# Patient Record
Sex: Female | Born: 1960 | Race: White | Hispanic: No | Smoking: Current every day smoker
Health system: Southern US, Community
[De-identification: ages and names within clinical notes are randomized; demographics above are authoritative.]

## PROBLEM LIST (undated history)

## (undated) DIAGNOSIS — F329 Major depressive disorder, single episode, unspecified: Secondary | ICD-10-CM

## (undated) DIAGNOSIS — K449 Diaphragmatic hernia without obstruction or gangrene: Secondary | ICD-10-CM

## (undated) DIAGNOSIS — G9332 Myalgic encephalomyelitis/chronic fatigue syndrome: Secondary | ICD-10-CM

## (undated) DIAGNOSIS — G608 Other hereditary and idiopathic neuropathies: Secondary | ICD-10-CM

## (undated) DIAGNOSIS — G4700 Insomnia, unspecified: Secondary | ICD-10-CM

## (undated) DIAGNOSIS — J9811 Atelectasis: Secondary | ICD-10-CM

## (undated) DIAGNOSIS — T7840XA Allergy, unspecified, initial encounter: Secondary | ICD-10-CM

## (undated) DIAGNOSIS — J40 Bronchitis, not specified as acute or chronic: Secondary | ICD-10-CM

## (undated) DIAGNOSIS — M8580 Other specified disorders of bone density and structure, unspecified site: Secondary | ICD-10-CM

## (undated) DIAGNOSIS — M533 Sacrococcygeal disorders, not elsewhere classified: Secondary | ICD-10-CM

## (undated) DIAGNOSIS — L309 Dermatitis, unspecified: Secondary | ICD-10-CM

## (undated) DIAGNOSIS — K219 Gastro-esophageal reflux disease without esophagitis: Secondary | ICD-10-CM

## (undated) DIAGNOSIS — G629 Polyneuropathy, unspecified: Secondary | ICD-10-CM

## (undated) DIAGNOSIS — M542 Cervicalgia: Secondary | ICD-10-CM

## (undated) DIAGNOSIS — M4802 Spinal stenosis, cervical region: Secondary | ICD-10-CM

## (undated) DIAGNOSIS — N390 Urinary tract infection, site not specified: Secondary | ICD-10-CM

## (undated) DIAGNOSIS — G3184 Mild cognitive impairment, so stated: Secondary | ICD-10-CM

## (undated) DIAGNOSIS — K529 Noninfective gastroenteritis and colitis, unspecified: Secondary | ICD-10-CM

## (undated) DIAGNOSIS — Z9889 Other specified postprocedural states: Secondary | ICD-10-CM

## (undated) DIAGNOSIS — F410 Panic disorder [episodic paroxysmal anxiety] without agoraphobia: Secondary | ICD-10-CM

## (undated) DIAGNOSIS — R928 Other abnormal and inconclusive findings on diagnostic imaging of breast: Secondary | ICD-10-CM

## (undated) DIAGNOSIS — F419 Anxiety disorder, unspecified: Secondary | ICD-10-CM

## (undated) DIAGNOSIS — D759 Disease of blood and blood-forming organs, unspecified: Secondary | ICD-10-CM

## (undated) DIAGNOSIS — M791 Myalgia, unspecified site: Secondary | ICD-10-CM

## (undated) DIAGNOSIS — T56891A Toxic effect of other metals, accidental (unintentional), initial encounter: Secondary | ICD-10-CM

## (undated) DIAGNOSIS — Z972 Presence of dental prosthetic device (complete) (partial): Secondary | ICD-10-CM

## (undated) DIAGNOSIS — R091 Pleurisy: Secondary | ICD-10-CM

## (undated) DIAGNOSIS — G43719 Chronic migraine without aura, intractable, without status migrainosus: Secondary | ICD-10-CM

## (undated) DIAGNOSIS — E559 Vitamin D deficiency, unspecified: Secondary | ICD-10-CM

## (undated) DIAGNOSIS — R519 Headache, unspecified: Secondary | ICD-10-CM

## (undated) DIAGNOSIS — N6001 Solitary cyst of right breast: Secondary | ICD-10-CM

## (undated) DIAGNOSIS — R51 Headache: Secondary | ICD-10-CM

## (undated) DIAGNOSIS — G4730 Sleep apnea, unspecified: Secondary | ICD-10-CM

## (undated) DIAGNOSIS — I10 Essential (primary) hypertension: Secondary | ICD-10-CM

## (undated) DIAGNOSIS — I739 Peripheral vascular disease, unspecified: Secondary | ICD-10-CM

## (undated) DIAGNOSIS — R296 Repeated falls: Secondary | ICD-10-CM

## (undated) DIAGNOSIS — M47812 Spondylosis without myelopathy or radiculopathy, cervical region: Secondary | ICD-10-CM

## (undated) DIAGNOSIS — R5382 Chronic fatigue, unspecified: Secondary | ICD-10-CM

## (undated) DIAGNOSIS — M51369 Other intervertebral disc degeneration, lumbar region without mention of lumbar back pain or lower extremity pain: Secondary | ICD-10-CM

## (undated) DIAGNOSIS — I890 Lymphedema, not elsewhere classified: Secondary | ICD-10-CM

## (undated) DIAGNOSIS — G251 Drug-induced tremor: Secondary | ICD-10-CM

## (undated) DIAGNOSIS — N926 Irregular menstruation, unspecified: Secondary | ICD-10-CM

## (undated) DIAGNOSIS — J189 Pneumonia, unspecified organism: Secondary | ICD-10-CM

## (undated) DIAGNOSIS — F32A Depression, unspecified: Secondary | ICD-10-CM

## (undated) DIAGNOSIS — Z720 Tobacco use: Secondary | ICD-10-CM

## (undated) DIAGNOSIS — M47816 Spondylosis without myelopathy or radiculopathy, lumbar region: Secondary | ICD-10-CM

## (undated) DIAGNOSIS — M1990 Unspecified osteoarthritis, unspecified site: Secondary | ICD-10-CM

## (undated) DIAGNOSIS — Z8489 Family history of other specified conditions: Secondary | ICD-10-CM

## (undated) DIAGNOSIS — L409 Psoriasis, unspecified: Secondary | ICD-10-CM

## (undated) DIAGNOSIS — I872 Venous insufficiency (chronic) (peripheral): Secondary | ICD-10-CM

## (undated) DIAGNOSIS — F17210 Nicotine dependence, cigarettes, uncomplicated: Secondary | ICD-10-CM

## (undated) DIAGNOSIS — F319 Bipolar disorder, unspecified: Secondary | ICD-10-CM

## (undated) DIAGNOSIS — F1020 Alcohol dependence, uncomplicated: Secondary | ICD-10-CM

## (undated) DIAGNOSIS — G8929 Other chronic pain: Secondary | ICD-10-CM

## (undated) DIAGNOSIS — I70219 Atherosclerosis of native arteries of extremities with intermittent claudication, unspecified extremity: Secondary | ICD-10-CM

## (undated) DIAGNOSIS — E785 Hyperlipidemia, unspecified: Secondary | ICD-10-CM

## (undated) DIAGNOSIS — D649 Anemia, unspecified: Secondary | ICD-10-CM

## (undated) DIAGNOSIS — M797 Fibromyalgia: Secondary | ICD-10-CM

## (undated) DIAGNOSIS — R0600 Dyspnea, unspecified: Secondary | ICD-10-CM

## (undated) DIAGNOSIS — J449 Chronic obstructive pulmonary disease, unspecified: Secondary | ICD-10-CM

## (undated) DIAGNOSIS — E039 Hypothyroidism, unspecified: Secondary | ICD-10-CM

## (undated) DIAGNOSIS — E538 Deficiency of other specified B group vitamins: Secondary | ICD-10-CM

## (undated) DIAGNOSIS — N6002 Solitary cyst of left breast: Secondary | ICD-10-CM

## (undated) DIAGNOSIS — R053 Chronic cough: Secondary | ICD-10-CM

## (undated) HISTORY — PX: BACK SURGERY: SHX140

## (undated) HISTORY — DX: Anxiety disorder, unspecified: F41.9

## (undated) HISTORY — PX: NASAL SINUS SURGERY: SHX719

## (undated) HISTORY — PX: COLONOSCOPY: SHX174

## (undated) HISTORY — PX: OOPHORECTOMY: SHX86

## (undated) HISTORY — PX: OTHER SURGICAL HISTORY: SHX169

## (undated) HISTORY — PX: NECK SURGERY: SHX720

## (undated) HISTORY — PX: ADENOIDECTOMY: SUR15

## (undated) HISTORY — DX: Urinary tract infection, site not specified: N39.0

## (undated) HISTORY — DX: Hyperlipidemia, unspecified: E78.5

## (undated) HISTORY — DX: Unspecified osteoarthritis, unspecified site: M19.90

## (undated) HISTORY — PX: SPINE SURGERY: SHX786

## (undated) HISTORY — DX: Bipolar disorder, unspecified: F31.9

## (undated) HISTORY — DX: Other abnormal and inconclusive findings on diagnostic imaging of breast: R92.8

## (undated) HISTORY — DX: Gastro-esophageal reflux disease without esophagitis: K21.9

## (undated) HISTORY — PX: TONSILLECTOMY: SUR1361

## (undated) HISTORY — DX: Chronic obstructive pulmonary disease, unspecified: J44.9

## (undated) HISTORY — DX: Other specified disorders of bone density and structure, unspecified site: M85.80

## (undated) HISTORY — PX: DILATION AND CURETTAGE OF UTERUS: SHX78

## (undated) HISTORY — DX: Major depressive disorder, single episode, unspecified: F32.9

## (undated) HISTORY — DX: Depression, unspecified: F32.A

## (undated) HISTORY — PX: ESOPHAGOGASTRODUODENOSCOPY: SHX1529

## (undated) HISTORY — PX: BREAST BIOPSY: SHX20

---

## 1994-08-29 HISTORY — PX: ABDOMINAL HYSTERECTOMY: SHX81

## 1996-08-29 HISTORY — PX: KNEE SURGERY: SHX244

## 1999-12-22 ENCOUNTER — Encounter: Payer: Self-pay | Admitting: Neurosurgery

## 1999-12-24 ENCOUNTER — Encounter: Payer: Self-pay | Admitting: Neurosurgery

## 1999-12-24 ENCOUNTER — Observation Stay (HOSPITAL_COMMUNITY): Admission: RE | Admit: 1999-12-24 | Discharge: 1999-12-25 | Payer: Self-pay | Admitting: Neurosurgery

## 2000-01-13 ENCOUNTER — Encounter: Admission: RE | Admit: 2000-01-13 | Discharge: 2000-01-13 | Payer: Self-pay | Admitting: Neurosurgery

## 2000-01-13 ENCOUNTER — Encounter: Payer: Self-pay | Admitting: Neurosurgery

## 2000-02-15 ENCOUNTER — Encounter: Payer: Self-pay | Admitting: Neurosurgery

## 2000-02-15 ENCOUNTER — Encounter: Admission: RE | Admit: 2000-02-15 | Discharge: 2000-02-15 | Payer: Self-pay | Admitting: Neurosurgery

## 2000-03-16 ENCOUNTER — Encounter: Payer: Self-pay | Admitting: Neurosurgery

## 2000-03-16 ENCOUNTER — Encounter: Admission: RE | Admit: 2000-03-16 | Discharge: 2000-03-16 | Payer: Self-pay | Admitting: Neurosurgery

## 2000-06-26 ENCOUNTER — Encounter: Payer: Self-pay | Admitting: Neurosurgery

## 2000-06-26 ENCOUNTER — Encounter: Admission: RE | Admit: 2000-06-26 | Discharge: 2000-06-26 | Payer: Self-pay | Admitting: Neurosurgery

## 2001-03-05 ENCOUNTER — Encounter: Payer: Self-pay | Admitting: Neurosurgery

## 2001-03-05 ENCOUNTER — Encounter: Admission: RE | Admit: 2001-03-05 | Discharge: 2001-03-05 | Payer: Self-pay | Admitting: Neurosurgery

## 2004-04-23 ENCOUNTER — Observation Stay (HOSPITAL_COMMUNITY): Admission: RE | Admit: 2004-04-23 | Discharge: 2004-04-24 | Payer: Self-pay | Admitting: Neurosurgery

## 2004-06-03 ENCOUNTER — Ambulatory Visit: Payer: Self-pay | Admitting: Pain Medicine

## 2004-06-30 ENCOUNTER — Ambulatory Visit: Payer: Self-pay | Admitting: Pain Medicine

## 2004-07-29 ENCOUNTER — Ambulatory Visit: Payer: Self-pay | Admitting: Pain Medicine

## 2004-08-31 ENCOUNTER — Ambulatory Visit: Payer: Self-pay | Admitting: Pain Medicine

## 2004-09-28 ENCOUNTER — Ambulatory Visit: Payer: Self-pay | Admitting: Pain Medicine

## 2004-10-28 ENCOUNTER — Ambulatory Visit: Payer: Self-pay | Admitting: Pain Medicine

## 2004-11-23 ENCOUNTER — Ambulatory Visit: Payer: Self-pay | Admitting: Pain Medicine

## 2004-12-28 ENCOUNTER — Ambulatory Visit: Payer: Self-pay | Admitting: Pain Medicine

## 2005-01-07 ENCOUNTER — Ambulatory Visit: Payer: Self-pay | Admitting: Family Medicine

## 2005-01-25 ENCOUNTER — Ambulatory Visit: Payer: Self-pay | Admitting: Pain Medicine

## 2005-02-21 ENCOUNTER — Ambulatory Visit: Payer: Self-pay | Admitting: Pain Medicine

## 2005-03-08 ENCOUNTER — Ambulatory Visit: Payer: Self-pay | Admitting: Pain Medicine

## 2005-03-16 ENCOUNTER — Ambulatory Visit: Payer: Self-pay | Admitting: Pain Medicine

## 2005-03-31 ENCOUNTER — Ambulatory Visit: Payer: Self-pay | Admitting: Pain Medicine

## 2005-04-19 ENCOUNTER — Ambulatory Visit: Payer: Self-pay | Admitting: Gastroenterology

## 2005-04-21 ENCOUNTER — Ambulatory Visit: Payer: Self-pay | Admitting: Pain Medicine

## 2005-05-12 ENCOUNTER — Ambulatory Visit: Payer: Self-pay | Admitting: Neurology

## 2005-05-26 ENCOUNTER — Ambulatory Visit: Payer: Self-pay | Admitting: Pain Medicine

## 2005-06-23 ENCOUNTER — Ambulatory Visit: Payer: Self-pay | Admitting: Pain Medicine

## 2005-07-28 ENCOUNTER — Ambulatory Visit: Payer: Self-pay | Admitting: Pain Medicine

## 2005-08-25 ENCOUNTER — Ambulatory Visit: Payer: Self-pay | Admitting: Pain Medicine

## 2005-09-20 ENCOUNTER — Ambulatory Visit: Payer: Self-pay | Admitting: Pain Medicine

## 2005-10-18 ENCOUNTER — Ambulatory Visit: Payer: Self-pay | Admitting: Pain Medicine

## 2005-11-02 ENCOUNTER — Ambulatory Visit: Payer: Self-pay | Admitting: Pain Medicine

## 2005-11-17 ENCOUNTER — Ambulatory Visit: Payer: Self-pay | Admitting: Pain Medicine

## 2005-12-07 ENCOUNTER — Ambulatory Visit: Payer: Self-pay | Admitting: Pain Medicine

## 2005-12-20 ENCOUNTER — Ambulatory Visit: Payer: Self-pay | Admitting: Pain Medicine

## 2006-01-18 ENCOUNTER — Ambulatory Visit: Payer: Self-pay | Admitting: Pain Medicine

## 2006-02-16 ENCOUNTER — Ambulatory Visit: Payer: Self-pay | Admitting: Pain Medicine

## 2006-03-16 ENCOUNTER — Ambulatory Visit: Payer: Self-pay | Admitting: Pain Medicine

## 2006-03-22 ENCOUNTER — Ambulatory Visit: Payer: Self-pay | Admitting: Pain Medicine

## 2006-04-13 ENCOUNTER — Ambulatory Visit: Payer: Self-pay | Admitting: Pain Medicine

## 2006-05-16 ENCOUNTER — Ambulatory Visit: Payer: Self-pay | Admitting: Pain Medicine

## 2006-05-18 ENCOUNTER — Ambulatory Visit: Payer: Self-pay | Admitting: Pain Medicine

## 2006-06-15 ENCOUNTER — Ambulatory Visit: Payer: Self-pay | Admitting: Pain Medicine

## 2006-07-18 ENCOUNTER — Ambulatory Visit: Payer: Self-pay | Admitting: Pain Medicine

## 2006-08-17 ENCOUNTER — Ambulatory Visit: Payer: Self-pay | Admitting: Pain Medicine

## 2006-09-26 ENCOUNTER — Ambulatory Visit: Payer: Self-pay | Admitting: Pain Medicine

## 2006-10-11 ENCOUNTER — Ambulatory Visit: Payer: Self-pay | Admitting: Pain Medicine

## 2006-11-14 ENCOUNTER — Ambulatory Visit: Payer: Self-pay | Admitting: Pain Medicine

## 2006-12-14 ENCOUNTER — Ambulatory Visit: Payer: Self-pay | Admitting: Pain Medicine

## 2006-12-20 ENCOUNTER — Ambulatory Visit: Payer: Self-pay | Admitting: Pain Medicine

## 2006-12-26 ENCOUNTER — Ambulatory Visit: Payer: Self-pay | Admitting: Pain Medicine

## 2007-01-09 ENCOUNTER — Ambulatory Visit: Payer: Self-pay | Admitting: Pain Medicine

## 2007-02-15 ENCOUNTER — Ambulatory Visit: Payer: Self-pay | Admitting: Pain Medicine

## 2007-03-20 ENCOUNTER — Ambulatory Visit: Payer: Self-pay | Admitting: Pain Medicine

## 2007-04-12 ENCOUNTER — Ambulatory Visit: Payer: Self-pay | Admitting: Pain Medicine

## 2007-05-22 ENCOUNTER — Ambulatory Visit: Payer: Self-pay | Admitting: Pain Medicine

## 2007-06-21 ENCOUNTER — Ambulatory Visit: Payer: Self-pay | Admitting: Pain Medicine

## 2007-06-25 ENCOUNTER — Ambulatory Visit: Payer: Self-pay | Admitting: Pain Medicine

## 2007-07-19 ENCOUNTER — Ambulatory Visit: Payer: Self-pay | Admitting: Pain Medicine

## 2007-08-28 ENCOUNTER — Ambulatory Visit: Payer: Self-pay | Admitting: Pain Medicine

## 2007-10-15 ENCOUNTER — Ambulatory Visit: Payer: Self-pay | Admitting: Pain Medicine

## 2007-11-08 ENCOUNTER — Ambulatory Visit: Payer: Self-pay | Admitting: Pain Medicine

## 2007-12-11 ENCOUNTER — Ambulatory Visit: Payer: Self-pay | Admitting: Pain Medicine

## 2008-01-17 ENCOUNTER — Ambulatory Visit: Payer: Self-pay | Admitting: Pain Medicine

## 2008-02-19 ENCOUNTER — Ambulatory Visit: Payer: Self-pay | Admitting: Pain Medicine

## 2008-02-27 ENCOUNTER — Ambulatory Visit: Payer: Self-pay | Admitting: Pain Medicine

## 2008-03-27 ENCOUNTER — Ambulatory Visit: Payer: Self-pay | Admitting: Pain Medicine

## 2008-04-29 ENCOUNTER — Ambulatory Visit: Payer: Self-pay | Admitting: Pain Medicine

## 2008-05-29 ENCOUNTER — Ambulatory Visit: Payer: Self-pay | Admitting: Pain Medicine

## 2008-07-03 ENCOUNTER — Ambulatory Visit: Payer: Self-pay | Admitting: Pain Medicine

## 2008-07-31 ENCOUNTER — Ambulatory Visit: Payer: Self-pay | Admitting: Pain Medicine

## 2008-08-25 ENCOUNTER — Ambulatory Visit: Payer: Self-pay | Admitting: Neurology

## 2008-08-29 HISTORY — PX: EYE SURGERY: SHX253

## 2008-09-02 ENCOUNTER — Ambulatory Visit: Payer: Self-pay | Admitting: Pain Medicine

## 2008-10-23 ENCOUNTER — Ambulatory Visit: Payer: Self-pay | Admitting: Pain Medicine

## 2008-11-25 ENCOUNTER — Ambulatory Visit: Payer: Self-pay | Admitting: Pain Medicine

## 2008-11-28 ENCOUNTER — Ambulatory Visit: Payer: Self-pay | Admitting: Neurology

## 2008-12-25 ENCOUNTER — Ambulatory Visit: Payer: Self-pay | Admitting: Pain Medicine

## 2009-01-22 ENCOUNTER — Ambulatory Visit: Payer: Self-pay | Admitting: Pain Medicine

## 2009-02-24 ENCOUNTER — Ambulatory Visit: Payer: Self-pay | Admitting: Pain Medicine

## 2009-03-26 ENCOUNTER — Ambulatory Visit: Payer: Self-pay | Admitting: Pain Medicine

## 2009-04-28 ENCOUNTER — Ambulatory Visit: Payer: Self-pay | Admitting: Pain Medicine

## 2009-05-28 ENCOUNTER — Ambulatory Visit: Payer: Self-pay | Admitting: Pain Medicine

## 2009-06-25 ENCOUNTER — Ambulatory Visit: Payer: Self-pay | Admitting: Pain Medicine

## 2009-07-28 ENCOUNTER — Ambulatory Visit: Payer: Self-pay | Admitting: Pain Medicine

## 2009-08-25 ENCOUNTER — Ambulatory Visit: Payer: Self-pay | Admitting: Pain Medicine

## 2009-09-02 ENCOUNTER — Ambulatory Visit: Payer: Self-pay | Admitting: Pain Medicine

## 2009-09-24 ENCOUNTER — Ambulatory Visit: Payer: Self-pay | Admitting: Pain Medicine

## 2009-10-22 ENCOUNTER — Ambulatory Visit: Payer: Self-pay | Admitting: Pain Medicine

## 2009-11-02 ENCOUNTER — Ambulatory Visit: Payer: Self-pay | Admitting: Pain Medicine

## 2009-11-24 ENCOUNTER — Ambulatory Visit: Payer: Self-pay | Admitting: Pain Medicine

## 2009-12-03 ENCOUNTER — Ambulatory Visit: Payer: Self-pay | Admitting: Gastroenterology

## 2009-12-09 ENCOUNTER — Ambulatory Visit: Payer: Self-pay | Admitting: Pain Medicine

## 2009-12-24 ENCOUNTER — Ambulatory Visit: Payer: Self-pay | Admitting: Pain Medicine

## 2010-01-21 ENCOUNTER — Ambulatory Visit: Payer: Self-pay | Admitting: Pain Medicine

## 2010-02-15 ENCOUNTER — Emergency Department: Payer: Self-pay | Admitting: Emergency Medicine

## 2010-02-22 ENCOUNTER — Emergency Department: Payer: Self-pay | Admitting: Emergency Medicine

## 2010-02-24 ENCOUNTER — Inpatient Hospital Stay: Payer: Self-pay | Admitting: Internal Medicine

## 2010-03-11 ENCOUNTER — Ambulatory Visit: Payer: Self-pay | Admitting: Pain Medicine

## 2010-03-12 ENCOUNTER — Ambulatory Visit: Payer: Self-pay | Admitting: Family Medicine

## 2010-04-06 ENCOUNTER — Ambulatory Visit: Payer: Self-pay | Admitting: Pain Medicine

## 2010-05-11 ENCOUNTER — Ambulatory Visit: Payer: Self-pay | Admitting: Pain Medicine

## 2010-06-02 ENCOUNTER — Ambulatory Visit: Payer: Self-pay | Admitting: Internal Medicine

## 2010-06-02 ENCOUNTER — Ambulatory Visit: Payer: Self-pay | Admitting: Pain Medicine

## 2010-06-08 ENCOUNTER — Ambulatory Visit: Payer: Self-pay | Admitting: Otolaryngology

## 2010-06-11 ENCOUNTER — Ambulatory Visit: Payer: Self-pay | Admitting: Family Medicine

## 2010-07-08 ENCOUNTER — Ambulatory Visit: Payer: Self-pay | Admitting: Pain Medicine

## 2010-08-03 ENCOUNTER — Ambulatory Visit: Payer: Self-pay | Admitting: Pain Medicine

## 2010-09-07 ENCOUNTER — Ambulatory Visit: Payer: Self-pay | Admitting: Pain Medicine

## 2010-09-20 ENCOUNTER — Ambulatory Visit: Payer: Self-pay | Admitting: Pain Medicine

## 2010-10-06 ENCOUNTER — Ambulatory Visit: Payer: Self-pay | Admitting: Pain Medicine

## 2010-10-27 ENCOUNTER — Ambulatory Visit: Payer: Self-pay | Admitting: Anesthesiology

## 2010-10-28 ENCOUNTER — Ambulatory Visit: Payer: Self-pay | Admitting: Otolaryngology

## 2010-11-01 LAB — PATHOLOGY REPORT

## 2010-11-09 ENCOUNTER — Ambulatory Visit: Payer: Self-pay | Admitting: Pain Medicine

## 2010-12-06 ENCOUNTER — Ambulatory Visit: Payer: Self-pay | Admitting: Physician Assistant

## 2010-12-09 ENCOUNTER — Ambulatory Visit: Payer: Self-pay | Admitting: Pain Medicine

## 2010-12-10 ENCOUNTER — Ambulatory Visit: Payer: Self-pay | Admitting: Pain Medicine

## 2010-12-10 ENCOUNTER — Ambulatory Visit: Payer: Self-pay | Admitting: Internal Medicine

## 2011-01-05 ENCOUNTER — Ambulatory Visit: Payer: Self-pay | Admitting: Pain Medicine

## 2011-02-15 ENCOUNTER — Ambulatory Visit: Payer: Self-pay | Admitting: Pain Medicine

## 2011-02-21 ENCOUNTER — Ambulatory Visit: Payer: Self-pay | Admitting: Pain Medicine

## 2011-03-14 ENCOUNTER — Ambulatory Visit: Payer: Self-pay | Admitting: Pain Medicine

## 2011-03-21 ENCOUNTER — Ambulatory Visit: Payer: Self-pay | Admitting: Pain Medicine

## 2011-04-05 ENCOUNTER — Ambulatory Visit: Payer: Self-pay | Admitting: Pain Medicine

## 2011-04-13 ENCOUNTER — Ambulatory Visit: Payer: Self-pay | Admitting: Pain Medicine

## 2011-04-21 ENCOUNTER — Ambulatory Visit: Payer: Self-pay | Admitting: Pain Medicine

## 2011-05-10 ENCOUNTER — Ambulatory Visit: Payer: Self-pay | Admitting: Pain Medicine

## 2011-06-01 ENCOUNTER — Inpatient Hospital Stay: Payer: Self-pay | Admitting: Psychiatry

## 2011-06-07 ENCOUNTER — Ambulatory Visit: Payer: Self-pay | Admitting: Unknown Physician Specialty

## 2011-06-09 ENCOUNTER — Ambulatory Visit: Payer: Self-pay | Admitting: Pain Medicine

## 2011-06-22 ENCOUNTER — Ambulatory Visit: Payer: Self-pay | Admitting: Pain Medicine

## 2011-06-30 ENCOUNTER — Ambulatory Visit: Payer: Self-pay | Admitting: Unknown Physician Specialty

## 2011-07-07 ENCOUNTER — Ambulatory Visit: Payer: Self-pay | Admitting: Pain Medicine

## 2011-07-13 ENCOUNTER — Ambulatory Visit: Payer: Self-pay | Admitting: Internal Medicine

## 2011-08-08 ENCOUNTER — Ambulatory Visit: Payer: Self-pay | Admitting: Gastroenterology

## 2011-08-09 ENCOUNTER — Ambulatory Visit: Payer: Self-pay | Admitting: Pain Medicine

## 2011-08-12 ENCOUNTER — Ambulatory Visit: Payer: Self-pay | Admitting: Gastroenterology

## 2011-09-05 ENCOUNTER — Ambulatory Visit: Payer: Self-pay | Admitting: Pain Medicine

## 2011-09-14 ENCOUNTER — Ambulatory Visit: Payer: Self-pay | Admitting: Gastroenterology

## 2011-10-04 ENCOUNTER — Ambulatory Visit: Payer: Self-pay | Admitting: Orthopedic Surgery

## 2011-10-04 LAB — HEMOGLOBIN: HGB: 12.1 g/dL (ref 12.0–16.0)

## 2011-10-10 ENCOUNTER — Ambulatory Visit: Payer: Self-pay | Admitting: Pain Medicine

## 2011-11-01 ENCOUNTER — Ambulatory Visit: Payer: Self-pay | Admitting: Orthopedic Surgery

## 2011-11-08 ENCOUNTER — Ambulatory Visit: Payer: Self-pay | Admitting: Pain Medicine

## 2011-11-21 ENCOUNTER — Ambulatory Visit: Payer: Self-pay | Admitting: Pain Medicine

## 2011-12-05 ENCOUNTER — Ambulatory Visit: Payer: Self-pay | Admitting: Specialist

## 2011-12-07 ENCOUNTER — Ambulatory Visit: Payer: Self-pay | Admitting: Pain Medicine

## 2012-01-05 ENCOUNTER — Ambulatory Visit: Payer: Self-pay | Admitting: Pain Medicine

## 2012-02-01 ENCOUNTER — Ambulatory Visit: Payer: Self-pay | Admitting: Pain Medicine

## 2012-03-06 ENCOUNTER — Ambulatory Visit: Payer: Self-pay | Admitting: Pain Medicine

## 2012-04-03 ENCOUNTER — Ambulatory Visit: Payer: Self-pay | Admitting: Pain Medicine

## 2012-04-09 ENCOUNTER — Ambulatory Visit: Payer: Self-pay | Admitting: Pain Medicine

## 2012-05-03 ENCOUNTER — Ambulatory Visit: Payer: Self-pay | Admitting: Pain Medicine

## 2012-05-29 ENCOUNTER — Ambulatory Visit: Payer: Self-pay | Admitting: Pain Medicine

## 2012-06-06 ENCOUNTER — Ambulatory Visit: Payer: Self-pay | Admitting: Pain Medicine

## 2012-07-03 ENCOUNTER — Ambulatory Visit: Payer: Self-pay | Admitting: Pain Medicine

## 2012-07-11 ENCOUNTER — Ambulatory Visit: Payer: Self-pay | Admitting: Pain Medicine

## 2012-07-17 ENCOUNTER — Ambulatory Visit: Payer: Self-pay

## 2012-08-01 ENCOUNTER — Ambulatory Visit: Payer: Self-pay | Admitting: Neurology

## 2012-08-02 ENCOUNTER — Ambulatory Visit: Payer: Self-pay | Admitting: Pain Medicine

## 2012-08-08 ENCOUNTER — Ambulatory Visit: Payer: Self-pay | Admitting: Internal Medicine

## 2012-08-09 ENCOUNTER — Ambulatory Visit: Payer: Self-pay | Admitting: Internal Medicine

## 2012-08-20 ENCOUNTER — Ambulatory Visit: Payer: Self-pay | Admitting: General Surgery

## 2012-08-21 HISTORY — PX: BREAST BIOPSY: SHX20

## 2012-08-28 ENCOUNTER — Ambulatory Visit: Payer: Self-pay | Admitting: Pain Medicine

## 2012-09-25 ENCOUNTER — Ambulatory Visit: Payer: Self-pay | Admitting: Pain Medicine

## 2012-10-30 ENCOUNTER — Ambulatory Visit: Payer: Self-pay | Admitting: Pain Medicine

## 2012-11-07 ENCOUNTER — Ambulatory Visit: Payer: Self-pay | Admitting: Pain Medicine

## 2012-11-14 ENCOUNTER — Ambulatory Visit: Payer: Self-pay | Admitting: Pain Medicine

## 2012-11-27 ENCOUNTER — Ambulatory Visit: Payer: Self-pay | Admitting: Pain Medicine

## 2012-12-20 ENCOUNTER — Encounter: Payer: Self-pay | Admitting: General Surgery

## 2012-12-27 ENCOUNTER — Ambulatory Visit: Payer: Self-pay | Admitting: Pain Medicine

## 2013-01-29 ENCOUNTER — Ambulatory Visit: Payer: Self-pay | Admitting: Pain Medicine

## 2013-02-12 ENCOUNTER — Ambulatory Visit: Payer: Self-pay | Admitting: General Surgery

## 2013-02-13 ENCOUNTER — Encounter: Payer: Self-pay | Admitting: General Surgery

## 2013-02-14 ENCOUNTER — Ambulatory Visit: Payer: Self-pay | Admitting: General Surgery

## 2013-02-20 ENCOUNTER — Encounter: Payer: Self-pay | Admitting: General Surgery

## 2013-02-20 ENCOUNTER — Ambulatory Visit (INDEPENDENT_AMBULATORY_CARE_PROVIDER_SITE_OTHER): Payer: Medicare Other | Admitting: General Surgery

## 2013-02-20 VITALS — BP 120/80 | HR 68 | Resp 12 | Ht 62.5 in | Wt 174.0 lb

## 2013-02-20 DIAGNOSIS — D249 Benign neoplasm of unspecified breast: Secondary | ICD-10-CM

## 2013-02-20 NOTE — Patient Instructions (Addendum)
Continue self breast checks 6 month mammogram and office visit

## 2013-02-20 NOTE — Progress Notes (Signed)
Patient ID: Sabrina Stanley, female   DOB: [birth year], 52 y.o.   MRN: [id number]  Chief Complaint  Patient presents with  . Follow-up    mammogram    HPI Sabrina Stanley is a 52 y.o. female.  Who presents for a follow up right breast evaluation. The most recent right mammogram was done on 02-07-13. Patient had a right breast biopsy 08-21-12 fibroadenoma with calcifications. Patient does perform regular self breast checks and gets regular mammograms done. Maternal great aunt with family history of breast cancer.   HPI  Past Medical History  Diagnosis Date  . Abnormal mammogram, unspecified   . UTI (urinary tract infection)     Past Surgical History  Procedure Laterality Date  . Spine surgery  2006, 2008    cervical fusion  . Abdominal hysterectomy  1996  . Eye surgery  2010  . Dilation and curettage of uterus  1979, 1980  . Nasal sinus surgery  1998, 2000  . Knee surgery Right 1998    meniscus repair  . Breast biopsy Right 08-21-12    Family History  Problem Relation Age of Onset  . Breast cancer Maternal Aunt     great aunt  . Lung cancer Paternal Uncle   . Lung cancer Father   . Cervical cancer Maternal Grandmother     Social History History  Substance Use Topics  . Smoking status: Current Every Day Smoker -- 1.00 packs/day for 35 years    Types: Cigarettes  . Smokeless tobacco: Never Used  . Alcohol Use: No    Allergies  Allergen Reactions  . Asa (Aspirin)     Stomach pain  . Darvocet (Propoxyphene-Acetaminophen)     Stomach pain  . Effexor (Venlafaxine Hcl) Other (See Comments)    hallucinations  . Neurontin (Gabapentin)     fatigue  . Mobic (Meloxicam) Palpitations  . Penicillins Rash    Current Outpatient Prescriptions  Medication Sig Dispense Refill  . ABILIFY 5 MG tablet 5 mg daily.       . CRESTOR 40 MG tablet Take 40 mg by mouth daily.       . divalproex (DEPAKOTE ER) 250 MG 24 hr tablet Take 500 mg by mouth daily.       Marland Kitchen gabapentin  (NEURONTIN) 300 MG capsule Take 300 mg by mouth daily.      Marland Kitchen morphine (MS CONTIN) 15 MG 12 hr tablet Take 15 mg by mouth 2 (two) times daily.      Marland Kitchen rOPINIRole (REQUIP) 1 MG tablet Take 1 mg by mouth 3 (three) times daily.      . SUMAtriptan (IMITREX) 100 MG tablet Take 100 mg by mouth every 2 (two) hours as needed for migraine.      Marland Kitchen tiZANidine (ZANAFLEX) 4 MG capsule Take 4 mg by mouth 3 (three) times daily.      Marland Kitchen zolpidem (AMBIEN) 10 MG tablet Take 10 mg by mouth daily.        No current facility-administered medications for this visit.    Review of Systems Review of Systems  Constitutional: Negative.   Respiratory: Negative.   Cardiovascular: Negative.     Blood pressure 120/80, pulse 68, resp. rate 12, height 5' 2.5" (1.588 m), weight 174 lb (78.926 kg).  Physical Exam Physical Exam  Constitutional: She is oriented to person, place, and time. She appears well-developed and well-nourished.  Pulmonary/Chest: Right breast exhibits no inverted nipple, no mass, no nipple discharge, no skin change and no tenderness.  Left breast exhibits no inverted nipple, no mass, no nipple discharge, no skin change and no tenderness.  Lymphadenopathy:    She has no cervical adenopathy.    She has no axillary adenopathy.  Neurological: She is alert and oriented to person, place, and time.  Skin: Skin is warm and dry.    Data Reviewed Right mammogram reviewed, clip in place. No new findings.  Assessment    Fibroadenoma right breast    Plan   6 month follow up.       Josslynn Mentzer G 02/24/2013, 9:49 AM

## 2013-02-24 ENCOUNTER — Encounter: Payer: Self-pay | Admitting: General Surgery

## 2013-02-24 DIAGNOSIS — D249 Benign neoplasm of unspecified breast: Secondary | ICD-10-CM | POA: Insufficient documentation

## 2013-02-26 ENCOUNTER — Ambulatory Visit: Payer: Self-pay | Admitting: Pain Medicine

## 2013-03-27 ENCOUNTER — Ambulatory Visit: Payer: Self-pay | Admitting: Pain Medicine

## 2013-04-15 ENCOUNTER — Ambulatory Visit: Payer: Self-pay | Admitting: Pain Medicine

## 2013-04-23 ENCOUNTER — Ambulatory Visit: Payer: Self-pay | Admitting: Pain Medicine

## 2013-05-20 ENCOUNTER — Ambulatory Visit: Payer: Self-pay | Admitting: Pain Medicine

## 2013-06-27 ENCOUNTER — Ambulatory Visit: Payer: Self-pay | Admitting: Pain Medicine

## 2013-07-30 ENCOUNTER — Ambulatory Visit: Payer: Self-pay | Admitting: Pain Medicine

## 2013-08-13 ENCOUNTER — Ambulatory Visit: Payer: Self-pay | Admitting: Internal Medicine

## 2013-08-14 ENCOUNTER — Ambulatory Visit: Payer: Self-pay | Admitting: Pain Medicine

## 2013-08-14 ENCOUNTER — Encounter: Payer: Self-pay | Admitting: General Surgery

## 2013-08-27 ENCOUNTER — Ambulatory Visit: Payer: Self-pay | Admitting: Pain Medicine

## 2013-09-02 ENCOUNTER — Ambulatory Visit (INDEPENDENT_AMBULATORY_CARE_PROVIDER_SITE_OTHER): Payer: Medicare Other | Admitting: General Surgery

## 2013-09-02 VITALS — BP 110/72 | HR 88 | Resp 14 | Ht 62.0 in | Wt 172.0 lb

## 2013-09-02 DIAGNOSIS — D241 Benign neoplasm of right breast: Secondary | ICD-10-CM

## 2013-09-02 DIAGNOSIS — D249 Benign neoplasm of unspecified breast: Secondary | ICD-10-CM

## 2013-09-02 NOTE — Progress Notes (Signed)
Patient ID: Sabrina Stanley, female   DOB: 01-28-1961, 53 y.o.   MRN: [id number]  Chief Complaint  Patient presents with  . Follow-up    mammogram    HPI Sabrina Stanley is a 53 y.o. female who presents for a breast evaluation. The most recent mammogram was done on 08/13/13 at [facility]. Patient has a history of right breast biopsy 08-21-12 fibroadenoma with calcifications.  Patient does perform regular self breast checks and gets regular mammograms done. Patient denies any new breast symptoms.     HPI  Past Medical History  Diagnosis Date  . Abnormal mammogram, unspecified   . UTI (urinary tract infection)     Past Surgical History  Procedure Laterality Date  . Spine surgery  2006, 2008    cervical fusion  . Abdominal hysterectomy  1996  . Eye surgery  2010  . Dilation and curettage of uterus  1979, 1980  . Nasal sinus surgery  1998, 2000  . Knee surgery Right 1998    meniscus repair  . Breast biopsy Right 08-21-12    Family History  Problem Relation Age of Onset  . Breast cancer Maternal Aunt     great aunt  . Lung cancer Paternal Uncle   . Lung cancer Father   . Cervical cancer Maternal Grandmother     Social History History  Substance Use Topics  . Smoking status: Current Every Day Smoker -- 1.00 packs/day for 35 years    Types: Cigarettes  . Smokeless tobacco: Never Used  . Alcohol Use: No    Allergies  Allergen Reactions  . Asa [Aspirin]     Stomach pain  . Darvocet [Propoxyphene N-Acetaminophen]     Stomach pain  . Effexor [Venlafaxine Hcl] Other (See Comments)    hallucinations  . Neurontin [Gabapentin]     fatigue  . Nsaids     Bad stomach ache   . Mobic [Meloxicam] Palpitations  . Penicillins Rash    Current Outpatient Prescriptions  Medication Sig Dispense Refill  . ABILIFY 5 MG tablet 5 mg daily.       . CRESTOR 40 MG tablet Take 40 mg by mouth daily.       . divalproex (DEPAKOTE ER) 250 MG 24 hr tablet Take 500 mg by mouth daily.       Marland Kitchen  morphine (MS CONTIN) 15 MG 12 hr tablet Take 15 mg by mouth 2 (two) times daily.      Marland Kitchen oxyCODONE-acetaminophen (PERCOCET/ROXICET) 5-325 MG per tablet Take 1 tablet by mouth every 4 (four) hours as needed for severe pain.      Marland Kitchen rOPINIRole (REQUIP) 1 MG tablet Take 1 mg by mouth 3 (three) times daily.      . SUMAtriptan (IMITREX) 100 MG tablet Take 100 mg by mouth every 2 (two) hours as needed for migraine.      Marland Kitchen tiZANidine (ZANAFLEX) 4 MG capsule Take 4 mg by mouth 3 (three) times daily.      Marland Kitchen zolpidem (AMBIEN) 10 MG tablet Take 10 mg by mouth daily.        No current facility-administered medications for this visit.    Review of Systems Review of Systems  Constitutional: Negative.   Respiratory: Negative.   Cardiovascular: Negative.     Blood pressure 110/72, pulse 88, resp. rate 14, height 5\' 2"  (1.575 m), weight 172 lb (78.019 kg).  Physical Exam Physical Exam  Constitutional: She is oriented to person, place, and time. She appears well-developed and  well-nourished.  Neck: Neck supple.  Cardiovascular: Normal rate, regular rhythm and normal heart sounds.   Pulmonary/Chest: Effort normal and breath sounds normal. Right breast exhibits no inverted nipple, no mass, no nipple discharge, no skin change and no tenderness. Left breast exhibits no inverted nipple, no mass, no nipple discharge, no skin change and no tenderness.  Lymphadenopathy:    She has no cervical adenopathy.    She has no axillary adenopathy.  Neurological: She is alert and oriented to person, place, and time.    Data Reviewed Mammogram reviewed. Stable findings  Assessment    Fibroadenoma right breast. Stable exam     Plan    Return to yearly screening mammogram        Shandra Szymborski G 09/04/2013, 8:18 AM

## 2013-09-02 NOTE — Patient Instructions (Addendum)
Continue self breast exams. Call office for any new breast issues or concerns. 

## 2013-09-04 ENCOUNTER — Encounter: Payer: Self-pay | Admitting: General Surgery

## 2013-09-09 ENCOUNTER — Ambulatory Visit: Payer: Self-pay | Admitting: Pain Medicine

## 2013-09-26 ENCOUNTER — Ambulatory Visit: Payer: Self-pay | Admitting: Pain Medicine

## 2013-10-22 ENCOUNTER — Ambulatory Visit: Payer: Self-pay | Admitting: Pain Medicine

## 2013-11-20 ENCOUNTER — Ambulatory Visit: Payer: Self-pay | Admitting: Pain Medicine

## 2013-12-24 ENCOUNTER — Ambulatory Visit: Payer: Self-pay | Admitting: Pain Medicine

## 2014-01-16 ENCOUNTER — Ambulatory Visit: Payer: Self-pay | Admitting: Pain Medicine

## 2014-02-25 ENCOUNTER — Ambulatory Visit: Payer: Self-pay | Admitting: Pain Medicine

## 2014-03-26 ENCOUNTER — Ambulatory Visit: Payer: Self-pay | Admitting: Pain Medicine

## 2014-04-24 ENCOUNTER — Ambulatory Visit: Payer: Self-pay | Admitting: Pain Medicine

## 2014-04-30 ENCOUNTER — Ambulatory Visit: Payer: Self-pay | Admitting: Pain Medicine

## 2014-05-21 ENCOUNTER — Ambulatory Visit: Payer: Self-pay | Admitting: Pain Medicine

## 2014-06-23 ENCOUNTER — Ambulatory Visit: Payer: Self-pay | Admitting: Orthopedic Surgery

## 2014-06-23 LAB — POTASSIUM: POTASSIUM: 4.7 mmol/L (ref 3.5–5.1)

## 2014-06-26 ENCOUNTER — Ambulatory Visit: Payer: Self-pay | Admitting: Pain Medicine

## 2014-06-30 ENCOUNTER — Encounter: Payer: Self-pay | Admitting: General Surgery

## 2014-07-08 ENCOUNTER — Ambulatory Visit: Payer: Self-pay | Admitting: Orthopedic Surgery

## 2014-07-21 ENCOUNTER — Ambulatory Visit: Payer: Self-pay | Admitting: Pain Medicine

## 2014-08-19 ENCOUNTER — Ambulatory Visit: Payer: Self-pay | Admitting: Pain Medicine

## 2014-09-18 ENCOUNTER — Ambulatory Visit: Payer: Self-pay | Admitting: Pain Medicine

## 2014-09-18 ENCOUNTER — Ambulatory Visit: Payer: Medicare Other | Admitting: General Surgery

## 2014-09-18 ENCOUNTER — Encounter: Payer: Self-pay | Admitting: General Surgery

## 2014-09-22 ENCOUNTER — Ambulatory Visit: Payer: Self-pay | Admitting: General Surgery

## 2014-10-02 ENCOUNTER — Encounter: Payer: Self-pay | Admitting: General Surgery

## 2014-10-02 ENCOUNTER — Ambulatory Visit (INDEPENDENT_AMBULATORY_CARE_PROVIDER_SITE_OTHER): Payer: Medicare Other | Admitting: General Surgery

## 2014-10-02 VITALS — BP 110/78 | HR 80 | Resp 14 | Ht 62.5 in | Wt 177.0 lb

## 2014-10-02 DIAGNOSIS — D241 Benign neoplasm of right breast: Secondary | ICD-10-CM

## 2014-10-02 NOTE — Progress Notes (Signed)
Patient ID: Sabrina Stanley, female   DOB: [birth year], 54 y.o.   MRN: [id number]  Chief Complaint  Patient presents with  . Follow-up    mammogram     HPI Sabrina Stanley is a 54 y.o. female who presents for a breast evaluation. The most recent mammogram was done on 09/16/14. Patient does perform regular self breast checks and gets regular mammograms done. She denies any new problems with the breasts at this time.    HPI  Past Medical History  Diagnosis Date  . Abnormal mammogram, unspecified   . UTI (urinary tract infection)     Past Surgical History  Procedure Laterality Date  . Spine surgery  2006, 2008    cervical fusion  . Abdominal hysterectomy  1996  . Eye surgery  2010  . Dilation and curettage of uterus  1979, 1980  . Nasal sinus surgery  1998, 2000  . Knee surgery Right 1998    meniscus repair  . Breast biopsy Right 08-21-12    Family History  Problem Relation Age of Onset  . Breast cancer Maternal Aunt     great aunt  . Lung cancer Paternal Uncle   . Lung cancer Father   . Cervical cancer Maternal Grandmother     Social History History  Substance Use Topics  . Smoking status: Current Every Day Smoker -- 1.00 packs/day for 35 years    Types: Cigarettes  . Smokeless tobacco: Never Used  . Alcohol Use: No    Allergies  Allergen Reactions  . Asa [Aspirin]     Stomach pain  . Darvocet [Propoxyphene N-Acetaminophen]     Stomach pain  . Effexor [Venlafaxine Hcl] Other (See Comments)    hallucinations  . Neurontin [Gabapentin]     fatigue  . Nsaids     Bad stomach ache   . Mobic [Meloxicam] Palpitations  . Penicillins Rash    Current Outpatient Prescriptions  Medication Sig Dispense Refill  . ABILIFY 5 MG tablet 5 mg daily.     . CRESTOR 40 MG tablet Take 40 mg by mouth daily.     . divalproex (DEPAKOTE ER) 250 MG 24 hr tablet Take 500 mg by mouth daily.     Marland Kitchen morphine (MS CONTIN) 15 MG 12 hr tablet Take 15 mg by mouth 2 (two) times daily.    Marland Kitchen  oxyCODONE-acetaminophen (PERCOCET/ROXICET) 5-325 MG per tablet Take 1 tablet by mouth every 4 (four) hours as needed for severe pain.    Marland Kitchen rOPINIRole (REQUIP) 1 MG tablet Take 1 mg by mouth 3 (three) times daily.    . SUMAtriptan (IMITREX) 100 MG tablet Take 100 mg by mouth every 2 (two) hours as needed for migraine.    Marland Kitchen tiZANidine (ZANAFLEX) 4 MG capsule Take 4 mg by mouth 3 (three) times daily.    Marland Kitchen zolpidem (AMBIEN) 10 MG tablet Take 10 mg by mouth daily.      No current facility-administered medications for this visit.    Review of Systems Review of Systems  Constitutional: Negative.   Respiratory: Negative.   Cardiovascular: Negative.     Blood pressure 110/78, pulse 80, resp. rate 14, height 5' 2.5" (1.588 m), weight 177 lb (80.287 kg).  Physical Exam Physical Exam  Constitutional: She is oriented to person, place, and time. She appears well-developed and well-nourished.  Eyes: Conjunctivae are normal. No scleral icterus.  Neck: Neck supple. No thyromegaly present.  Cardiovascular: Normal rate, regular rhythm and normal heart sounds.  Pulmonary/Chest: Effort normal and breath sounds normal. Right breast exhibits no inverted nipple, no mass, no nipple discharge, no skin change and no tenderness. Left breast exhibits no inverted nipple, no mass, no nipple discharge, no skin change and no tenderness.  Abdominal: Soft. There is no tenderness.  Lymphadenopathy:    She has no cervical adenopathy.    She has no axillary adenopathy.  Neurological: She is alert and oriented to person, place, and time.  Skin: Skin is warm and dry.    Data Reviewed Mammogram reviewed.Stable findings.  Assessment    Stable exam. Fibroadenoma right breast by prior stereotactic biopsy. Stable over a 2 year period.     Plan    Patient to have yearly mammograms arranged through PCP. Discussed the importance of self exam. Return here as needed.        Tennelle Taflinger G 10/03/2014, 8:46  AM

## 2014-10-02 NOTE — Patient Instructions (Signed)
Continue self breast exams. Call office for any new breast issues or concerns. 

## 2014-10-03 ENCOUNTER — Encounter: Payer: Self-pay | Admitting: General Surgery

## 2014-10-21 ENCOUNTER — Ambulatory Visit: Payer: Self-pay | Admitting: Pain Medicine

## 2014-11-20 ENCOUNTER — Ambulatory Visit: Payer: Self-pay | Admitting: Pain Medicine

## 2014-12-17 ENCOUNTER — Ambulatory Visit
Admit: 2014-12-17 | Disposition: A | Discharge status: Home / Self Care | Payer: Self-pay | Attending: Pain Medicine | Admitting: Pain Medicine

## 2014-12-20 NOTE — Op Note (Signed)
PATIENT NAME:  Sabrina, Stanley MR#:  [id number] DATE OF BIRTH:  [birth year]  DATE OF PROCEDURE:  07/08/2014  PREOPERATIVE DIAGNOSIS: Right cubital tunnel syndrome.   POSTOPERATIVE DIAGNOSIS: Right carpal tunnel syndrome.   PROCEDURE: Right submuscular ulnar nerve transposition.   ANESTHESIA: General.   SURGEON: Laurene Footman, MD   DESCRIPTION OF PROCEDURE: The patient was brought to the operating room and after adequate anesthesia was obtained, the right arm was prepped and draped in the usual sterile fashion. A tourniquet applied to the upper arm; after patient identification and timeout procedure completed. An incision was made centered over the medial epicondyle extending proximally and distally approximately 4 to 5 cm. The skin and subcutaneous tissue were split, preserving cutaneous nerves as much as possible. The cubital tunnel was then identified and opened at the level of the medial epicondyle, then going proximal; then distal, the nerve had an hourglass constriction at the level of the medial epicondyle. After adequate release, proximal and distal, the nerve could be mobilized. After this had been done the intermuscular septum was resected proximally to prevent impingement in that area. Distally, the nerve was brought forward and some fibrous septations anteriorly were also released to prevent kinking. The common flexor origin was divided in a Z type fashion to allow repair without tension over the nerve. The nerve was placed anteriorly under the muscle and the muscle repaired.   With the elbow placed through a range of motion there was no excessive tension or evidence of pressure on the nerve. The wound was then thoroughly irrigated, tourniquet let down and hemostasis checked with electrocautery. The wound was then closed with a 2-0 Vicryl subcutaneously and 4-0 nylon for the skin. Xeroform, 4 x 4's, Webril and a posterior splint were then applied followed by a sling.   The patient was sent  to the recovery room in stable condition.   ESTIMATED BLOOD LOSS: Minimal.   COMPLICATIONS: None.   SPECIMEN: None.   TOURNIQUET TIME: Was 32 minutes at 250 mmHg.    ____________________________ Laurene Footman, MD mjm:nt D: 07/08/2014 65:03:54 ET T: 07/08/2014 23:39:45 ET JOB#: [id number]  cc: Laurene Footman, MD, <Dictator> Laurene Footman MD ELECTRONICALLY SIGNED 07/09/2014 7:15

## 2014-12-21 NOTE — Op Note (Signed)
PATIENT NAME:  Sabrina Stanley, Sabrina Stanley MR#:  [id number] DATE OF BIRTH:  [birth year]  DATE OF PROCEDURE:  11/01/2011  PREOPERATIVE DIAGNOSES:  1. Left cubital tunnel syndrome.  2. Ulnar neuritis.   POSTOPERATIVE DIAGNOSES:  1. Left cubital tunnel syndrome. 2. Ulnar neuritis.   PROCEDURE: Left ulnar nerve submuscular transposition, left elbow.   ANESTHESIA: General.   SURGEON: Laurene Footman, MD   DESCRIPTION OF PROCEDURE: The patient was brought to the operating room and after adequate anesthesia was obtained the left arm was prepped and draped in the usual sterile fashion. Sterile tourniquet was available but not required. After appropriate patient identification and time-out procedures were carried out, the elbow was approached with a Learmonth incision. Subcutaneous tissue was spread and the flexor origin identified. Next, going more posteriorly subcutaneous nerve branches were spared as much as possible. The ulnar nerve was identified and released through the cubital tunnel. The intermuscular septum was released. On mobilization there was an approximately 1 cm area of hourglass constriction of the nerve consistent with significant compression. Release was carried out to the head of the FCU with adequate mobilization of the nerve so it could be moved anteriorly. The flexor mass was incised in a Z-type fashion for subsequent repair. The nerve was placed down to the level of the bone anteriorly. The tendon was repaired using an 0 Vicryl suture. The elbow was placed through a range of motion. There was no kinking or undue pressure on the nerve. At this point, the wound was again thoroughly irrigated and then closed with 2-0 Vicryl subcutaneously and 4-0 nylon in a simple interrupted fashion. Xeroform, 4 x 4's, and a posterior splint were applied to hold the elbow in 90 degrees flexion followed by an Ace wrap. The patient was sent to the recovery room in stable condition.   ESTIMATED BLOOD LOSS: 50.    COMPLICATIONS: None.   SPECIMENS: None.    ____________________________ Laurene Footman, MD mjm:drc D: 11/01/2011 20:14:24 ET T: 11/02/2011 08:33:28 ET JOB#: [id number]  cc: Laurene Footman, MD, <Dictator> Laurene Footman MD ELECTRONICALLY SIGNED 11/03/2011 7:24

## 2015-01-14 ENCOUNTER — Encounter (INDEPENDENT_AMBULATORY_CARE_PROVIDER_SITE_OTHER): Payer: Self-pay

## 2015-01-14 ENCOUNTER — Ambulatory Visit: Payer: Medicare Other | Attending: Pain Medicine | Admitting: Pain Medicine

## 2015-01-14 ENCOUNTER — Encounter: Payer: Self-pay | Admitting: Pain Medicine

## 2015-01-14 VITALS — BP 111/59 | HR 88 | Temp 98.3°F | Resp 16 | Ht 62.0 in | Wt 178.0 lb

## 2015-01-14 DIAGNOSIS — M47816 Spondylosis without myelopathy or radiculopathy, lumbar region: Secondary | ICD-10-CM

## 2015-01-14 DIAGNOSIS — M461 Sacroiliitis, not elsewhere classified: Secondary | ICD-10-CM

## 2015-01-14 DIAGNOSIS — M5030 Other cervical disc degeneration, unspecified cervical region: Secondary | ICD-10-CM | POA: Insufficient documentation

## 2015-01-14 DIAGNOSIS — Z9889 Other specified postprocedural states: Secondary | ICD-10-CM | POA: Insufficient documentation

## 2015-01-14 DIAGNOSIS — M5136 Other intervertebral disc degeneration, lumbar region: Secondary | ICD-10-CM

## 2015-01-14 DIAGNOSIS — M545 Low back pain: Secondary | ICD-10-CM | POA: Diagnosis present

## 2015-01-14 DIAGNOSIS — M47812 Spondylosis without myelopathy or radiculopathy, cervical region: Secondary | ICD-10-CM | POA: Diagnosis not present

## 2015-01-14 DIAGNOSIS — Q761 Klippel-Feil syndrome: Secondary | ICD-10-CM

## 2015-01-14 DIAGNOSIS — M542 Cervicalgia: Secondary | ICD-10-CM | POA: Diagnosis present

## 2015-01-14 MED ORDER — OXYCODONE-ACETAMINOPHEN 5-325 MG PO TABS: ORAL_TABLET | ORAL | Status: DC

## 2015-01-14 MED ORDER — MORPHINE SULFATE ER 15 MG PO TBCR: EXTENDED_RELEASE_TABLET | ORAL | Status: DC

## 2015-01-14 NOTE — Patient Instructions (Addendum)
Smoking Cessation Quitting smoking is important to your health and has many advantages. However, it is not always easy to quit since nicotine is a very addictive drug. Oftentimes, people try 3 times or more before being able to quit. This document explains the best ways for you to prepare to quit smoking. Quitting takes hard work and a lot of effort, but you can do it. ADVANTAGES OF QUITTING SMOKING  You will live longer, feel better, and live better.  Your body will feel the impact of quitting smoking almost immediately.  Within 20 minutes, blood pressure decreases. Your pulse returns to its normal level.  After 8 hours, carbon monoxide levels in the blood return to normal. Your oxygen level increases.  After 24 hours, the chance of having a heart attack starts to decrease. Your breath, hair, and body stop smelling like smoke.  After 48 hours, damaged nerve endings begin to recover. Your sense of taste and smell improve.  After 72 hours, the body is virtually free of nicotine. Your bronchial tubes relax and breathing becomes easier.  After 2 to 12 weeks, lungs can hold more air. Exercise becomes easier and circulation improves.  The risk of having a heart attack, stroke, cancer, or lung disease is greatly reduced.  After 1 year, the risk of coronary heart disease is cut in half.  After 5 years, the risk of stroke falls to the same as a nonsmoker.  After 10 years, the risk of lung cancer is cut in half and the risk of other cancers decreases significantly.  After 15 years, the risk of coronary heart disease drops, usually to the level of a nonsmoker.  If you are pregnant, quitting smoking will improve your chances of having a healthy baby.  The people you live with, especially any children, will be healthier.  You will have extra money to spend on things other than cigarettes. QUESTIONS TO THINK ABOUT BEFORE ATTEMPTING TO QUIT You may want to talk about your answers with your  health care provider.  Why do you want to quit?  If you tried to quit in the past, what helped and what did not?  What will be the most difficult situations for you after you quit? How will you plan to handle them?  Who can help you through the tough times? Your family? Friends? A health care provider?  What pleasures do you get from smoking? What ways can you still get pleasure if you quit? Here are some questions to ask your health care provider:  How can you help me to be successful at quitting?  What medicine do you think would be best for me and how should I take it?  What should I do if I need more help?  What is smoking withdrawal like? How can I get information on withdrawal? GET READY  Set a quit date.  Change your environment by getting rid of all cigarettes, ashtrays, matches, and lighters in your home, car, or work. Do not let people smoke in your home.  Review your past attempts to quit. Think about what worked and what did not. GET SUPPORT AND ENCOURAGEMENT You have a better chance of being successful if you have help. You can get support in many ways.  Tell your family, friends, and coworkers that you are going to quit and need their support. Ask them not to smoke around you.  Get individual, group, or telephone counseling and support. Programs are available at local hospitals and health centers. Call   your local health department for information about programs in your area.  Spiritual beliefs and practices may help some smokers quit.  Download a "quit meter" on your computer to keep track of quit statistics, such as how long you have gone without smoking, cigarettes not smoked, and money saved.  Get a self-help book about quitting smoking and staying off tobacco. [place] yourself from urges to smoke. Talk to someone, go for a walk, or occupy your time with a task.  Change your normal routine. Take a different route to work.  Drink tea instead of coffee. Eat breakfast in a different place.  Reduce your stress. Take a hot bath, exercise, or read a book.  Plan something enjoyable to do every day. Reward yourself for not smoking.  Explore interactive web-based programs that specialize in helping you quit. GET MEDICINE AND USE IT CORRECTLY Medicines can help you stop smoking and decrease the urge to smoke. Combining medicine with the above behavioral methods and support can greatly increase your chances of successfully quitting smoking.  Nicotine replacement therapy helps deliver nicotine to your body without the negative effects and risks of smoking. Nicotine replacement therapy includes nicotine gum, lozenges, inhalers, nasal sprays, and skin patches. Some may be available over-the-counter and others require a prescription.  Antidepressant medicine helps people abstain from smoking, but how this works is unknown. This medicine is available by prescription.  Nicotinic receptor partial agonist medicine simulates the effect of nicotine in your brain. This medicine is available by prescription. Ask your health care provider for advice about which medicines to use and how to use them based on your health history. Your health care provider will tell you what side effects to look out for if you choose to be on a medicine or therapy. Carefully read the information on the package. Do not use any other product containing nicotine while using a nicotine replacement product.  RELAPSE OR DIFFICULT SITUATIONS Most relapses occur within the first 3 months after quitting. Do not be discouraged if you start smoking again. Remember, most people try several times before finally quitting. You may have symptoms of withdrawal because your body is used to nicotine. You may crave cigarettes, be irritable, feel very hungry, cough often, get headaches, or have difficulty concentrating. The withdrawal symptoms are only temporary. They are strongest  when you first quit, but they will go away within 10-14 days. To reduce the chances of relapse, try to:  Avoid drinking alcohol. Drinking lowers your chances of successfully quitting.  Reduce the amount of caffeine you consume. Once you quit smoking, the amount of caffeine in your body increases and can give you symptoms, such as a rapid heartbeat, sweating, and anxiety.  Avoid smokers because they can make you want to smoke.  Do not let weight gain distract you. Many smokers will gain weight when they quit, usually less than 10 pounds. Eat a healthy diet and stay active. You can always lose the weight gained after you quit.  Find ways to improve your mood other than smoking. FOR MORE INFORMATION  www.smokefree.gov  Document Released: 08/09/2001 Document Revised: 12/30/2013 Document Reviewed: 11/24/2011 [facility] Patient Information 2015 [place], [place]. This information is not intended to replace advice given to you by your health care provider. Make sure you discuss any questions you have with your health care provider. Continue present medications.  F/U PCP for evaliation of  BP and general medical  condition. MS Contin and oxycodone cemented  F/U surgical evaluation.  F/U neurological evaluation.  May consider radiofrequency rhizolysis or intraspinal procedures pending response to present treatment and F/U evaluation.  [facility] to call [facility] should patient have concerns prior to scheduled return appointment.

## 2015-01-14 NOTE — Progress Notes (Signed)
   Subjective:    Patient ID: Sabrina Stanley, female    DOB: 25-Mar-1961, 54 y.o.   MRN: [id number]  HPI    Review of Systems     Objective:   Physical Exam        Assessment & Plan:

## 2015-01-14 NOTE — Progress Notes (Signed)
Ambulatory to [facility] at 1316pm with scripts  Of morphine  And oxycodone no urine

## 2015-01-14 NOTE — Progress Notes (Signed)
   Subjective:    Patient ID: Sabrina Stanley, female    DOB: 19-Mar-1961, 54 y.o.   MRN: [id number]  HPI     Patient is 54 year old female returns to pain management for further evaluation and treatment of pain involving the neck and upper extremity regions and lower back and lower extremity regions.  there has been concern regarding swelling of the lower extremities which appear to be fairly well-controlled at this time we will avoid interventional treatment and continue present medications     . Review of Systems     Objective:   Physical Exam  tenderness of the splenius capitis muscles noted as well as tenderness of the simple talus muscles. Was tenderness over the cervical facet region with well-healed surgical scar the cervical region without increased warmth or erythema in the region of the scar. Patient of the acromioclavicular and glenohumeral joint regions reproduced mild discomfort with slightly decreased grip strength noted. Tinel's and Phalen's maneuver associated with increased pain of mild degree there was tenderness over the thoracic facet thoracic paraspinal musculature region of moderate degree in the lower thoracic region. Over the lumbar paraspinal muscles and lumbar facet region regions reproduced severe pain with lateral bending and rotation reducing severe discomfort. There was tenderness over the gluteal and piriformis muscles on the left as well as on the right and straight leg raising was tolerated to approximately 20. There appeared to be 1+ lower extremity edema of the lower extremities with negative clonus and negative Homans  And no increased warmth or erythema of the extremities noted      Assessment & Plan:     degenerative disc disease cervical spine    spondylitic changes C3-C7, status post surgical intervention of the cervical region, with pain fairly stable at this time      degenerative disc disease lumbar spine   L3-4 L4-5 and L5-S1 degenerative  changes prominent    sacroiliac joint dysfunction    lumbar facet syndrome       plan   Continue present medications. MS Contin and oxycodone  F/U PCP for evaliation of  BP and general medical  condition.  F/U surgical evaluation.  F/U neurological evaluation.  May consider radiofrequency rhizolysis or intraspinal procedures pending response to present treatment and F/U evaluation.  Patient to call [facility] should patient have concerns prior to scheduled return appointment.

## 2015-01-15 ENCOUNTER — Encounter: Payer: Self-pay | Admitting: Pain Medicine

## 2015-02-11 ENCOUNTER — Ambulatory Visit: Payer: Medicare Other | Attending: Pain Medicine | Admitting: Pain Medicine

## 2015-02-11 ENCOUNTER — Encounter: Payer: Self-pay | Admitting: Pain Medicine

## 2015-02-11 VITALS — BP 123/69 | HR 92 | Temp 97.8°F | Resp 16 | Ht 62.0 in | Wt 184.0 lb

## 2015-02-11 DIAGNOSIS — M542 Cervicalgia: Secondary | ICD-10-CM | POA: Diagnosis present

## 2015-02-11 DIAGNOSIS — M47816 Spondylosis without myelopathy or radiculopathy, lumbar region: Secondary | ICD-10-CM | POA: Insufficient documentation

## 2015-02-11 DIAGNOSIS — Z981 Arthrodesis status: Secondary | ICD-10-CM | POA: Diagnosis not present

## 2015-02-11 DIAGNOSIS — I890 Lymphedema, not elsewhere classified: Secondary | ICD-10-CM | POA: Diagnosis not present

## 2015-02-11 DIAGNOSIS — M5030 Other cervical disc degeneration, unspecified cervical region: Secondary | ICD-10-CM | POA: Diagnosis not present

## 2015-02-11 DIAGNOSIS — M533 Sacrococcygeal disorders, not elsewhere classified: Secondary | ICD-10-CM | POA: Diagnosis not present

## 2015-02-11 DIAGNOSIS — M79602 Pain in left arm: Secondary | ICD-10-CM | POA: Diagnosis present

## 2015-02-11 DIAGNOSIS — M5136 Other intervertebral disc degeneration, lumbar region: Secondary | ICD-10-CM | POA: Diagnosis not present

## 2015-02-11 DIAGNOSIS — M79601 Pain in right arm: Secondary | ICD-10-CM | POA: Diagnosis present

## 2015-02-11 MED ORDER — MORPHINE SULFATE ER 15 MG PO TBCR: EXTENDED_RELEASE_TABLET | ORAL | Status: DC

## 2015-02-11 MED ORDER — OXYCODONE-ACETAMINOPHEN 5-325 MG PO TABS: ORAL_TABLET | ORAL | Status: DC

## 2015-02-11 NOTE — Progress Notes (Signed)
   Subjective:    Patient ID: Sabrina Stanley, female    DOB: 1960-11-07, 54 y.o.   MRN: [id number]  HPI  Patient is 54 year old female returns to [place] for further evaluation and treatment of pain involving the region of the neck upper extremity region and the lower back and lower extremity regions. Patient is without recent trauma change in events of daily living the cause change in symptomatology. On today's visit patient was noted to have some return of significant lower extremity swelling. We addressed the lower extremity swelling again on today's visit and have encouraged patient to wear support stockings and to elevate lower extremities when possible. We will also discussed pneumatic compression device to be worn at home with consideration for use during sleep. She'll undergo follow-up vascular evaluation and we will consider additional modification of treatment pending follow-up evaluation  Review of Systems     Objective:   Physical Exam  There was tenderness over splenius capitis occipitalis musculature region of mild degree with well-healed surgical scar the cervical region without increased warmth or erythema in the region of the scar. There appeared to be unremarkable Spurling's maneuver. Tinel and Phalen's maneuver were without increase of pain of significant degree. Patient appeared to be with slightly decreased grip strength. Palpation of the thoracic facet thoracic paraspinal musculature region was a tends to palpation of moderate degree with no crepitus of the thoracic region noted. Palpation over the lumbar paraspinal musculature region was a tends to palpation with extension and palpation of the lumbar facets reproducing moderate discomfort. Palpation over the PSIS and PII S regions were with tends to palpation of moderate degree. There was tenderness to palpation of the greater trochanteric region iliotibial band region a moderate degree as well. Straight leg raising  limited to approximately 20 without an increase of pain with dorsiflexion noted. There as negative clonus and negative Homans. The lower extremities were with edema without increased warmth or erythema of the lower extremities noted. As mentioned there was negative Homans. There were no new lesions of the lower extremity is noted. Abdomen nontender and no costovertebral angle tenderness noted.      Assessment & Plan:  Degenerative disc disease cervical spine Status post cervical fusion  Degenerative disc disease lumbar spine Lumbar facet syndrome L3-4, L4-5, and L5-S1 degenerative changes most significant  Sacroiliac joint dysfunction  Lymphedema of lower extremities    Plan  Continue present medications. MS Contin and oxycodone acetaminophen  F/U PCP for evaliation of  BP and general medical  condition.  F/U surgical evaluation.  F/U vascular evaluation of lower extremities  F/U neurological evaluation.  May consider radiofrequency rhizolysis or intraspinal procedures pending response to present treatment and F/U evaluation.  Patient to call [facility] should patient have concerns prior to scheduled return appointment.

## 2015-02-11 NOTE — Patient Instructions (Addendum)
Continue present medications.  F/U PCP for evaliation of  BP and general medical  condition.  F/U surgical evaluation.  F/U neurological evaluation.  May consider radiofrequency rhizolysis or intraspinal procedures pending response to present treatment and F/U evaluation.  Patient to call [facility] Center should patient have concerns prior to scheduled return appointment. Smoking Cessation Quitting smoking is important to your health and has many advantages. However, it is not always easy to quit since nicotine is a very addictive drug. Oftentimes, people try 3 times or more before being able to quit. This document explains the best ways for you to prepare to quit smoking. Quitting takes hard work and a lot of effort, but you can do it. ADVANTAGES OF QUITTING SMOKING 1. You will live longer, feel better, and live better. 2. Your body will feel the impact of quitting smoking almost immediately. 1. Within 20 minutes, blood pressure decreases. Your pulse returns to its normal level. 2. After 8 hours, carbon monoxide levels in the blood return to normal. Your oxygen level increases. 3. After 24 hours, the chance of having a heart attack starts to decrease. Your breath, hair, and body stop smelling like smoke. 4. After 48 hours, damaged nerve endings begin to recover. Your sense of taste and smell improve. 5. After 72 hours, the body is virtually free of nicotine. Your bronchial tubes relax and breathing becomes easier. 6. After 2 to 12 weeks, lungs can hold more air. Exercise becomes easier and circulation improves. 3. The risk of having a heart attack, stroke, cancer, or lung disease is greatly reduced. 1. After 1 year, the risk of coronary heart disease is cut in half. 2. After 5 years, the risk of stroke falls to the same as a nonsmoker. 3. After 10 years, the risk of lung cancer is cut in half and the risk of other cancers decreases significantly. 4. After 15 years, the risk of  coronary heart disease drops, usually to the level of a nonsmoker. 4. If you are pregnant, quitting smoking will improve your chances of having a healthy baby. 5. The people you live with, especially any children, will be healthier. 6. You will have extra money to spend on things other than cigarettes. QUESTIONS TO THINK ABOUT BEFORE ATTEMPTING TO QUIT You may want to talk about your answers with your health care provider. 1. Why do you want to quit? 2. If you tried to quit in the past, what helped and what did not? 3. What will be the most difficult situations for you after you quit? How will you plan to handle them? 4. Who can help you through the tough times? Your family? Friends? A health care provider? 5. What pleasures do you get from smoking? What ways can you still get pleasure if you quit? Here are some questions to ask your health care provider: 1. How can you help me to be successful at quitting? 2. What medicine do you think would be best for me and how should I take it? 3. What should I do if I need more help? 4. What is smoking withdrawal like? How can I get information on withdrawal? GET READY 1. Set a quit date. 2. Change your environment by getting rid of all cigarettes, ashtrays, matches, and lighters in your home, car, or work. Do not let people smoke in your home. 3. Review your past attempts to quit. Think about what worked and what did not. GET SUPPORT AND ENCOURAGEMENT You have a better chance of  being successful if you have help. You can get support in many ways.  Tell your family, friends, and coworkers that you are going to quit and need their support. Ask them not to smoke around you.  Get individual, group, or telephone counseling and support. Programs are available at [employer] and health centers. Call your local health department for information about programs in your area.  Spiritual beliefs and practices may help some smokers quit.  Download a "quit  meter" on your computer to keep track of quit statistics, such as how long you have gone without smoking, cigarettes not smoked, and money saved.  Get a self-help book about quitting smoking and staying off tobacco. [place] yourself from urges to smoke. Talk to someone, go for a walk, or occupy your time with a task.  Change your normal routine. Take a different route to work. Drink tea instead of coffee. Eat breakfast in a different place.  Reduce your stress. Take a hot bath, exercise, or read a book.  Plan something enjoyable to do every day. Reward yourself for not smoking.  Explore interactive web-based programs that specialize in helping you quit. GET MEDICINE AND USE IT CORRECTLY Medicines can help you stop smoking and decrease the urge to smoke. Combining medicine with the above behavioral methods and support can greatly increase your chances of successfully quitting smoking.  Nicotine replacement therapy helps deliver nicotine to your body without the negative effects and risks of smoking. Nicotine replacement therapy includes nicotine gum, lozenges, inhalers, nasal sprays, and skin patches. Some may be available over-the-counter and others require a prescription.  Antidepressant medicine helps people abstain from smoking, but how this works is unknown. This medicine is available by prescription.  Nicotinic receptor partial agonist medicine simulates the effect of nicotine in your brain. This medicine is available by prescription. Ask your health care provider for advice about which medicines to use and how to use them based on your health history. Your health care provider will tell you what side effects to look out for if you choose to be on a medicine or therapy. Carefully read the information on the package. Do not use any other product containing nicotine while using a nicotine replacement product.  RELAPSE OR DIFFICULT SITUATIONS Most relapses  occur within the first 3 months after quitting. Do not be discouraged if you start smoking again. Remember, most people try several times before finally quitting. You may have symptoms of withdrawal because your body is used to nicotine. You may crave cigarettes, be irritable, feel very hungry, cough often, get headaches, or have difficulty concentrating. The withdrawal symptoms are only temporary. They are strongest when you first quit, but they will go away within 10-14 days. To reduce the chances of relapse, try to:  Avoid drinking alcohol. Drinking lowers your chances of successfully quitting.  Reduce the amount of caffeine you consume. Once you quit smoking, the amount of caffeine in your body increases and can give you symptoms, such as a rapid heartbeat, sweating, and anxiety.  Avoid smokers because they can make you want to smoke.  Do not let weight gain distract you. Many smokers will gain weight when they quit, usually less than 10 pounds. Eat a healthy diet and stay active. You can always lose the weight gained after you quit.  Find ways to improve your mood other than smoking. FOR MORE INFORMATION  www.smokefree.gov  Document Released: 08/09/2001 Document Revised: 12/30/2013 Document Reviewed: 11/24/2011 ExitCare  Patient Information 2015 [place]. This information is not intended to replace advice given to you by your health care provider. Make sure you discuss any questions you have with your health care provider. Epidural Steroid Injection Patient Information  Description: The epidural space surrounds the nerves as they exit the spinal cord.  In some patients, the nerves can be compressed and inflamed by a bulging disc or a tight spinal canal (spinal stenosis).  By injecting steroids into the epidural space, we can bring irritated nerves into direct contact with a potentially helpful medication.  These steroids act directly on the irritated nerves and can reduce swelling and  inflammation which often leads to decreased pain.  Epidural steroids may be injected anywhere along the spine and from the neck to the low back depending upon the location of your pain.   After numbing the skin with local anesthetic (like Novocaine), a small needle is passed into the epidural space slowly.  You may experience a sensation of pressure while this is being done.  The entire block usually last less than 10 minutes.  Conditions which may be treated by epidural steroids:   Low back and leg pain  Neck and arm pain  Spinal stenosis  Post-laminectomy syndrome  Herpes zoster (shingles) pain  Pain from compression fractures  Preparation for the injection:  6. Do not eat any solid food or dairy products within 6 hours of your appointment.  7. You may drink clear liquids up to 2 hours before appointment.  Clear liquids include water, black coffee, juice or soda.  No milk or cream please. 8. You may take your regular medication, including pain medications, with a sip of water before your appointment  Diabetics should hold regular insulin (if taken separately) and take 1/2 normal NPH dos the morning of the procedure.  Carry some sugar containing items with you to your appointment. 9. A driver must accompany you and be prepared to drive you home after your procedure.  10. Bring all your current medications with your. 11. An IV may be inserted and sedation may be given at the discretion of the physician.   12. A blood pressure cuff, EKG and other monitors will often be applied during the procedure.  Some patients may need to have extra oxygen administered for a short period. 13. You will be asked to provide medical information, including your allergies, prior to the procedure.  We must know immediately if you are taking blood thinners (like Coumadin/Warfarin)  Or if you are allergic to IV iodine contrast (dye). We must know if you could possible be pregnant.  Possible  side-effects:  Bleeding from needle site  Infection (rare, may require surgery)  Nerve injury (rare)  Numbness & tingling (temporary)  Difficulty urinating (rare, temporary)  Spinal headache ( a headache worse with upright posture)  Light -headedness (temporary)  Pain at injection site (several days)  Decreased blood pressure (temporary)  Weakness in arm/leg (temporary)  Pressure sensation in back/neck (temporary)  Call if you experience:  Fever/chills associated with headache or increased back/neck pain.  Headache worsened by an upright position.  New onset weakness or numbness of an extremity below the injection site  Hives or difficulty breathing (go to the emergency room)  Inflammation or drainage at the infection site  Severe back/neck pain  Any new symptoms which are concerning to you  Please note:  Although the local anesthetic injected can often make your back or neck feel good for several hours after  the injection, the pain will likely return.  It takes 3-7 days for steroids to work in the epidural space.  You may not notice any pain relief for at least that one week.  If effective, we will often do a series of three injections spaced 3-6 weeks apart to maximally decrease your pain.  After the initial series, we generally will wait several months before considering a repeat injection of the same type.  If you have any questions, please call [phone] [facility]

## 2015-02-11 NOTE — Progress Notes (Signed)
Discharged to home at 901am with scripts in hand

## 2015-02-11 NOTE — Progress Notes (Signed)
Safety precautions to be maintained throughout the outpatient stay will include: orient to surroundings, keep bed in low position, maintain call bell within reach at all times, provide assistance with transfer out of bed and ambulation.  

## 2015-03-12 ENCOUNTER — Encounter: Payer: Self-pay | Admitting: Pain Medicine

## 2015-03-12 ENCOUNTER — Ambulatory Visit: Payer: Medicare Other | Attending: Pain Medicine | Admitting: Pain Medicine

## 2015-03-12 VITALS — BP 116/76 | HR 79 | Temp 97.8°F | Resp 16 | Ht 62.0 in | Wt 184.0 lb

## 2015-03-12 DIAGNOSIS — M533 Sacrococcygeal disorders, not elsewhere classified: Secondary | ICD-10-CM | POA: Diagnosis not present

## 2015-03-12 DIAGNOSIS — M5030 Other cervical disc degeneration, unspecified cervical region: Secondary | ICD-10-CM | POA: Diagnosis not present

## 2015-03-12 DIAGNOSIS — I890 Lymphedema, not elsewhere classified: Secondary | ICD-10-CM | POA: Diagnosis not present

## 2015-03-12 DIAGNOSIS — M79601 Pain in right arm: Secondary | ICD-10-CM | POA: Diagnosis present

## 2015-03-12 DIAGNOSIS — M79602 Pain in left arm: Secondary | ICD-10-CM | POA: Diagnosis present

## 2015-03-12 DIAGNOSIS — Z981 Arthrodesis status: Secondary | ICD-10-CM | POA: Diagnosis not present

## 2015-03-12 DIAGNOSIS — M47816 Spondylosis without myelopathy or radiculopathy, lumbar region: Secondary | ICD-10-CM | POA: Diagnosis not present

## 2015-03-12 DIAGNOSIS — M5136 Other intervertebral disc degeneration, lumbar region: Secondary | ICD-10-CM | POA: Insufficient documentation

## 2015-03-12 DIAGNOSIS — M542 Cervicalgia: Secondary | ICD-10-CM | POA: Diagnosis present

## 2015-03-12 DIAGNOSIS — G5622 Lesion of ulnar nerve, left upper limb: Secondary | ICD-10-CM | POA: Diagnosis not present

## 2015-03-12 MED ORDER — MORPHINE SULFATE ER 15 MG PO TBCR: EXTENDED_RELEASE_TABLET | ORAL | Status: DC

## 2015-03-12 MED ORDER — OXYCODONE-ACETAMINOPHEN 5-325 MG PO TABS: ORAL_TABLET | ORAL | Status: DC

## 2015-03-12 NOTE — Progress Notes (Signed)
Discharged to home ambulatory with script in hand for oxycodone and MS contin.

## 2015-03-12 NOTE — Progress Notes (Signed)
Safety precautions to be maintained throughout the outpatient stay will include: orient to surroundings, keep bed in low position, maintain call bell within reach at all times, provide assistance with transfer out of bed and ambulation.  

## 2015-03-12 NOTE — Patient Instructions (Addendum)
Continue present medications MS Contin and oxycodone acetaminophen  Radiofrequency rhizolysis of the lumbar facet, medial branch nerves, to be performed in August as discussed  F/U PCP Dr. Doy Hutching for evaliation of  BP and general medical  condition.  F/U surgical evaluation  F/U neurological evaluation  May consider radiofrequency rhizolysis or intraspinal procedures pending response to present treatment and F/U evaluation.  Patient to call [facility] Management Center should patient have concerns prior to scheduled return appointment.

## 2015-03-12 NOTE — Progress Notes (Signed)
   Subjective:    Patient ID: Sabrina Stanley, female    DOB: 01/03/1961, 54 y.o.   MRN: [id number]  HPI  Patient 54 year old female returns to [place] for further evaluation and treatment of pain involving the neck upper extremity regions as well as the lower back and lower extremity region. On today's visit patient provided [place] with documentation of patient being approved for steroid injections of the lumbar region for treatment of her lower back and lower extremity pain. We discussed patient's condition. Patient is with chronic lymphedema of the lower extremities. We discussed interventional treatment for the lower back and lower extremity pain and feel that patient may be appropriate candidate for radiofrequency rhizolysis of the lumbar facet, medial branch nerves. We will continue medications as prescribed and will request approval for radiofrequency rhizolysis of the lumbar facet, medial branch nerves, at this time. The patient was understanding and in agreement with suggested treatment plan      Review of Systems     Objective:   Physical Exam  there was tenderness of the splenius capitis and occipitalis musculature regions of mild to moderate degree. There appeared to be unremarkable Spurling's maneuver. There was well-healed surgical scar of the cervical region without increased warmth or erythema in the region of the scar. Palpation over the region thoracic facet thoracic paraspinal muscular region reproduced pain of mild to moderate degree. No crepitus of the thoracic region was noted. Patient appeared to be with slightly decreased grip strength. Tinel and Phalen's maneuver were without increase of pain of significant degree. Palpation of the lumbar paraspinal muscles region lumbar facet region was a tends to palpation of moderate moderately severe degree. Lateral bending and rotation and extension and palpation of the lumbar facets reproduce moderate moderately severe  discomfort. There was tends to palpation of the PSIS and PII S region a moderate degree. Palpation of the greater trochanteric region iliotibial band region was without significant tenderness to palpation. Straight leg raising was tolerates approximately 20 without a definite increase of pain with dorsiflexion noted lower extremities were with evidence of stasis dermatitis changes and chronic lymphedema of the lower extremities. There was negative clonus negative Homans. No definite sensory deficit of dermatomal disc disease was intact in the lower extremity region. Abdomen was soft nontender and no costovertebral angle tenderness was noted.    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 Doy Hutching  for evaliation of  BP and general medical  condition.  F/U surgical evaluation  F/U vascular evaluation  F/U neurological evaluation  May consider radiofrequency rhizolysis or intraspinal procedures pending response to present treatment and F/U evaluation.  Patient to call Pain Management Center should patient have concerns prior to scheduled return appointment. MS Contin and Percocet

## 2015-03-17 ENCOUNTER — Other Ambulatory Visit: Payer: Self-pay | Admitting: Pain Medicine

## 2015-03-17 DIAGNOSIS — M5030 Other cervical disc degeneration, unspecified cervical region: Secondary | ICD-10-CM

## 2015-03-17 DIAGNOSIS — M533 Sacrococcygeal disorders, not elsewhere classified: Secondary | ICD-10-CM

## 2015-03-17 DIAGNOSIS — M47812 Spondylosis without myelopathy or radiculopathy, cervical region: Secondary | ICD-10-CM

## 2015-03-17 DIAGNOSIS — M47816 Spondylosis without myelopathy or radiculopathy, lumbar region: Secondary | ICD-10-CM

## 2015-03-17 DIAGNOSIS — M5136 Other intervertebral disc degeneration, lumbar region: Secondary | ICD-10-CM

## 2015-03-26 ENCOUNTER — Other Ambulatory Visit: Payer: Self-pay | Admitting: Pain Medicine

## 2015-04-14 ENCOUNTER — Ambulatory Visit: Payer: Medicare Other | Attending: Pain Medicine | Admitting: Pain Medicine

## 2015-04-14 ENCOUNTER — Encounter: Payer: Self-pay | Admitting: Pain Medicine

## 2015-04-14 VITALS — BP 122/70 | HR 101 | Temp 98.3°F | Resp 18 | Ht 62.0 in | Wt 182.0 lb

## 2015-04-14 DIAGNOSIS — I890 Lymphedema, not elsewhere classified: Secondary | ICD-10-CM | POA: Insufficient documentation

## 2015-04-14 DIAGNOSIS — Z981 Arthrodesis status: Secondary | ICD-10-CM | POA: Diagnosis not present

## 2015-04-14 DIAGNOSIS — M545 Low back pain: Secondary | ICD-10-CM | POA: Diagnosis present

## 2015-04-14 DIAGNOSIS — M47816 Spondylosis without myelopathy or radiculopathy, lumbar region: Secondary | ICD-10-CM | POA: Diagnosis not present

## 2015-04-14 DIAGNOSIS — G5620 Lesion of ulnar nerve, unspecified upper limb: Secondary | ICD-10-CM | POA: Diagnosis not present

## 2015-04-14 DIAGNOSIS — M5030 Other cervical disc degeneration, unspecified cervical region: Secondary | ICD-10-CM | POA: Insufficient documentation

## 2015-04-14 DIAGNOSIS — M5136 Other intervertebral disc degeneration, lumbar region: Secondary | ICD-10-CM

## 2015-04-14 DIAGNOSIS — M79605 Pain in left leg: Secondary | ICD-10-CM | POA: Diagnosis present

## 2015-04-14 DIAGNOSIS — M79604 Pain in right leg: Secondary | ICD-10-CM | POA: Diagnosis present

## 2015-04-14 DIAGNOSIS — M533 Sacrococcygeal disorders, not elsewhere classified: Secondary | ICD-10-CM | POA: Diagnosis not present

## 2015-04-14 MED ORDER — MORPHINE SULFATE ER 15 MG PO TBCR: EXTENDED_RELEASE_TABLET | ORAL | Status: DC

## 2015-04-14 MED ORDER — OXYCODONE-ACETAMINOPHEN 5-325 MG PO TABS: ORAL_TABLET | ORAL | Status: DC

## 2015-04-14 NOTE — Patient Instructions (Signed)
Continue present medications morphine sulfate extended release and oxycodone  F/U PCP Dr. Doy Hutching for evaliation of  BP and general medical  condition  F/U surgical evaluation  F/U neurological evaluation  F/U vascular evaluation  May consider radiofrequency rhizolysis or intraspinal procedures pending response to present treatment and F/U evaluation   Patient to call Pain Management Center should patient have concerns prior to scheduled return appointmen.

## 2015-04-14 NOTE — Progress Notes (Signed)
Safety precautions to be maintained throughout the outpatient stay will include: orient to surroundings, keep bed in low position, maintain call bell within reach at all times, provide assistance with transfer out of bed and ambulation.  

## 2015-04-14 NOTE — Progress Notes (Signed)
Subjective:    Patient ID: Sabrina Stanley, female    DOB: 1960/12/19, 54 y.o.   MRN: [id number]  HPI  Patient is 54 year old female returns to [facility] for further evaluation and treatment of pain involving the lower back and lower extremity region predominantly patient with history of pain of the cervical upper extremity region with prior surgical intervention of the cervical region. History of ulnar neuropathy. At the present time patient states lower back lower extremity pain is aggravated by standing walking twisting turning maneuvers with pain becoming more intense as the day progresses. Patient also with lower extremity swelling which has been U weighted at the present time patient without plans for interventional treatment for the lower extremity swelling. We discussed patient's condition and have discussed wearing support stockings. We will also discussed stasis dermatitis changes of the lower extremities and have advised patient to attempt to elevate feet and to exercise the extremities in attempt to promote circulation and hopefully decrease the amount of stasis of the blood in the lower extremities to prevent the development of stasis dermatitis and discoloration of the lower extremities. He was understanding and in agreement with suggested treatment plan. The patient may also benefit from a pneumatic basal compressive device to be worn during the evening. At the present time we will continue present medications and consider patient for modification of treatment should there be significant change in condition.     Review of Systems     Objective:   Physical Exam   Palpation of the splenius capitis and occipitalis musketry region was with mild tends to palpation there was well-healed surgical scar of the cervical region without increased warmth or erythema in the region scar palpation of the acromioclavicular and glenohumeral joint regions were without increase of severe  pain. There appeared to be decreased grip strength and Tinel and Phalen's maneuver were without increase of pain of significant degree palpation over the thoracic facet thoracic paraspinal musculature region was a tends to palpation of mild to moderate degree especially the lower thoracic region and no crepitus of the thoracic region was noted. Patient appeared to be with unremarkable Spurling's maneuver. Palpation over the lumbar paraspinal muscles region lumbar facet region was a tends to palpation of moderate degree. Lateral bending rotation and extension and palpation of the lumbar facets reproduce moderate discomfort. Straight leg raising limited to approximately 20 without increase of pain with dorsiflexion noted. Stasis dermatitis changes of the lower extremities were noted with the distal third of the lower extremities. And then the upper two thirds of the lower extremity. There was negative clonus negative Homans. There was tenderness over the PSIS and PII S regions of moderate degree. No sensory deficit of dermatomal distribution was detected. Lower extremity swelling was noted without evidence of newly formed lesions of the skin of the lower extremities there was negative clonus negative Homans. Abdomen was soft nontender and no costovertebral tenderness was noted.     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

## 2015-05-14 ENCOUNTER — Encounter: Payer: Self-pay | Admitting: Pain Medicine

## 2015-05-14 ENCOUNTER — Ambulatory Visit: Payer: Medicare Other | Attending: Pain Medicine | Admitting: Pain Medicine

## 2015-05-14 VITALS — BP 136/78 | HR 98 | Temp 98.3°F | Resp 18 | Ht 62.0 in | Wt 180.0 lb

## 2015-05-14 DIAGNOSIS — R51 Headache: Secondary | ICD-10-CM | POA: Diagnosis not present

## 2015-05-14 DIAGNOSIS — M5136 Other intervertebral disc degeneration, lumbar region: Secondary | ICD-10-CM | POA: Diagnosis not present

## 2015-05-14 DIAGNOSIS — M533 Sacrococcygeal disorders, not elsewhere classified: Secondary | ICD-10-CM | POA: Diagnosis not present

## 2015-05-14 DIAGNOSIS — Q761 Klippel-Feil syndrome: Secondary | ICD-10-CM

## 2015-05-14 DIAGNOSIS — Z981 Arthrodesis status: Secondary | ICD-10-CM | POA: Diagnosis not present

## 2015-05-14 DIAGNOSIS — M5481 Occipital neuralgia: Secondary | ICD-10-CM | POA: Insufficient documentation

## 2015-05-14 DIAGNOSIS — I890 Lymphedema, not elsewhere classified: Secondary | ICD-10-CM | POA: Insufficient documentation

## 2015-05-14 DIAGNOSIS — M5030 Other cervical disc degeneration, unspecified cervical region: Secondary | ICD-10-CM | POA: Diagnosis not present

## 2015-05-14 DIAGNOSIS — M47812 Spondylosis without myelopathy or radiculopathy, cervical region: Secondary | ICD-10-CM

## 2015-05-14 DIAGNOSIS — M542 Cervicalgia: Secondary | ICD-10-CM | POA: Diagnosis present

## 2015-05-14 DIAGNOSIS — M546 Pain in thoracic spine: Secondary | ICD-10-CM | POA: Diagnosis present

## 2015-05-14 DIAGNOSIS — M47816 Spondylosis without myelopathy or radiculopathy, lumbar region: Secondary | ICD-10-CM | POA: Insufficient documentation

## 2015-05-14 DIAGNOSIS — G5620 Lesion of ulnar nerve, unspecified upper limb: Secondary | ICD-10-CM | POA: Insufficient documentation

## 2015-05-14 DIAGNOSIS — M461 Sacroiliitis, not elsewhere classified: Secondary | ICD-10-CM

## 2015-05-14 MED ORDER — MORPHINE SULFATE ER 15 MG PO TBCR: EXTENDED_RELEASE_TABLET | ORAL | Status: DC

## 2015-05-14 MED ORDER — OXYCODONE-ACETAMINOPHEN 5-325 MG PO TABS: ORAL_TABLET | ORAL | Status: DC

## 2015-05-14 NOTE — Progress Notes (Signed)
Subjective:    Patient ID: Sabrina Stanley, female    DOB: [birth year], 54 y.o.   MRN: [id number]  HPI  Patient 54 year old female returns to pain management for further evaluation and treatment of pain involving the neck upper mid and lower back lower extremity regions as well as headaches. On today's visit patient stated that she has pain occurring the back of the neck radiating from the back of the head precipitating headaches. Patient also admitted to pain involving the upper extremity region especially on the right with slight weakness of the right upper extremity compared to the left upper extremity. Patient has noted a decrease in the swelling of the lower extremities and is wearing compression hose Of the lower extremities. We discussed patient's condition at the present time patient will follow-up Dr. Brigitte Pulse for treatment of headache. We will continue present medications and will consider modification of treatment pending follow-up evaluation and further assessment of patient's condition. The patient was in agreement with suggested treatment plan. We discussed patient undergoing cervical MRI as well as [facility] EMG studies for further evaluation of the upper extremity pain paresthesias patient will proceed with follow-up evaluation with Dr. Manuella Ghazi will be considered for further evaluation and treatment as discussed. The patient was agreement status treatment plan we will continue morphine sulfate extended-release and oxycodone acetaminophen at this time. The patient agreement with suggested plan     Review of Systems     Objective:   Physical Exam  There was tenderness of the splenius capitis and occipitalis musculature region of moderate degree. There was moderate tends of the cervical facet cervical paraspinal musculature region. Palpation of the trapezius levator scapula and rhomboid musculature region reproduces moderate discomfort. There appeared to be unremarkable Spurling's maneuver. There  was decreased grip strength on the right compared to the left. Patient was a tends to palpation of the epicondyles of the elbow both medial and lateral epicondyles. Tinel and Phalen's maneuver associated with moderate discomfort as well. There appeared to be unremarkable Spurling's maneuver.. Patient with well-healed surgical scar the cervical region without increased warmth or erythema in the region of the scar. Palpation of the thoracic facet thoracic paraspinal muscles reproduced moderate discomfort with moderate muscle spasm been noted. There was tenderness to palpation over the lumbar paraspinal musculature region lumbar facet region of moderate degree. Lateral bending and rotation and extension to palpation of the lumbar facets reproduce moderate discomfort. Straight leg raising limited to approximately 20. No definite sensory deficit of dermatomal distribution detected. There appeared to be decreased sensation of the lower extremities in a stocking type distribution. Stasis dermatitis changes were noted of the lower extremity. There was negative clonus negative Homans. DTRs difficult to elicit patient had difficulty relaxing. There was tenderness of the PSIS and PII S region a moderate degree. The abdomen was nontender with no costovertebral angle tenderness noted      Assessment & Plan:    Degenerative disc disease lumbar spine Degenerative changes L2-3 L4, L4-5, and L5-S1 most significantly involved  Lumbar facet syndrome  Sacroiliac joint dysfunction  Degenerative disc disease cervical spine Status post cervical fusion  Carpal tunnel syndrome versus cervical radiculopathy versus ulnar neuropathy  Bilateral occipital neuralgia  Cervicogenic headaches  Migraine headaches  Lymphedema lower extremities    PLAN   Continue present medication Percocet and morphine sulfate extended-release  F/U PCP Dr. Doy Hutching for  evaliation of  BP and general medical  condition  F/U surgical  evaluation. May consider pending follow-up evaluations  F/U neurological evaluation with Dr.Shah as planned. [facility] EMG studies as well as cervical MRI and other studies to be considered for evaluation of pain of the cervical and upper extremity regions  May consider radiofrequency rhizolysis or intraspinal procedures pending response to present treatment and F/U evaluation   Patient to call [facility] should patient have concerns prior to scheduled return appointment.

## 2015-05-14 NOTE — Patient Instructions (Addendum)
PLAN   Continue present medication oxycodone/cetaminophen and morphine sulfate extended release  F/U PCP Dr. Doy Hutching for evaliation of  BP and general medical  condition  F/U surgical evaluation. May consider pending follow-up evaluations  F/U neurological evaluation. . Follow-up Dr.Shah as planned for further evaluation and treatment   F/U vascular evaluation   May consider radiofrequency rhizolysis or intraspinal procedures pending response to present treatment and F/U evaluation   Patient to call [facility] should patient have concerns prior to scheduled return appointment.

## 2015-05-14 NOTE — Progress Notes (Signed)
Safety precautions to be maintained throughout the outpatient stay will include: orient to surroundings, keep bed in low position, maintain call bell within reach at all times, provide assistance with transfer out of bed and ambulation.  

## 2015-05-28 ENCOUNTER — Emergency Department: Payer: Medicare Other

## 2015-05-28 ENCOUNTER — Emergency Department
Admission: EM | Admit: 2015-05-28 | Discharge: 2015-05-28 | Disposition: A | Discharge status: Home / Self Care | Payer: Medicare Other | Attending: Emergency Medicine | Admitting: Emergency Medicine

## 2015-05-28 DIAGNOSIS — Z792 Long term (current) use of antibiotics: Secondary | ICD-10-CM | POA: Insufficient documentation

## 2015-05-28 DIAGNOSIS — Z880 Allergy status to penicillin: Secondary | ICD-10-CM | POA: Insufficient documentation

## 2015-05-28 DIAGNOSIS — Z720 Tobacco use: Secondary | ICD-10-CM | POA: Diagnosis not present

## 2015-05-28 DIAGNOSIS — Y9389 Activity, other specified: Secondary | ICD-10-CM | POA: Diagnosis not present

## 2015-05-28 DIAGNOSIS — S0990XA Unspecified injury of head, initial encounter: Secondary | ICD-10-CM | POA: Diagnosis present

## 2015-05-28 DIAGNOSIS — W01198A Fall on same level from slipping, tripping and stumbling with subsequent striking against other object, initial encounter: Secondary | ICD-10-CM | POA: Diagnosis not present

## 2015-05-28 DIAGNOSIS — Y998 Other external cause status: Secondary | ICD-10-CM | POA: Diagnosis not present

## 2015-05-28 DIAGNOSIS — Z79899 Other long term (current) drug therapy: Secondary | ICD-10-CM | POA: Insufficient documentation

## 2015-05-28 DIAGNOSIS — S0101XA Laceration without foreign body of scalp, initial encounter: Secondary | ICD-10-CM | POA: Insufficient documentation

## 2015-05-28 DIAGNOSIS — Y92010 Kitchen of single-family (private) house as the place of occurrence of the external cause: Secondary | ICD-10-CM | POA: Insufficient documentation

## 2015-05-28 LAB — COMPREHENSIVE METABOLIC PANEL
ALT: 18 U/L (ref 14–54)
AST: 22 U/L (ref 15–41)
Albumin: 4.5 g/dL (ref 3.5–5.0)
Alkaline Phosphatase: 74 U/L (ref 38–126)
Anion gap: 7 (ref 5–15)
BUN: 24 mg/dL — AB (ref 6–20)
CHLORIDE: 107 mmol/L (ref 101–111)
CO2: 25 mmol/L (ref 22–32)
Calcium: 9.5 mg/dL (ref 8.9–10.3)
Creatinine, Ser: 0.82 mg/dL (ref 0.44–1.00)
Glucose, Bld: 97 mg/dL (ref 65–99)
POTASSIUM: 4.2 mmol/L (ref 3.5–5.1)
SODIUM: 139 mmol/L (ref 135–145)
Total Bilirubin: 0.6 mg/dL (ref 0.3–1.2)
Total Protein: 7.3 g/dL (ref 6.5–8.1)

## 2015-05-28 LAB — TROPONIN I

## 2015-05-28 LAB — CBC
HCT: 40.2 % (ref 35.0–47.0)
HEMOGLOBIN: 13.2 g/dL (ref 12.0–16.0)
MCH: 29.9 pg (ref 26.0–34.0)
MCHC: 32.9 g/dL (ref 32.0–36.0)
MCV: 90.8 fL (ref 80.0–100.0)
PLATELETS: 173 10*3/uL (ref 150–440)
RBC: 4.43 MIL/uL (ref 3.80–5.20)
RDW: 13.1 % (ref 11.5–14.5)
WBC: 5.9 10*3/uL (ref 3.6–11.0)

## 2015-05-28 NOTE — ED Provider Notes (Signed)
[facility] Emergency Department Provider Note  ____________________________________________  Time seen: 4:00AM  I have reviewed the triage vital signs and the nursing notes.   HISTORY  Chief Complaint Fall      HPI Sabrina Stanley is a 54 y.o. female presents with history of fall in her kitchen tonight striking her occiput on the ground. Patient states that she recalls falling however does not recall why she fell. Patient denies any dizziness no chest pain no shortness of breath. Patient stated that she took her routine medication including morphine ER Ambien and NyQuil before the event.     Past Medical History  Diagnosis Date  . Abnormal mammogram, unspecified   . UTI (urinary tract infection)   . Depression   . Anxiety   . Bipolar 1 disorder   . Arthritis   . Hyperlipidemia   . GERD (gastroesophageal reflux disease)   . Osteopenia   . COPD (chronic obstructive pulmonary disease)     Patient Active Problem List   Diagnosis Date Noted  . Ulnar neuropathy 05/14/2015  . DDD (degenerative disc disease), lumbar 02/11/2015  . Facet syndrome, lumbar 02/11/2015  . Status post cervical spinal fusion 02/11/2015  . Sacroiliac joint dysfunction 02/11/2015    Past Surgical History  Procedure Laterality Date  . Spine surgery  2006, 2008    cervical fusion  . Abdominal hysterectomy  1996  . Eye surgery  2010  . Dilation and curettage of uterus  1979, 1980  . Nasal sinus surgery  1998, 2000  . Knee surgery Right 1998    meniscus repair  . Breast biopsy Right 08-21-12  . Neck surgery      x 2  . Oophorectomy      Current Outpatient Rx  Name  Route  Sig  Dispense  Refill  . ABILIFY 5 MG tablet      5 mg daily.          . Biotin 5 MG CAPS   Oral   Take 1,000 mg by mouth daily.         . CRESTOR 40 MG tablet   Oral   Take 40 mg by mouth daily.          . divalproex (DEPAKOTE ER) 250 MG 24 hr tablet   Oral   Take 250 mg by mouth  daily. 3 tablets at bedtime         . FLUoxetine (PROZAC) 20 MG capsule   Oral   Take 60 mg by mouth daily.          . Fluticasone-Salmeterol (ADVAIR) 100-50 MCG/DOSE AEPB   Inhalation   Inhale 1 puff into the lungs daily as needed.         . hydrOXYzine (ATARAX/VISTARIL) 50 MG tablet   Oral   Take 50 mg by mouth every 6 (six) hours as needed.         Marland Kitchen morphine (MS CONTIN) 15 MG 12 hr tablet      Limit 2-3 tabs by mouth per day if tolerated   80 tablet   0   . oxyCODONE-acetaminophen (PERCOCET/ROXICET) 5-325 MG per tablet      Limit 2-3 tabs per day if tolerated   90 tablet   0   . potassium chloride SA (K-DUR,KLOR-CON) 20 MEQ tablet   Oral   Take 20 mEq by mouth daily.         . primidone (MYSOLINE) 50 MG tablet   Oral  Take 50 mg by mouth 2 (two) times daily.         Marland Kitchen rOPINIRole (REQUIP) 1 MG tablet   Oral   Take 1 mg by mouth at bedtime.          . SUMAtriptan (IMITREX) 100 MG tablet   Oral   Take 100 mg by mouth every 2 (two) hours as needed for migraine.         Marland Kitchen tiZANidine (ZANAFLEX) 4 MG capsule   Oral   Take 4 mg by mouth at bedtime.          . topiramate (TOPAMAX) 100 MG tablet   Oral   Take 100 mg by mouth 2 (two) times daily.         Marland Kitchen torsemide (DEMADEX) 20 MG tablet   Oral   Take 20 mg by mouth 2 (two) times daily.         Marland Kitchen zolpidem (AMBIEN) 10 MG tablet   Oral   Take 10 mg by mouth daily.            Allergies Asa; Darvocet; Effexor; Neurontin; Nsaids; Mobic; and Penicillins  Family History  Problem Relation Age of Onset  . Breast cancer Maternal Aunt     great aunt  . Lung cancer Paternal Uncle   . Lung cancer Father   . Cancer Father   . Cervical cancer Maternal Grandmother   . Depression Mother   . Hypertension Mother     Social History Social History  Substance Use Topics  . Smoking status: Current Every Day Smoker -- 1.00 packs/day for 35 years    Types: Cigarettes  . Smokeless tobacco:  Never Used  . Alcohol Use: No    Review of Systems  Constitutional: Negative for fever. Eyes: Negative for visual changes. ENT: Negative for sore throat. Cardiovascular: Negative for chest pain. Respiratory: Negative for shortness of breath. Gastrointestinal: Negative for abdominal pain, vomiting and diarrhea. Genitourinary: Negative for dysuria. Musculoskeletal: Negative for back pain. Skin: Positive laceration of the scalp Neurological: Negative for headaches, focal weakness or numbness.   10-point ROS otherwise negative.  ____________________________________________   PHYSICAL EXAM:  VITAL SIGNS: ED Triage Vitals  Enc Vitals Group     BP 05/28/15 0149 115/72 mmHg     Pulse Rate 05/28/15 0149 95     Resp 05/28/15 0149 18     Temp 05/28/15 0149 98 F (36.7 C)     Temp Source 05/28/15 0149 Oral     SpO2 05/28/15 0149 97 %     Weight 05/28/15 0149 180 lb (81.647 kg)     Height 05/28/15 0149 5\' 2"  (1.575 m)     Head Cir --      Peak Flow --      Pain Score 05/28/15 0150 8     Pain Loc --      Pain Edu? --      Excl. in [place]? --      Constitutional: Alert and oriented. Well appearing and in no distress. Eyes: Conjunctivae are normal. PERRL. Normal extraocular movements. ENT   Head: Normocephalic and atraumatic.   Nose: No congestion/rhinnorhea.   Mouth/Throat: Mucous membranes are moist.   Neck: No stridor. Cardiovascular: Normal rate, regular rhythm. Normal and symmetric distal pulses are present in all extremities. No murmurs, rubs, or gallops. Respiratory: Normal respiratory effort without tachypnea nor retractions. Breath sounds are clear and equal bilaterally. No wheezes/rales/rhonchi. Gastrointestinal: Soft and nontender. No distention. There is no CVA tenderness.  Genitourinary: deferred Musculoskeletal: Nontender with normal range of motion in all extremities. No joint effusions.  No lower extremity tenderness nor edema. Neurologic:  Normal  speech and language. No gross focal neurologic deficits are appreciated. Speech is normal.  Skin:  Skin is warm, 2 inch linear laceration noted on the occiput of the patient's scalp. Psychiatric: Mood and affect are normal. Speech and behavior are normal. Patient exhibits appropriate insight and judgment.  ____________________________________________    LABS (pertinent positives/negatives)  Labs Reviewed  COMPREHENSIVE METABOLIC PANEL - Abnormal; Notable for the following:    BUN 24 (*)    All other components within normal limits  CBC  TROPONIN I     ____________________________________________   EKG  ED ECG REPORT I, BROWN, Troy N, the attending physician, personally viewed and interpreted this ECG.   Date: 05/28/2015  EKG Time: 1:58AM  Rate: 86  Rhythm: Normal sinus rhythm  Axis: None  Intervals: Normal  ST&T Change: None   ____________________________________________    RADIOLOGY      CT Head Wo Contrast (Final result) Result time: 05/28/15 04:31:35   Final result by Rad Results In Interface (05/28/15 04:31:35)   Narrative:   CLINICAL DATA: Fall with occipital trauma. Initial encounter.  EXAM: CT HEAD WITHOUT CONTRAST  CT CERVICAL SPINE WITHOUT CONTRAST  TECHNIQUE: Multidetector CT imaging of the head and cervical spine was performed following the standard protocol without intravenous contrast. Multiplanar CT image reconstructions of the cervical spine were also generated.  COMPARISON: 07/17/2012 head CT  FINDINGS: CT HEAD FINDINGS  Skull and Sinuses:Occipital scalp laceration without fracture or opaque foreign body.  Chronic sinusitis, status post maxillary antrostomies and turbinectomies based on previous study. Ethmoid thickening is mild to moderate currently.  Orbits: No acute abnormality.  Brain: Negative. No evidence of acute infarction, hemorrhage, hydrocephalus, or mass lesion/mass effect.  CT CERVICAL SPINE  FINDINGS  No evidence of acute fracture or traumatic malalignment.  No gross cervical canal hematoma or prevertebral edema.  C4-C7 ACDF with complete bony fusion. A ventral plate is present and intact the C4-5 level. Adjacent segment degenerative change at C3-4 with uncovertebral spurs causing moderate bilateral bony foraminal stenosis.  Subtle left thyroid nodule measuring 15 mm. The nodule has enlarged since chest CT 12/05/2011 when it measured 9 mm.  IMPRESSION: 1. No evidence of intracranial or cervical spine injury. 2. Occipital scalp laceration without fracture. 3. 15 mm left thyroid nodule. Given growth since 2013 chest CT outpatient sonography is recommended.   Electronically Signed By: Monte Fantasia M.D. On: 05/28/2015 04:31          CT Cervical Spine Wo Contrast (Final result) Result time: 05/28/15 04:31:35   Final result by Rad Results In Interface (05/28/15 04:31:35)   Narrative:   CLINICAL DATA: Fall with occipital trauma. Initial encounter.  EXAM: CT HEAD WITHOUT CONTRAST  CT CERVICAL SPINE WITHOUT CONTRAST  TECHNIQUE: Multidetector CT imaging of the head and cervical spine was performed following the standard protocol without intravenous contrast. Multiplanar CT image reconstructions of the cervical spine were also generated.  COMPARISON: 07/17/2012 head CT  FINDINGS: CT HEAD FINDINGS  Skull and Sinuses:Occipital scalp laceration without fracture or opaque foreign body.  Chronic sinusitis, status post maxillary antrostomies and turbinectomies based on previous study. Ethmoid thickening is mild to moderate currently.  Orbits: No acute abnormality.  Brain: Negative. No evidence of acute infarction, hemorrhage, hydrocephalus, or mass lesion/mass effect.  CT CERVICAL SPINE FINDINGS  No evidence of acute fracture or traumatic malalignment.  No gross cervical canal hematoma or prevertebral edema.  C4-C7 ACDF with complete bony  fusion. A ventral plate is present and intact the C4-5 level. Adjacent segment degenerative change at C3-4 with uncovertebral spurs causing moderate bilateral bony foraminal stenosis.  Subtle left thyroid nodule measuring 15 mm. The nodule has enlarged since chest CT 12/05/2011 when it measured 9 mm.  IMPRESSION: 1. No evidence of intracranial or cervical spine injury. 2. Occipital scalp laceration without fracture. 3. 15 mm left thyroid nodule. Given growth since 2013 chest CT outpatient sonography is recommended.   Electronically Signed By: Monte Fantasia M.D. On: 05/28/2015 04:31           ____________________________________________   PROCEDURES  Procedure(s) performed: LACERATION REPAIR Performed by: Gregor Hams Authorized by: Gregor Hams Consent: Verbal consent obtained. Risks and benefits: risks, benefits and alternatives were discussed Consent given by: patient Patient identity confirmed: provided demographic data Prepped and Draped in normal sterile fashion Wound explored  Laceration Location: Posterior scalp  Laceration Length: 2 inches  No Foreign Bodies seen or palpated  Anesthesia: local infiltration  Local anesthetic: lidocaine 1%   Anesthetic total: 5 ml  Irrigation method: syringe Amount of cleaning: standard  Skin closure: Staples Number of staples :6    Patient tolerance: Patient tolerated the procedure well with no immediate complications.     ____________________________________________   INITIAL IMPRESSION / ASSESSMENT AND PLAN / ED COURSE  Pertinent labs & imaging results that were available during my care of the patient were reviewed by me and considered in my medical decision making (see chart for details).  No clear etiology as to why the patient had this fall however likely possibility is secondary to the fact that the patient took multiple sedating medications prior to the  event.  ____________________________________________   FINAL CLINICAL IMPRESSION(S) / ED DIAGNOSES  Final diagnoses:  Scalp laceration, initial encounter      Gregor Hams, MD 05/28/15 [phone]

## 2015-05-28 NOTE — ED Notes (Signed)
Patient to ED with laceration to top of head. States she fell in her kitchen. Is unsure why she fell, does not recall if she got dizzy or tripped, states "I just woke up on the floor."

## 2015-05-28 NOTE — ED Notes (Signed)
MD at bedside. 

## 2015-05-28 NOTE — ED Notes (Signed)
MD at bedside for lac repair and re eval.

## 2015-05-28 NOTE — ED Notes (Signed)
Pt transported to and from CT via stretcher without incident.

## 2015-05-28 NOTE — Discharge Instructions (Signed)

## 2015-06-04 ENCOUNTER — Emergency Department
Admission: EM | Admit: 2015-06-04 | Discharge: 2015-06-04 | Disposition: A | Discharge status: Home / Self Care | Payer: Medicare Other | Attending: Emergency Medicine | Admitting: Emergency Medicine

## 2015-06-04 ENCOUNTER — Encounter: Payer: Self-pay | Admitting: Emergency Medicine

## 2015-06-04 DIAGNOSIS — Z79899 Other long term (current) drug therapy: Secondary | ICD-10-CM | POA: Insufficient documentation

## 2015-06-04 DIAGNOSIS — Z792 Long term (current) use of antibiotics: Secondary | ICD-10-CM | POA: Diagnosis not present

## 2015-06-04 DIAGNOSIS — Z720 Tobacco use: Secondary | ICD-10-CM | POA: Insufficient documentation

## 2015-06-04 DIAGNOSIS — Z4802 Encounter for removal of sutures: Secondary | ICD-10-CM | POA: Diagnosis not present

## 2015-06-04 DIAGNOSIS — Z880 Allergy status to penicillin: Secondary | ICD-10-CM | POA: Insufficient documentation

## 2015-06-04 NOTE — Discharge Instructions (Signed)

## 2015-06-04 NOTE — ED Provider Notes (Signed)
[facility] Emergency Department Provider Note  ____________________________________________  Time seen: Approximately 10:46 AM  I have reviewed the triage vital signs and the nursing notes.   HISTORY  Chief Complaint Suture / Staple Removal    HPI Sabrina Stanley is a 54 y.o. female since for staple removal. Denies any complaints at this time.   Past Medical History  Diagnosis Date  . Abnormal mammogram, unspecified   . UTI (urinary tract infection)   . Depression   . Anxiety   . Bipolar 1 disorder ([place])   . Arthritis   . Hyperlipidemia   . GERD (gastroesophageal reflux disease)   . Osteopenia   . COPD (chronic obstructive pulmonary disease) [facility])     Patient Active Problem List   Diagnosis Date Noted  . Ulnar neuropathy 05/14/2015  . DDD (degenerative disc disease), lumbar 02/11/2015  . Facet syndrome, lumbar 02/11/2015  . Status post cervical spinal fusion 02/11/2015  . Sacroiliac joint dysfunction 02/11/2015    Past Surgical History  Procedure Laterality Date  . Spine surgery  2006, 2008    cervical fusion  . Abdominal hysterectomy  1996  . Eye surgery  2010  . Dilation and curettage of uterus  1979, 1980  . Nasal sinus surgery  1998, 2000  . Knee surgery Right 1998    meniscus repair  . Breast biopsy Right 08-21-12  . Neck surgery      x 2  . Oophorectomy      Current Outpatient Rx  Name  Route  Sig  Dispense  Refill  . ABILIFY 5 MG tablet      5 mg daily.          . Biotin 5 MG CAPS   Oral   Take 1,000 mg by mouth daily.         . CRESTOR 40 MG tablet   Oral   Take 40 mg by mouth daily.          . divalproex (DEPAKOTE ER) 250 MG 24 hr tablet   Oral   Take 250 mg by mouth daily. 3 tablets at bedtime         . FLUoxetine (PROZAC) 20 MG capsule   Oral   Take 60 mg by mouth daily.          . Fluticasone-Salmeterol (ADVAIR) 100-50 MCG/DOSE AEPB   Inhalation   Inhale 1 puff into the lungs daily as  needed.         . hydrOXYzine (ATARAX/VISTARIL) 50 MG tablet   Oral   Take 50 mg by mouth every 6 (six) hours as needed.         Marland Kitchen morphine (MS CONTIN) 15 MG 12 hr tablet      Limit 2-3 tabs by mouth per day if tolerated   80 tablet   0   . oxyCODONE-acetaminophen (PERCOCET/ROXICET) 5-325 MG per tablet      Limit 2-3 tabs per day if tolerated   90 tablet   0   . potassium chloride SA (K-DUR,KLOR-CON) 20 MEQ tablet   Oral   Take 20 mEq by mouth daily.         . primidone (MYSOLINE) 50 MG tablet   Oral   Take 50 mg by mouth 2 (two) times daily.         Marland Kitchen rOPINIRole (REQUIP) 1 MG tablet   Oral   Take 1 mg by mouth at bedtime.          Marland Kitchen  SUMAtriptan (IMITREX) 100 MG tablet   Oral   Take 100 mg by mouth every 2 (two) hours as needed for migraine.         Marland Kitchen tiZANidine (ZANAFLEX) 4 MG capsule   Oral   Take 4 mg by mouth at bedtime.          . topiramate (TOPAMAX) 100 MG tablet   Oral   Take 100 mg by mouth 2 (two) times daily.         Marland Kitchen torsemide (DEMADEX) 20 MG tablet   Oral   Take 20 mg by mouth 2 (two) times daily.         Marland Kitchen zolpidem (AMBIEN) 10 MG tablet   Oral   Take 10 mg by mouth daily.            Allergies Asa; Darvocet; Effexor; Neurontin; Nsaids; Mobic; and Penicillins  Family History  Problem Relation Age of Onset  . Breast cancer Maternal Aunt     great aunt  . Lung cancer Paternal Uncle   . Lung cancer Father   . Cancer Father   . Cervical cancer Maternal Grandmother   . Depression Mother   . Hypertension Mother     Social History Social History  Substance Use Topics  . Smoking status: Current Every Day Smoker -- 1.00 packs/day for 35 years    Types: Cigarettes  . Smokeless tobacco: Never Used  . Alcohol Use: No    Review of Systems Constitutional: No fever/chills Eyes: No visual changes. ENT: No sore throat. Cardiovascular: Denies chest pain. Respiratory: Denies shortness of breath. Gastrointestinal: No  abdominal pain.  No nausea, no vomiting.  No diarrhea.  No constipation. Genitourinary: Negative for dysuria. Musculoskeletal: Negative for back pain. Skin: Negative for rash. 6 staples in head intact. Neurological: Negative for headaches, focal weakness or numbness.  10-point ROS otherwise negative.  ____________________________________________   PHYSICAL EXAM:  VITAL SIGNS: ED Triage Vitals  Enc Vitals Group     BP 06/04/15 1032 111/82 mmHg     Pulse Rate 06/04/15 1032 86     Resp 06/04/15 1032 16     Temp 06/04/15 1032 98.4 F (36.9 C)     Temp Source 06/04/15 1032 Oral     SpO2 06/04/15 1032 98 %     Weight --      Height --      Head Cir --      Peak Flow --      Pain Score --      Pain Loc --      Pain Edu? --      Excl. in [place]? --     Constitutional: Alert and oriented. Well appearing and in no acute distress. Neurologic:  Normal speech and language. No gross focal neurologic deficits are appreciated. No gait instability. Skin:  Skin is warm, dry and intact. No rash noted. Staples intact with no signs of erythema or or drainage. Psychiatric: Mood and affect are normal. Speech and behavior are normal.  ____________________________________________   LABS (all labs ordered are listed, but only abnormal results are displayed)  Labs Reviewed - No data to display ____________________________________________   PROCEDURES  Procedure(s) performed: None  Critical Care performed: No  ____________________________________________   INITIAL IMPRESSION / ASSESSMENT AND PLAN / ED COURSE  Pertinent labs & imaging results that were available during my care of the patient were reviewed by me and considered in my medical decision making (see chart for details).  Staple removal.  Patient tolerated procedure well. ____________________________________________   FINAL CLINICAL IMPRESSION(S) / ED DIAGNOSES  Final diagnoses:  Removal of staple      Arlyss Repress,  PA-C 06/04/15 Walton Yao, MD 06/04/15 [phone]

## 2015-06-04 NOTE — ED Notes (Signed)
Patient comes in for staple removal.

## 2015-06-11 ENCOUNTER — Ambulatory Visit: Payer: Medicare Other | Attending: Pain Medicine | Admitting: Pain Medicine

## 2015-06-11 ENCOUNTER — Encounter: Payer: Self-pay | Admitting: Pain Medicine

## 2015-06-11 VITALS — BP 115/56 | HR 95 | Temp 98.3°F | Resp 16 | Ht 62.5 in | Wt 177.0 lb

## 2015-06-11 DIAGNOSIS — M5481 Occipital neuralgia: Secondary | ICD-10-CM | POA: Insufficient documentation

## 2015-06-11 DIAGNOSIS — M5030 Other cervical disc degeneration, unspecified cervical region: Secondary | ICD-10-CM | POA: Diagnosis not present

## 2015-06-11 DIAGNOSIS — Q761 Klippel-Feil syndrome: Secondary | ICD-10-CM

## 2015-06-11 DIAGNOSIS — M47816 Spondylosis without myelopathy or radiculopathy, lumbar region: Secondary | ICD-10-CM

## 2015-06-11 DIAGNOSIS — Z981 Arthrodesis status: Secondary | ICD-10-CM

## 2015-06-11 DIAGNOSIS — I890 Lymphedema, not elsewhere classified: Secondary | ICD-10-CM | POA: Diagnosis not present

## 2015-06-11 DIAGNOSIS — M5136 Other intervertebral disc degeneration, lumbar region: Secondary | ICD-10-CM

## 2015-06-11 DIAGNOSIS — M533 Sacrococcygeal disorders, not elsewhere classified: Secondary | ICD-10-CM | POA: Diagnosis not present

## 2015-06-11 DIAGNOSIS — M549 Dorsalgia, unspecified: Secondary | ICD-10-CM | POA: Diagnosis present

## 2015-06-11 DIAGNOSIS — G5620 Lesion of ulnar nerve, unspecified upper limb: Secondary | ICD-10-CM

## 2015-06-11 DIAGNOSIS — I739 Peripheral vascular disease, unspecified: Secondary | ICD-10-CM | POA: Diagnosis not present

## 2015-06-11 DIAGNOSIS — M461 Sacroiliitis, not elsewhere classified: Secondary | ICD-10-CM

## 2015-06-11 DIAGNOSIS — M542 Cervicalgia: Secondary | ICD-10-CM | POA: Diagnosis present

## 2015-06-11 DIAGNOSIS — M51369 Other intervertebral disc degeneration, lumbar region without mention of lumbar back pain or lower extremity pain: Secondary | ICD-10-CM

## 2015-06-11 DIAGNOSIS — M47812 Spondylosis without myelopathy or radiculopathy, cervical region: Secondary | ICD-10-CM

## 2015-06-11 MED ORDER — OXYCODONE-ACETAMINOPHEN 5-325 MG PO TABS: ORAL_TABLET | ORAL | Status: DC

## 2015-06-11 MED ORDER — MORPHINE SULFATE ER 15 MG PO TBCR: EXTENDED_RELEASE_TABLET | ORAL | Status: DC

## 2015-06-11 NOTE — Progress Notes (Signed)
Safety precautions to be maintained throughout the outpatient stay will include: orient to surroundings, keep bed in low position, maintain call bell within reach at all times, provide assistance with transfer out of bed and ambulation.  

## 2015-06-11 NOTE — Progress Notes (Signed)
Subjective:    Patient ID: Sabrina Stanley, female    DOB: 20-Sep-1960, 54 y.o.   MRN: [id number]  HPI Patient is 53 year old female who returns to [place] for further evaluation and treatment of pain involving the neck and entire back upper and lower extremity regions. Patient states that she recently fainted while she was in her kitchen sustaining trauma to head requiring sutures. Patient was evaluated at emergency department with unremarkable CT of the head according to patient. She is undergoing further evaluations at this time. We discussed patient's overall condition and will continue with present treatment regimen. The patient is with pain of the lower back lower extremity region. Patient also was with lower extremity swelling. We will address patient condition and have discussed the use of support stockings and also discuss pneumatic compression device which can be worn at times in attempt to improve patient's lower extremity condition from a vascular standpoint. At the present time we will avoid modification of treatment regimen and remain available to modified treatment as needed pending further assessment of patient's condition is patient undergoes further evaluation of her condition as it relates to her previous fainting and trauma to head area the patient was with understanding and agreed suggested treatment plan      Review of Systems     Objective:   Physical Exam There was tends to palpation of paraspinal motions cervical region cervical facet region. There was tenderness to palpation of the head without new masses of the head or neck noted. Patient was with well-healed surgical scar of the neck without increased warmth or erythema in the region of the scar. Patient was with limited range of motion of the cervical spine with unremarkable Spurling's maneuver. Patient was with decreased grip strength and Tinel and Phalen's maneuver were without increased pain of any  significant degree. There was mild tenderness of the acromial clavicular and glenohumeral joint region. Palpation of the thoracic facet thoracic paraspinal muscles region was a tends to palpation evidence of muscle spasms. There was tenderness to palpation of the medial and lateral epicondyles of the elbow. Palpation over the lumbar paraspinal muscles region lumbar facet region was with moderate discomfort. Lateral bending and rotation and extension and palpation of the lumbar facets reproduce moderate discomfort as well as palpation over the PSIS and PII S regions. There was moderate tenderness over the gluteal and piriformis musculature regions. Palpation of the greater trochanteric region iliotibial band region reproduced mild to moderate discomfort. Straight leg raising was tolerates approximately 20 without increased pain with dorsiflexion noted. There was 1+ lower extremity edema with no increased warmth or erythema of the lower extremities noted. EHL strength was decreased no definite sensory deficit of dermatomal distribution was detected There was negative clonus negative Homans.. The abdomen was nontender with no costovertebral angle tenderness noted       Assessment & Plan:     Degenerative disc disease lumbar spine Degenerative changes L2-3 L4, L4-5, and L5-S1 most significantly involved  Lumbar facet syndrome  Sacroiliac joint dysfunction  Degenerative disc disease cervical spine Status post cervical fusion  Carpal tunnel syndrome versus cervical radiculopathy versus ulnar neuropathy  Bilateral occipital neuralgia  Peripheral vascular disease  Lymphedema of lower extremities     PLAN   Continue present medication oxycodone and MS Contin  F/U PCP Dr. Doy Hutching for evaliation of  BP and general medical  condition  F/U surgical evaluation. May consider pending follow-up evaluations  F/U vascular evaluation as discussed  F/U neurological evaluation as discussed  F/U  psych evaluation as discussed  May consider radiofrequency rhizolysis or intraspinal procedures pending response to present treatment and F/U evaluation   Patient to call [facility] should patient have concerns prior to scheduled return appointment.

## 2015-06-11 NOTE — Progress Notes (Signed)
Discharged ambulatory at 8:30 am

## 2015-06-11 NOTE — Patient Instructions (Addendum)
PLAN   Continue present medication Percocet and MS Contin  F/U PCP Dr. Doy Hutching for evaliation of  BP and general medical  condition  F/U surgical evaluation. May consider pending follow-up evaluations  F/U neurological evaluation with Dr. Manuella Ghazi as scheduled  F/U vascular evaluation  May consider radiofrequency rhizolysis or intraspinal procedures pending response to present treatment and F/U evaluation   Patient to call [facility] should patient have concerns prior to scheduled return appointment.  A prescription for PERCOCET, MS CONTIN was given to you today.

## 2015-07-08 ENCOUNTER — Encounter: Payer: Self-pay | Admitting: Pain Medicine

## 2015-07-08 ENCOUNTER — Ambulatory Visit: Payer: Medicare Other | Attending: Pain Medicine | Admitting: Pain Medicine

## 2015-07-08 VITALS — BP 107/70 | HR 85 | Temp 98.5°F | Resp 16 | Ht 62.0 in | Wt 178.0 lb

## 2015-07-08 DIAGNOSIS — M47816 Spondylosis without myelopathy or radiculopathy, lumbar region: Secondary | ICD-10-CM | POA: Insufficient documentation

## 2015-07-08 DIAGNOSIS — M545 Low back pain: Secondary | ICD-10-CM | POA: Diagnosis present

## 2015-07-08 DIAGNOSIS — G5620 Lesion of ulnar nerve, unspecified upper limb: Secondary | ICD-10-CM

## 2015-07-08 DIAGNOSIS — M5481 Occipital neuralgia: Secondary | ICD-10-CM | POA: Diagnosis not present

## 2015-07-08 DIAGNOSIS — M533 Sacrococcygeal disorders, not elsewhere classified: Secondary | ICD-10-CM

## 2015-07-08 DIAGNOSIS — M461 Sacroiliitis, not elsewhere classified: Secondary | ICD-10-CM

## 2015-07-08 DIAGNOSIS — Z981 Arthrodesis status: Secondary | ICD-10-CM | POA: Diagnosis not present

## 2015-07-08 DIAGNOSIS — M5136 Other intervertebral disc degeneration, lumbar region: Secondary | ICD-10-CM | POA: Insufficient documentation

## 2015-07-08 DIAGNOSIS — M47812 Spondylosis without myelopathy or radiculopathy, cervical region: Secondary | ICD-10-CM

## 2015-07-08 DIAGNOSIS — M5030 Other cervical disc degeneration, unspecified cervical region: Secondary | ICD-10-CM | POA: Diagnosis not present

## 2015-07-08 DIAGNOSIS — Q761 Klippel-Feil syndrome: Secondary | ICD-10-CM

## 2015-07-08 MED ORDER — MORPHINE SULFATE ER 15 MG PO TBCR: EXTENDED_RELEASE_TABLET | ORAL | Status: DC

## 2015-07-08 MED ORDER — OXYCODONE-ACETAMINOPHEN 5-325 MG PO TABS: ORAL_TABLET | ORAL | Status: DC

## 2015-07-08 NOTE — Progress Notes (Signed)
   Subjective:    Patient ID: Sabrina Stanley, female    DOB: [birth year], 54 y.o.   MRN: [id number]  HPI  The patient is a 54 year old female who [facility] for further evaluation and treatment of pain involving the lower back or extremity region pain which patient states occur in the region of the buttocks. After evaluation the patient appeared that patient's pain involving the sacroiliac joint region and lumbar facet region predominantly without significant pain identified in the region of the hip. We discussed patient's condition and will request radiofrequency rhizolysis of the lumbar facets medial branch nerves in attempt to decrease severity of patient's symptoms. The patient is lower extremity swelling and we preferred to afford steroid injections. The request insurance approval for radiofrequency rhizolysis at this time in attempt to decrease severity of symptoms, minimize progression of symptoms, and avoid the side effects of steroid which would be deleterious to the patient's lower extremity condition. We will continue oxycodone acetaminophen and MS Contin at this time and will await insurance approval for radiolucent rhizolysis lumbar facet, medial branch nerves, the patient was in agreement to suggested treatment plan     Review of Systems     Objective:   Physical Exam  There was tenderness to palpation of the splenius cavity in the patellas regions of mild degree with well-healed surgical scar of the cervical region without increased warmth and erythema in the region of the scar. Patient appeared to be unremarkable Spurling's maneuver and was slightly decreased grip strength. Tinel and Phalen's maneuver will increase of pain of significant degree. Palpation of the thoracic facet thoracic paraspinal musculature region reproduced pain of mild degree. There was mild tenderness over the region of the greater trochanteric region and iliotibial band region. Palpation  over the lumbar facet lumbar paraspinal musculature he can reproduce severe pain. There is severe tenderness to palpation over the PSIS and DPS regions. Straight leg raising was tolerated to approximately 20 without increased pain with dorsiflexion noted. There was negative clonus negative Homans. Abdomen nontender and no costovertebral tenderness noted.    Assessment & Plan:    Degenerative disc disease lumbar spine Degenerative changes L2-3 L4, L4-5, and L5-S1 most significantly involved  Lumbar facet syndrome  Sacroiliac joint dysfunction  Degenerative disc disease cervical spine Status post cervical fusion  Carpal tunnel syndrome versus cervical radiculopathy versus ulnar neuropathy  Bilateral occipital neuralgia     PLAN    Continue present medication oxycodone and MS Contin  Radiofrequency rhizolysis lumbar facet, medial branch nerves, to be performed at time return appointment. The patient is a prior lumbar facet, medial branch nerve blocks with greater than 50% relief of pain following the procedures  F/U PCP Dr. Doy Hutching for evaliation of  BP and general medical  condition  F/U surgical evaluation. May consider pending follow-up evaluations  F/U vascular evaluation as discussed   F/U neurological evaluation as discussed  F/U psych evaluation as discussed  At the present time we will request approval for radiofrequency rhizolysis lumbar facet, medial branch nerve, since our radiofrequency technique will afford steroid the patient will benefit significantly in terms of relief of back pain without exposing patient the risk of worsening of her lower extremity edema.  Patient to call Pain Management Center should patient have concerns prior to scheduled return appointment.

## 2015-07-08 NOTE — Patient Instructions (Addendum)
PLAN   Continue present medication Percocet and MS Contin  Radiofrequency rhizolysis lumbar facet, medial branch nerves to be performed at return appointment  F/U PCP Dr. Doy Hutching for evaliation of  BP and general medical  condition  F/U surgical evaluation. May consider pending follow-up evaluations. May follow up with Dr.Menz as discussed   F/U neurological evaluation Patient will follow up with Dr. Manuella Ghazi as planned  F/U vascular evaluation  Ask receptionists to call you when you are approved by radiofrequency rhizolysis lumbar facet, medial branch nervesPain Management Discharge Instructions  General Discharge Instructions :  If you need to reach your doctor call: Monday-Friday 8:00 am - 4:00 pm at [phone] or toll free [phone].  After clinic hours [phone] to have operator reach doctor.  Bring all of your medication bottles to all your appointments in the pain clinic.  To cancel or reschedule your appointment with Pain Management please remember to call 24 hours in advance to avoid a fee.  Refer to the educational materials which you have been given on: General Risks, I had my Procedure. Discharge Instructions, Post Sedation.  Post Procedure Instructions:  The drugs you were given will stay in your system until tomorrow, so for the next 24 hours you should not drive, make any legal decisions or drink any alcoholic beverages.  You may eat anything you prefer, but it is better to start with liquids then soups and crackers, and gradually work up to solid foods.  Please notify your doctor immediately if you have any unusual bleeding, trouble breathing or pain that is not related to your normal pain.  Depending on the type of procedure that was done, some parts of your body may feel week and/or numb.  This usually clears up by tonight or the next day.  Walk with the use of an assistive device or accompanied by an adult for the 24 hours.  You may use ice on the  affected area for the first 24 hours.  Put ice in a Ziploc bag and cover with a towel and place against area 15 minutes on 15 minutes off.  You may switch to heat after 24 hours.Radiofrequency Lesioning, Care After Refer to this sheet in the next few weeks. These instructions provide you with information about caring for yourself after your procedure. Your health care provider may also give you more specific instructions. Your treatment has been planned according to current medical practices, but problems sometimes occur. Call your health care provider if you have any problems or questions after your procedure. WHAT TO EXPECT AFTER THE PROCEDURE After your procedure, it is common to have:  Pain from the burned nerve.  Temporary numbness. HOME CARE INSTRUCTIONS  Take over-the-counter and prescription medicines only as told by your health care provider.  Return to your normal activities as told by your health care provider. Ask your health care provider what activities are safe for you.  Pay close attention to how you feel after the procedure. If you start to have pain, write down when it hurts and how it feels. This will help you and your health care provider to know if you need an additional treatment.  Check your needle insertion site every day for signs of infection. Watch for:  Redness, swelling, or pain.  Fluid, blood, or pus.  Keep all follow-up visits as told by your health care provider. This is important. SEEK MEDICAL CARE IF:  Your pain does not get better.  You have redness, swelling, or pain at  the needle insertion site.  You have fluid, blood, or pus coming from the needle insertion site.  You have a fever. SEEK IMMEDIATE MEDICAL CARE IF:  You develop sudden, severe pain.  You develop numbness or tingling near the procedure site that does not go away.   This information is not intended to replace advice given to you by your health care provider. Make sure you discuss  any questions you have with your health care provider.   Document Released: 04/13/2011 Document Revised: 05/06/2015 Document Reviewed: 09/22/2014 Elsevier Interactive Patient Education 2016 [place]. Radiofrequency Lesioning Radiofrequency lesioning is a procedure that is performed to relieve pain. The procedure is often used for back, neck, or arm pain. Radiofrequency lesioning involves the use of a machine that creates radio waves to make heat. During the procedure, the heat is applied to the nerve that carries the pain signal. The heat damages the nerve and interferes with the pain signal. Pain relief usually lasts for 6 months to 1 year. LET [facility] CARE PROVIDER KNOW ABOUT:  Any allergies you have.  All medicines you are taking, including vitamins, herbs, eye drops, creams, and over-the-counter medicines.  Previous problems you or members of your family have had with the use of anesthetics.  Any blood disorders you have.  Previous surgeries you have had.  Any medical conditions you have.  Whether you are pregnant or may be pregnant. RISKS AND COMPLICATIONS Generally, this is a safe procedure. However, problems may occur, including:  Pain or soreness at the injection site.  Infection at the injection site.  Damage to nerves or blood vessels. BEFORE THE PROCEDURE  Ask your health care provider about:  Changing or stopping your regular medicines. This is especially important if you are taking diabetes medicines or blood thinners.  Taking medicines such as aspirin and ibuprofen. These medicines can thin your blood. Do not take these medicines before your procedure if your health care provider instructs you not to.  Follow instructions from your health care provider about eating or drinking restrictions.  Plan to have someone take you home after the procedure.  If you go home right after the procedure, plan to have someone with you for 24 hours. PROCEDURE  You  will be given one or more of the following:  A medicine to help you relax (sedative).  A medicine to numb the area (local anesthetic).  You will be awake during the procedure. You will need to be able to talk with the health care provider during the procedure.  With the help of a type of X-ray (fluoroscopy), the health care provider will insert a radiofrequency needle into the area to be treated.  Next, a wire that carries the radio waves (electrode) will be put through the radiofrequency needle. An electrical pulse will be sent through the electrode to verify the correct nerve. You will feel a tingling sensation, and you may have muscle twitching.  Then, the tissue that is around the needle tip will be heated by an electric current that is passed using the radiofrequency machine. This will numb the nerves.  A bandage (dressing) will be put on the insertion area after the procedure is done. The procedure may vary among health care providers and hospitals. AFTER THE PROCEDURE  Your blood pressure, heart rate, breathing rate, and blood oxygen level will be monitored often until the medicines you were given have worn off.  Return to your normal activities as directed by your health care provider.  This information is not intended to replace advice given to you by your health care provider. Make sure you discuss any questions you have with your health care provider.   Document Released: 04/13/2011 Document Revised: 05/06/2015 Document Reviewed: 09/22/2014 Elsevier Interactive Patient Education 2016 Sabrina Stanley  What are the risk, side effects and possible complications? Generally speaking, most procedures are safe.  However, with any procedure there are risks, side effects, and the possibility of complications.  The risks and complications are dependent upon the sites that are lesioned, or the type of nerve block to be performed.  The closer the procedure  is to the spine, the more serious the risks are.  Great care is taken when placing the radio frequency needles, block needles or lesioning probes, but sometimes complications can occur.  Infection: Any time there is an injection through the skin, there is a risk of infection.  This is why sterile conditions are used for these blocks.  There are four possible types of infection.  Localized skin infection.  Central Nervous System Infection-This can be in the form of Meningitis, which can be deadly.  Epidural Infections-This can be in the form of an epidural abscess, which can cause pressure inside of the spine, causing compression of the spinal cord with subsequent paralysis. This would require an emergency surgery to decompress, and there are no guarantees that the patient would recover from the paralysis.  Discitis-This is an infection of the intervertebral discs.  It occurs in about 1% of discography procedures.  It is difficult to treat and it may lead to surgery.        2. Pain: the needles have to go through skin and soft tissues, will cause soreness.       3. Damage to internal structures:  The nerves to be lesioned may be near blood vessels or    other nerves which can be potentially damaged.       4. Bleeding: Bleeding is more common if the patient is taking blood thinners such as  aspirin, Coumadin, Ticiid, Plavix, etc., or if he/she have some genetic predisposition  such as hemophilia. Bleeding into the spinal canal can cause compression of the spinal  cord with subsequent paralysis.  This would require an emergency surgery to  decompress and there are no guarantees that the patient would recover from the  paralysis.       5. Pneumothorax:  Puncturing of a lung is a possibility, every time a needle is introduced in  the area of the chest or upper back.  Pneumothorax refers to free air around the  collapsed lung(s), inside of the thoracic cavity (chest cavity).  Another two possible   complications related to a similar event would include: Hemothorax and Chylothorax.   These are variations of the Pneumothorax, where instead of air around the collapsed  lung(s), you may have blood or chyle, respectively.       6. Spinal headaches: They may occur with any procedures in the area of the spine.       7. Persistent CSF (Cerebro-Spinal Fluid) leakage: This is a rare problem, but may occur  with prolonged intrathecal or epidural catheters either due to the formation of a fistulous  track or a dural tear.       8. Nerve damage: By working so close to the spinal cord, there is always a possibility of  nerve damage, which could be as serious as a permanent spinal cord injury  with  paralysis.       9. Death:  Although rare, severe deadly allergic reactions known as "Anaphylactic  reaction" can occur to any of the medications used.      10. Worsening of the symptoms:  We can always make thing worse.  What are the chances of something like this happening? Chances of any of this occuring are extremely low.  By statistics, you have more of a chance of getting killed in a motor vehicle accident: while driving to the hospital than any of the above occurring .  Nevertheless, you should be aware that they are possibilities.  In general, it is similar to taking a shower.  Everybody knows that you can slip, hit your head and get killed.  Does that mean that you should not shower again?  Nevertheless always keep in mind that statistics do not mean anything if you happen to be on the wrong side of them.  Even if a procedure has a 1 (one) in a 1,000,000 (million) chance of going wrong, it you happen to be that one..Also, keep in mind that by statistics, you have more of a chance of having something go wrong when taking medications.  Who should not have this procedure? If you are on a blood thinning medication (e.g. Coumadin, Plavix, see list of "Blood Thinners"), or if you have an active infection going on,  you should not have the procedure.  If you are taking any blood thinners, please inform your physician.  How should I prepare for this procedure?  Do not eat or drink anything at least six hours prior to the procedure.  Bring a driver with you .  It cannot be a taxi.  Come accompanied by an adult that can drive you back, and that is strong enough to help you if your legs get weak or numb from the local anesthetic.  Take all of your medicines the morning of the procedure with just enough water to swallow them.  If you have diabetes, make sure that you are scheduled to have your procedure done first thing in the morning, whenever possible.  If you have diabetes, take only half of your insulin dose and notify our nurse that you have done so as soon as you arrive at the clinic.  If you are diabetic, but only take blood sugar pills (oral hypoglycemic), then do not take them on the morning of your procedure.  You may take them after you have had the procedure.  Do not take aspirin or any aspirin-containing medications, at least eleven (11) days prior to the procedure.  They may prolong bleeding.  Wear loose fitting clothing that may be easy to take off and that you would not mind if it got stained with Betadine or blood.  Do not wear any jewelry or perfume  Remove any nail coloring.  It will interfere with some of our monitoring equipment.  NOTE: Remember that this is not meant to be interpreted as a complete list of all possible complications.  Unforeseen problems may occur.  BLOOD THINNERS The following drugs contain aspirin or other products, which can cause increased bleeding during surgery and should not be taken for 2 weeks prior to and 1 week after surgery.  If you should need take something for relief of minor pain, you may take acetaminophen which is found in Tylenol,m Datril, Anacin-3 and Panadol. It is not blood thinner. The products listed below are.  Do not take any of the  products listed below in addition to any listed on your instruction sheet.  A.P.C or A.P.C with Codeine Codeine Phosphate Capsules #3 Ibuprofen Ridaura  ABC compound Congesprin Imuran rimadil  Advil Cope Indocin Robaxisal  Alka-Seltzer Effervescent Pain Reliever and Antacid Coricidin or Coricidin-D  Indomethacin Rufen  Alka-Seltzer plus Cold Medicine Cosprin Ketoprofen S-A-C Tablets  Anacin Analgesic Tablets or Capsules Coumadin Korlgesic Salflex  Anacin Extra Strength Analgesic tablets or capsules CP-2 Tablets Lanoril Salicylate  Anaprox Cuprimine Capsules Levenox Salocol  Anexsia-D Dalteparin Magan Salsalate  Anodynos Darvon compound Magnesium Salicylate Sine-off  Ansaid Dasin Capsules Magsal Sodium Salicylate  Anturane Depen Capsules Marnal Soma  APF Arthritis pain formula Dewitt's Pills Measurin Stanback  Argesic Dia-Gesic Meclofenamic Sulfinpyrazone  Arthritis Bayer Timed Release Aspirin Diclofenac Meclomen Sulindac  Arthritis pain formula Anacin Dicumarol Medipren Supac  Analgesic (Safety coated) Arthralgen Diffunasal Mefanamic Suprofen  Arthritis Strength Bufferin Dihydrocodeine Mepro Compound Suprol  Arthropan liquid Dopirydamole Methcarbomol with Aspirin Synalgos  ASA tablets/Enseals Disalcid Micrainin Tagament  Ascriptin Doan's Midol Talwin  Ascriptin A/D Dolene Mobidin Tanderil  Ascriptin Extra Strength Dolobid Moblgesic Ticlid  Ascriptin with Codeine Doloprin or Doloprin with Codeine Momentum Tolectin  Asperbuf Duoprin Mono-gesic Trendar  Aspergum Duradyne Motrin or Motrin IB Triminicin  Aspirin plain, buffered or enteric coated Durasal Myochrisine Trigesic  Aspirin Suppositories Easprin Nalfon Trillsate  Aspirin with Codeine Ecotrin Regular or Extra Strength Naprosyn Uracel  Atromid-S Efficin Naproxen Ursinus  Auranofin Capsules Elmiron Neocylate Vanquish  Axotal Emagrin Norgesic Verin  Azathioprine Empirin or Empirin with Codeine Normiflo Vitamin E  Azolid Emprazil  Nuprin Voltaren  Bayer Aspirin plain, buffered or children's or timed BC Tablets or powders Encaprin Orgaran Warfarin Sodium  Buff-a-Comp Enoxaparin Orudis Zorpin  Buff-a-Comp with Codeine Equegesic Os-Cal-Gesic   Buffaprin Excedrin plain, buffered or Extra Strength Oxalid   Bufferin Arthritis Strength Feldene Oxphenbutazone   Bufferin plain or Extra Strength Feldene Capsules Oxycodone with Aspirin   Bufferin with Codeine Fenoprofen Fenoprofen Pabalate or Pabalate-SF   Buffets II Flogesic Panagesic   Buffinol plain or Extra Strength Florinal or Florinal with Codeine Panwarfarin   Buf-Tabs Flurbiprofen Penicillamine   Butalbital Compound Four-way cold tablets Penicillin   Butazolidin Fragmin Pepto-Bismol   Carbenicillin Geminisyn Percodan   Carna Arthritis Reliever Geopen Persantine   Carprofen Gold's salt Persistin   Chloramphenicol Goody's Phenylbutazone   Chloromycetin Haltrain Piroxlcam   Clmetidine heparin Plaquenil   Cllnoril Hyco-pap Ponstel   Clofibrate Hydroxy chloroquine Propoxyphen         Before stopping any of these medications, be sure to consult the physician who ordered them.  Some, such as Coumadin (Warfarin) are ordered to prevent or treat serious conditions such as "deep thrombosis", "pumonary embolisms", and other heart problems.  The amount of time that you may need off of the medication may also vary with the medication and the reason for which you were taking it.  If you are taking any of these medications, please make sure you notify your pain physician before you undergo any procedures.

## 2015-07-08 NOTE — Progress Notes (Signed)
Safety precautions to be maintained throughout the outpatient stay will include: orient to surroundings, keep bed in low position, maintain call bell within reach at all times, provide assistance with transfer out of bed and ambulation.  

## 2015-08-05 ENCOUNTER — Encounter: Payer: Self-pay | Admitting: Pain Medicine

## 2015-08-05 ENCOUNTER — Ambulatory Visit: Payer: Medicare Other | Attending: Pain Medicine | Admitting: Pain Medicine

## 2015-08-05 VITALS — BP 91/46 | HR 69 | Temp 97.9°F | Resp 16 | Ht 62.0 in | Wt 179.0 lb

## 2015-08-05 DIAGNOSIS — M545 Low back pain: Secondary | ICD-10-CM | POA: Diagnosis present

## 2015-08-05 DIAGNOSIS — M79605 Pain in left leg: Secondary | ICD-10-CM | POA: Diagnosis present

## 2015-08-05 DIAGNOSIS — Z981 Arthrodesis status: Secondary | ICD-10-CM

## 2015-08-05 DIAGNOSIS — G5620 Lesion of ulnar nerve, unspecified upper limb: Secondary | ICD-10-CM

## 2015-08-05 DIAGNOSIS — M47816 Spondylosis without myelopathy or radiculopathy, lumbar region: Secondary | ICD-10-CM

## 2015-08-05 DIAGNOSIS — M79604 Pain in right leg: Secondary | ICD-10-CM | POA: Diagnosis present

## 2015-08-05 DIAGNOSIS — M5030 Other cervical disc degeneration, unspecified cervical region: Secondary | ICD-10-CM

## 2015-08-05 DIAGNOSIS — M461 Sacroiliitis, not elsewhere classified: Secondary | ICD-10-CM

## 2015-08-05 DIAGNOSIS — M47812 Spondylosis without myelopathy or radiculopathy, cervical region: Secondary | ICD-10-CM

## 2015-08-05 DIAGNOSIS — M533 Sacrococcygeal disorders, not elsewhere classified: Secondary | ICD-10-CM

## 2015-08-05 DIAGNOSIS — M5136 Other intervertebral disc degeneration, lumbar region: Secondary | ICD-10-CM | POA: Diagnosis not present

## 2015-08-05 DIAGNOSIS — Q761 Klippel-Feil syndrome: Secondary | ICD-10-CM

## 2015-08-05 MED ORDER — LIDOCAINE HCL (PF) 1 % IJ SOLN: 10.0000 mL | Freq: Once | INTRAMUSCULAR | Status: DC

## 2015-08-05 MED ORDER — FENTANYL CITRATE (PF) 100 MCG/2ML IJ SOLN: 100.0000 ug | Freq: Once | INTRAMUSCULAR | Status: DC

## 2015-08-05 MED ORDER — LIDOCAINE HCL (PF) 1 % IJ SOLN
INTRAMUSCULAR | Status: AC
  Administered 2015-08-05: 10:00:00
  Filled 2015-08-05: qty 5

## 2015-08-05 MED ORDER — MIDAZOLAM HCL 5 MG/5ML IJ SOLN
INTRAMUSCULAR | Status: AC
  Administered 2015-08-05: 3 mg
  Filled 2015-08-05: qty 5

## 2015-08-05 MED ORDER — BUPIVACAINE HCL (PF) 0.25 % IJ SOLN: 30.0000 mL | Freq: Once | INTRAMUSCULAR | Status: DC

## 2015-08-05 MED ORDER — FENTANYL CITRATE (PF) 100 MCG/2ML IJ SOLN
INTRAMUSCULAR | Status: AC
  Administered 2015-08-05: 50 ug
  Filled 2015-08-05: qty 2

## 2015-08-05 MED ORDER — OXYCODONE-ACETAMINOPHEN 5-325 MG PO TABS: ORAL_TABLET | ORAL | Status: DC

## 2015-08-05 MED ORDER — CIPROFLOXACIN IN D5W 400 MG/200ML IV SOLN: 400.0000 mg | Freq: Once | INTRAVENOUS | Status: DC

## 2015-08-05 MED ORDER — SODIUM CHLORIDE 0.9 % IJ SOLN
INTRAMUSCULAR | Status: AC
  Administered 2015-08-05: 10:00:00
  Filled 2015-08-05: qty 10

## 2015-08-05 MED ORDER — ORPHENADRINE CITRATE 30 MG/ML IJ SOLN
INTRAMUSCULAR | Status: AC
  Filled 2015-08-05: qty 2

## 2015-08-05 MED ORDER — CIPROFLOXACIN IN D5W 400 MG/200ML IV SOLN
INTRAVENOUS | Status: AC
  Administered 2015-08-05: 400 mg via INTRAVENOUS
  Filled 2015-08-05: qty 200

## 2015-08-05 MED ORDER — ORPHENADRINE CITRATE 30 MG/ML IJ SOLN: 60.0000 mg | Freq: Once | INTRAMUSCULAR | Status: DC

## 2015-08-05 MED ORDER — BUPIVACAINE HCL (PF) 0.25 % IJ SOLN
INTRAMUSCULAR | Status: AC
  Filled 2015-08-05: qty 30

## 2015-08-05 MED ORDER — CIPROFLOXACIN HCL 250 MG PO TABS: 250.0000 mg | ORAL_TABLET | Freq: Two times a day (BID) | ORAL | Status: DC

## 2015-08-05 MED ORDER — LACTATED RINGERS IV SOLN: 1000.0000 mL | INTRAVENOUS | Status: DC

## 2015-08-05 MED ORDER — MORPHINE SULFATE ER 15 MG PO TBCR: EXTENDED_RELEASE_TABLET | ORAL | Status: DC

## 2015-08-05 MED ORDER — MIDAZOLAM HCL 5 MG/5ML IJ SOLN: 5.0000 mg | Freq: Once | INTRAMUSCULAR | Status: DC

## 2015-08-05 NOTE — Progress Notes (Signed)
Safety precautions to be maintained throughout the outpatient stay will include: orient to surroundings, keep bed in low position, maintain call bell within reach at all times, provide assistance with transfer out of bed and ambulation.  

## 2015-08-05 NOTE — Progress Notes (Signed)
Subjective:    Patient ID: Sabrina Stanley, female    DOB: 26-Sep-1960, 54 y.o.   MRN: [id number]  HPI  PROCEDURE PERFORMED: Radiofrequency rhizolysis (lunbar facet medial branch nerve radiofrequency rhizolysis).  The procedure was performed with the COOLIEF technique.  COOLIEF technique was performed using the Synergy cooled radiofrequency probe [id number]-[id number], the synergy cooled radiofrequency introducer [id number], and the synergy cooled sterile tube kit [id number].   HISTORY: The patient is a 54 y.o. female who returns to the [facility] for further evaluation of severely disabling pain involving the lumbar and lower extremity region.  MRI revealed Degenerative disc disease lumbar spine Degenerative changes L2-3 L4, L4-5, and L5-S1 most significantly involved   There is concern regarding significant component of patient's pain being due to facet arthropathy with facet syndrome. The patient has had significant relief of pain following previous lumbar facet, medial branch nerve, blocks. Radiofrequency rhizolysis of the lumbar facets, medial branch nerves, will be performed at this time an attempt to provide longer lasting relief of the patient's pain, minimize progression of the patient's symptoms, and avoid the need for more involved treatment. The risks, benefits and expectations of the procedure have been discussed and explained to the patient who was with understanding and wished to proceed with interventional treatment in an attempt to decrease severely  disabling pain of the lumbar and lower extremity region.  We will proceed with what is felt to be a medically necessary procedure, radiofrequency rhizolysis (lumbar facet medial branch nerve radiofrequency rhizolysis) using the COOLIEF technique.  DESCRIPTION OF PROCEDURE: COOLIEF Technique Radiofrequency Rhizolysis of Lumbar Facets (Medial Branch Nerves Radiofrequency Rhizolysis) with IV Versed, IV Fentanyl conscious sedation, EKG, blood  pressure, pulse, and pulse oximetry monitoring.  The procedure was performed with the patient in the prone position under fluoroscopic guidance.  Following Betadine prep of the proposed entry site, a 1.5% lidocaine plain skin wheal of the proposed needle entry site was  prepared in oblique orientation.   Right, L5 Radiofrequency Rhizolysis L5 Facet  (Medial Branch Nerve): Under fluoroscopic guidance, the radiofrequency needle was inserted at the L5 vertebral body level after a local anesthetic skin wheal of 1.5% lidocaine plain and Betadine prep of the proposed needle entry site was performed.  The needle was inserted under fluoroscopic guidance in the area known as Burton's eye or eye of the Scotty dog with needle placed at the superior and lateral border of the targeted area with oblique orientation utilizing the tunnel (gun  barrel approach) technique to the targeted area.  Right Radiofrequency Rhizolysis at L4 and L3 Lumbar Facets (Medial Branch Nerves) The procedure was performed at L4 and L 3 exactly as was performed at the L5 and under fluoroscopic guidance utilizing the identical technique as utilized at the L5 vertebral body level.   Needle was then verified on lateral view and following needle placement verification on lateral view, radiofrequency testing was then carried out with motor testing at 2 Hz stimulation without evidence of stimulation of the lower extremity. Following radiofrequency testing for motor testing, each radiofrequency probe was then prepared with 2 mL of preservative-free lidocaine and following 1 minute of anesthetizing each proposed radiofrequency lesioning  Level, the radiofrequency probe was then inserted in the right, L5 vertebral body level needle with radiofrequency lesioning carried out for 2-1/2 minutes with temperature of the tissue being maintained at 80 degrees centigrade for 2-1/2 minutes.  Radiofrequency probe was then inserted in the right, L4 vertebral body  level needle and  radiofrequency lesioning was performed at 80 degrees centigrade tissue temperature for 2-1/2 minutes.  Radiofrequency probe was then inserted in the right L3 vertebral body level needle and radiofrequency lesioning was performed at 80 degrees centigrade tissue temperature for 2-1/2 minutes  The patient tolerated the procedure well.   PLAN: 1. Medications: The patient will continue the present medications MS Contin and oxycodone acetaminophen 2. The patient will follow up with Dr. Doy Hutching regarding blood pressure and general medical condition as discussed with the patient.  3. Surgical evaluation as discussed. 4. Neurological evaluation as discussed.  5. The patient has been advised to call the [facility] prior to scheduled return appointment should there be significant change in the patient's condition or should the patient have other concerns regarding condition prior to scheduled return appointment.  The patient is with understanding and is in agreement with suggested treatment plan.    Review of Systems     Objective:   Physical Exam        Assessment & Plan:

## 2015-08-05 NOTE — Patient Instructions (Signed)
Radiofrequency Lesioning Radiofrequency lesioning is a procedure that is performed to relieve pain. The procedure is often used for back, neck, or arm pain. Radiofrequency lesioning involves the use of a machine that creates radio waves to make heat. During the procedure, the heat is applied to the nerve that carries the pain signal. The heat damages the nerve and interferes with the pain signal. Pain relief usually lasts for 6 months to 1 year. LET [facility] CARE PROVIDER KNOW ABOUT:  Any allergies you have.  All medicines you are taking, including vitamins, herbs, eye drops, creams, and over-the-counter medicines.  Previous problems you or members of your family have had with the use of anesthetics.  Any blood disorders you have.  Previous surgeries you have had.  Any medical conditions you have.  Whether you are pregnant or may be pregnant. RISKS AND COMPLICATIONS Generally, this is a safe procedure. However, problems may occur, including:  Pain or soreness at the injection site.  Infection at the injection site.  Damage to nerves or blood vessels. BEFORE THE PROCEDURE  Ask your health care provider about:  Changing or stopping your regular medicines. This is especially important if you are taking diabetes medicines or blood thinners.  Taking medicines such as aspirin and ibuprofen. These medicines can thin your blood. Do not take these medicines before your procedure if your health care provider instructs you not to.  Follow instructions from your health care provider about eating or drinking restrictions.  Plan to have someone take you home after the procedure.  If you go home right after the procedure, plan to have someone with you for 24 hours. PROCEDURE  You will be given one or more of the following:  A medicine to help you relax (sedative).  A medicine to numb the area (local anesthetic).  You will be awake during the procedure. You will need to be able to  talk with the health care provider during the procedure.  With the help of a type of X-ray (fluoroscopy), the health care provider will insert a radiofrequency needle into the area to be treated.  Next, a wire that carries the radio waves (electrode) will be put through the radiofrequency needle. An electrical pulse will be sent through the electrode to verify the correct nerve. You will feel a tingling sensation, and you may have muscle twitching.  Then, the tissue that is around the needle tip will be heated by an electric current that is passed using the radiofrequency machine. This will numb the nerves.  A bandage (dressing) will be put on the insertion area after the procedure is done. The procedure may vary among health care providers and hospitals. AFTER THE PROCEDURE  Your blood pressure, heart rate, breathing rate, and blood oxygen level will be monitored often until the medicines you were given have worn off.  Return to your normal activities as directed by your health care provider.   This information is not intended to replace advice given to you by your health care provider. Make sure you discuss any questions you have with your health care provider.   Document Released: 04/13/2011 Document Revised: 05/06/2015 Document Reviewed: 09/22/2014 Elsevier Interactive Patient Education 2016 [employer] Pain Management Discharge Instructions  General Discharge Instructions :  If you need to reach your doctor call: Monday-Friday 8:00 am - 4:00 pm at [phone] or toll free [phone].  After clinic hours [phone] to have operator reach doctor.  Bring all of your medication bottles to all your  appointments in the pain clinic.  To cancel or reschedule your appointment with Pain Management please remember to call 24 hours in advance to avoid a fee.  Refer to the educational materials which you have been given on: General Risks, I had my Procedure. Discharge Instructions,  Post Sedation.  Post Procedure Instructions:  The drugs you were given will stay in your system until tomorrow, so for the next 24 hours you should not drive, make any legal decisions or drink any alcoholic beverages.  You may eat anything you prefer, but it is better to start with liquids then soups and crackers, and gradually work up to solid foods.  Please notify your doctor immediately if you have any unusual bleeding, trouble breathing or pain that is not related to your normal pain.  Depending on the type of procedure that was done, some parts of your body may feel week and/or numb.  This usually clears up by tonight or the next day.  Walk with the use of an assistive device or accompanied by an adult for the 24 hours.  You may use ice on the affected area for the first 24 hours.  Put ice in a Ziploc bag and cover with a towel and place against area 15 minutes on 15 minutes off.  You may switch to heat after 24 hours.

## 2015-08-06 ENCOUNTER — Telehealth: Payer: Self-pay | Admitting: *Deleted

## 2015-08-06 NOTE — Telephone Encounter (Signed)
Message left

## 2015-08-28 DIAGNOSIS — F3170 Bipolar disorder, currently in remission, most recent episode unspecified: Secondary | ICD-10-CM | POA: Insufficient documentation

## 2015-09-03 ENCOUNTER — Encounter: Payer: Self-pay | Admitting: Pain Medicine

## 2015-09-03 ENCOUNTER — Other Ambulatory Visit: Payer: Self-pay | Admitting: Pain Medicine

## 2015-09-03 ENCOUNTER — Ambulatory Visit: Payer: Medicare Other | Attending: Pain Medicine | Admitting: Pain Medicine

## 2015-09-03 VITALS — BP 106/66 | HR 98 | Temp 97.8°F | Resp 16 | Ht 62.0 in | Wt 178.0 lb

## 2015-09-03 DIAGNOSIS — M545 Low back pain: Secondary | ICD-10-CM | POA: Diagnosis present

## 2015-09-03 DIAGNOSIS — M461 Sacroiliitis, not elsewhere classified: Secondary | ICD-10-CM

## 2015-09-03 DIAGNOSIS — M542 Cervicalgia: Secondary | ICD-10-CM | POA: Diagnosis present

## 2015-09-03 DIAGNOSIS — G5620 Lesion of ulnar nerve, unspecified upper limb: Secondary | ICD-10-CM

## 2015-09-03 DIAGNOSIS — M5136 Other intervertebral disc degeneration, lumbar region: Secondary | ICD-10-CM | POA: Diagnosis not present

## 2015-09-03 DIAGNOSIS — M533 Sacrococcygeal disorders, not elsewhere classified: Secondary | ICD-10-CM

## 2015-09-03 DIAGNOSIS — M5030 Other cervical disc degeneration, unspecified cervical region: Secondary | ICD-10-CM | POA: Diagnosis not present

## 2015-09-03 DIAGNOSIS — Z981 Arthrodesis status: Secondary | ICD-10-CM | POA: Insufficient documentation

## 2015-09-03 DIAGNOSIS — M47816 Spondylosis without myelopathy or radiculopathy, lumbar region: Secondary | ICD-10-CM

## 2015-09-03 DIAGNOSIS — Q761 Klippel-Feil syndrome: Secondary | ICD-10-CM

## 2015-09-03 DIAGNOSIS — M47812 Spondylosis without myelopathy or radiculopathy, cervical region: Secondary | ICD-10-CM

## 2015-09-03 DIAGNOSIS — M5481 Occipital neuralgia: Secondary | ICD-10-CM | POA: Insufficient documentation

## 2015-09-03 MED ORDER — OXYCODONE-ACETAMINOPHEN 5-325 MG PO TABS: ORAL_TABLET | ORAL | Status: DC

## 2015-09-03 MED ORDER — MORPHINE SULFATE ER 15 MG PO TBCR: EXTENDED_RELEASE_TABLET | ORAL | Status: DC

## 2015-09-03 NOTE — Progress Notes (Signed)
Safety precautions to be maintained throughout the outpatient stay will include: orient to surroundings, keep bed in low position, maintain call bell within reach at all times, provide assistance with transfer out of bed and ambulation.  

## 2015-09-03 NOTE — Progress Notes (Signed)
   Subjective:    Patient ID: Sabrina Stanley, female    DOB: Oct 29, 1960, 55 y.o.   MRN: [id number]  HPI    Review of Systems     Objective:   Physical Exam        Assessment & Plan:

## 2015-09-03 NOTE — Progress Notes (Signed)
   Subjective:    Patient ID: Sabrina Stanley, female    DOB: 09-Mar-1961, 55 y.o.   MRN: [id number]  HPI  The patient is a 55 year old female who returns to pain management Center for further evaluation and treatment of pain involving the neck upper extremity regions upper mid lower back and lower extremity regions. The patient is status post radial freaks rhizolysis lumbar facet, medial branch nerves. The patient states that she has significant improvement of her ability to perform activities of daily living since undergoing radiofrequency procedure. The patient states that she can stand for longer periods of time without experiencing significant lumbar and lower extremity pain. We discussed patient's overall condition and informed patient that we could consider patient for additional radiofrequency procedures of the lumbar region in attempt to decrease the pain was significantly as well as decrease progression of patient's symptoms. At the present time we will continue patient's medications and will await follow-up evaluations to consider further modifications of treatment regimen. The patient stated she was quite please with the level of the amount of pain relief she had obtained and will inform us if and when she wishes to proceed with additional radiofrequency procedure. All agreed to suggested treatment plan.     Review of Systems     Objective:   Physical Exam  There was tenderness to palpation of the splenius capitis and occipitalis region of mild degree with well-healed surgical scar of the cervical region without increased warmth and erythema in the region scar. There was tends to palpation over the region of  thoracic facet thoracic paraspinal musculature region with no crepitus of the thoracic region noted. Tinel and Phalen's maneuver were without increased pain of significant degree. The patient appeared to be with slightly decreased grip strength. There was unremarkable Spurling's  maneuver. Palpation over the lumbar paraspinal must reason lumbar facet region was mild to moderate tenderness to palpation. Palpation of the PSIS and PII S region reproduced mild to moderate discomfort. There was mild to moderate tenderness over the gluteal and piriformis musculature region as well as the greater trochanteric region and iliotibial band region. Straight leg raise was tolerates approximately 20 without increased pain with dorsiflexion noted. EHL strength appeared to be slightly decreased with no sensory deficit or dermatomal distribution detected. There appeared to be negative clonus negative Homans. He also difficult to this patient had difficulty relaxing. Abdomen nontender with no costovertebral tenderness noted.     Assessment & Plan:      Degenerative disc disease lumbar spine Degenerative changes L2-3 L4, L4-5, and L5-S1 most significantly involved  Lumbar facet syndrome  Sacroiliac joint dysfunction  Degenerative disc disease cervical spine Status post cervical fusion  Carpal tunnel syndrome versus cervical radiculopathy versus ulnar neuropathy  Bilateral occipital neuralgia   PLAN   Continue present medication Percocet and MS Cont  F/U PCP Dr. Doy Hutching for evaliation of  BP and general medical  condition  F/U surgical evaluation. May consider pending follow-up evaluations. May follow up with Dr.Menz as discussed   F/U neurological evaluation Patient will follow up with Dr. Manuella Ghazi as planned  F/U vascular evaluation  Call pain management prior to return appointment for any concerns

## 2015-09-03 NOTE — Patient Instructions (Signed)
PLAN   Continue present medication Percocet and MS Contin  Radiofrequency rhizolysis lumbar facet, medial branch nerves to be performed at return appointment  F/U PCP Dr. Doy Hutching for evaliation of  BP and general medical  condition  F/U surgical evaluation. May consider pending follow-up evaluations. May follow up with Dr.Menz as discussed   F/U neurological evaluation Patient will follow up with Dr. Manuella Ghazi as planned  F/U vascular evaluation  Call pain management prior to return appointment for any concerns

## 2015-09-09 ENCOUNTER — Other Ambulatory Visit: Payer: Self-pay | Admitting: Internal Medicine

## 2015-09-09 DIAGNOSIS — R1310 Dysphagia, unspecified: Secondary | ICD-10-CM

## 2015-09-10 ENCOUNTER — Ambulatory Visit
Admission: RE | Admit: 2015-09-10 | Discharge: 2015-09-10 | Disposition: A | Discharge status: Home / Self Care | Payer: Medicare Other | Source: Ambulatory Visit | Attending: Internal Medicine | Admitting: Internal Medicine

## 2015-09-10 DIAGNOSIS — R1310 Dysphagia, unspecified: Secondary | ICD-10-CM | POA: Diagnosis present

## 2015-09-12 LAB — TOXASSURE SELECT 13 (MW), URINE: PDF: 0

## 2015-09-16 ENCOUNTER — Encounter: Payer: Self-pay | Admitting: *Deleted

## 2015-09-17 ENCOUNTER — Ambulatory Visit: Payer: Medicare Other | Admitting: Anesthesiology

## 2015-09-17 ENCOUNTER — Encounter
Admission: RE | Disposition: A | Discharge status: Home / Self Care | Payer: Self-pay | Source: Ambulatory Visit | Attending: Gastroenterology

## 2015-09-17 ENCOUNTER — Ambulatory Visit
Admission: RE | Admit: 2015-09-17 | Discharge: 2015-09-17 | Disposition: A | Discharge status: Home / Self Care | Payer: Medicare Other | Source: Ambulatory Visit | Attending: Gastroenterology | Admitting: Gastroenterology

## 2015-09-17 ENCOUNTER — Encounter: Payer: Self-pay | Admitting: *Deleted

## 2015-09-17 DIAGNOSIS — K210 Gastro-esophageal reflux disease with esophagitis: Secondary | ICD-10-CM | POA: Diagnosis not present

## 2015-09-17 DIAGNOSIS — Z7951 Long term (current) use of inhaled steroids: Secondary | ICD-10-CM | POA: Diagnosis not present

## 2015-09-17 DIAGNOSIS — I10 Essential (primary) hypertension: Secondary | ICD-10-CM | POA: Insufficient documentation

## 2015-09-17 DIAGNOSIS — Z79899 Other long term (current) drug therapy: Secondary | ICD-10-CM | POA: Insufficient documentation

## 2015-09-17 DIAGNOSIS — F1120 Opioid dependence, uncomplicated: Secondary | ICD-10-CM | POA: Diagnosis not present

## 2015-09-17 DIAGNOSIS — E559 Vitamin D deficiency, unspecified: Secondary | ICD-10-CM | POA: Insufficient documentation

## 2015-09-17 DIAGNOSIS — F3189 Other bipolar disorder: Secondary | ICD-10-CM | POA: Diagnosis not present

## 2015-09-17 DIAGNOSIS — M1990 Unspecified osteoarthritis, unspecified site: Secondary | ICD-10-CM | POA: Insufficient documentation

## 2015-09-17 DIAGNOSIS — K219 Gastro-esophageal reflux disease without esophagitis: Secondary | ICD-10-CM | POA: Insufficient documentation

## 2015-09-17 DIAGNOSIS — M797 Fibromyalgia: Secondary | ICD-10-CM | POA: Diagnosis not present

## 2015-09-17 DIAGNOSIS — F17210 Nicotine dependence, cigarettes, uncomplicated: Secondary | ICD-10-CM | POA: Diagnosis not present

## 2015-09-17 DIAGNOSIS — J449 Chronic obstructive pulmonary disease, unspecified: Secondary | ICD-10-CM | POA: Diagnosis not present

## 2015-09-17 DIAGNOSIS — R1310 Dysphagia, unspecified: Secondary | ICD-10-CM | POA: Insufficient documentation

## 2015-09-17 DIAGNOSIS — M8580 Other specified disorders of bone density and structure, unspecified site: Secondary | ICD-10-CM | POA: Diagnosis not present

## 2015-09-17 DIAGNOSIS — G4730 Sleep apnea, unspecified: Secondary | ICD-10-CM | POA: Diagnosis not present

## 2015-09-17 DIAGNOSIS — G43909 Migraine, unspecified, not intractable, without status migrainosus: Secondary | ICD-10-CM | POA: Insufficient documentation

## 2015-09-17 HISTORY — DX: Cervicalgia: M54.2

## 2015-09-17 HISTORY — DX: Psoriasis, unspecified: L40.9

## 2015-09-17 HISTORY — DX: Diaphragmatic hernia without obstruction or gangrene: K44.9

## 2015-09-17 HISTORY — DX: Tobacco use: Z72.0

## 2015-09-17 HISTORY — DX: Gastro-esophageal reflux disease without esophagitis: K21.9

## 2015-09-17 HISTORY — DX: Other chronic pain: G89.29

## 2015-09-17 HISTORY — DX: Other specified postprocedural states: Z98.890

## 2015-09-17 HISTORY — DX: Fibromyalgia: M79.7

## 2015-09-17 HISTORY — DX: Pneumonia, unspecified organism: J18.9

## 2015-09-17 HISTORY — DX: Dermatitis, unspecified: L30.9

## 2015-09-17 HISTORY — DX: Bronchitis, not specified as acute or chronic: J40

## 2015-09-17 HISTORY — DX: Solitary cyst of right breast: N60.01

## 2015-09-17 HISTORY — PX: ESOPHAGOGASTRODUODENOSCOPY: SHX5428

## 2015-09-17 HISTORY — DX: Alcohol dependence, uncomplicated: F10.20

## 2015-09-17 HISTORY — DX: Toxic effect of other metals, accidental (unintentional), initial encounter: T56.891A

## 2015-09-17 HISTORY — DX: Peripheral vascular disease, unspecified: I73.9

## 2015-09-17 HISTORY — DX: Essential (primary) hypertension: I10

## 2015-09-17 HISTORY — DX: Myalgic encephalomyelitis/chronic fatigue syndrome: G93.32

## 2015-09-17 HISTORY — DX: Insomnia, unspecified: G47.00

## 2015-09-17 HISTORY — DX: Pleurisy: R09.1

## 2015-09-17 HISTORY — DX: Irregular menstruation, unspecified: N92.6

## 2015-09-17 HISTORY — DX: Chronic fatigue, unspecified: R53.82

## 2015-09-17 HISTORY — DX: Allergy, unspecified, initial encounter: T78.40XA

## 2015-09-17 HISTORY — DX: Anemia, unspecified: D64.9

## 2015-09-17 HISTORY — DX: Headache: R51

## 2015-09-17 HISTORY — DX: Panic disorder (episodic paroxysmal anxiety): F41.0

## 2015-09-17 HISTORY — DX: Sleep apnea, unspecified: G47.30

## 2015-09-17 HISTORY — DX: Solitary cyst of left breast: N60.02

## 2015-09-17 HISTORY — DX: Headache, unspecified: R51.9

## 2015-09-17 HISTORY — DX: Noninfective gastroenteritis and colitis, unspecified: K52.9

## 2015-09-17 HISTORY — DX: Spinal stenosis, cervical region: M48.02

## 2015-09-17 HISTORY — DX: Myalgia, unspecified site: M79.10

## 2015-09-17 HISTORY — DX: Disease of blood and blood-forming organs, unspecified: D75.9

## 2015-09-17 HISTORY — DX: Hypothyroidism, unspecified: E03.9

## 2015-09-17 HISTORY — DX: Vitamin D deficiency, unspecified: E55.9

## 2015-09-17 SURGERY — EGD (ESOPHAGOGASTRODUODENOSCOPY)
Anesthesia: General

## 2015-09-17 MED ORDER — FENTANYL CITRATE (PF) 100 MCG/2ML IJ SOLN
INTRAMUSCULAR | Status: DC | PRN
  Administered 2015-09-17: 50 ug via INTRAVENOUS

## 2015-09-17 MED ORDER — SODIUM CHLORIDE 0.9 % IV SOLN
INTRAVENOUS | Status: DC
  Administered 2015-09-17 (×2): via INTRAVENOUS

## 2015-09-17 MED ORDER — PROPOFOL 10 MG/ML IV BOLUS
INTRAVENOUS | Status: DC | PRN
  Administered 2015-09-17: 20 mg via INTRAVENOUS
  Administered 2015-09-17: 100 mg via INTRAVENOUS

## 2015-09-17 MED ORDER — MIDAZOLAM HCL 2 MG/2ML IJ SOLN
INTRAMUSCULAR | Status: DC | PRN
  Administered 2015-09-17: 1 mg via INTRAVENOUS

## 2015-09-17 NOTE — Anesthesia Preprocedure Evaluation (Signed)
Anesthesia Evaluation  Patient identified by MRN, date of birth, ID band Patient awake    Reviewed: Allergy & Precautions, NPO status , Patient's Chart, lab work & pertinent test results  History of Anesthesia Complications Negative for: history of anesthetic complications  Airway Mallampati: I       Dental   Pulmonary neg pulmonary ROS, sleep apnea (pt not using CPAP) , pneumonia, COPD,  COPD inhaler, Current Smoker,           Cardiovascular Hypertension: no meds. Pt. on medications negative cardio ROS       Neuro/Psych Anxiety Depression Bipolar Disorder negative neurological ROS     GI/Hepatic hiatal hernia, GERD  Medicated and Poorly Controlled,  Endo/Other  Hypothyroidism (pt denies)   Renal/GU negative Renal ROS     Musculoskeletal  (+) Arthritis , Fibromyalgia -, narcotic dependent  Abdominal   Peds  Hematology  (+) anemia ,   Anesthesia Other Findings   Reproductive/Obstetrics                             Anesthesia Physical Anesthesia Plan  ASA: III  Anesthesia Plan: General   Post-op Pain Management:    Induction: Intravenous  Airway Management Planned: Nasal Cannula  Additional Equipment:   Intra-op Plan:   Post-operative Plan:   Informed Consent: I have reviewed the patients History and Physical, chart, labs and discussed the procedure including the risks, benefits and alternatives for the proposed anesthesia with the patient or authorized representative who has indicated his/her understanding and acceptance.     Plan Discussed with:   Anesthesia Plan Comments:         Anesthesia Quick Evaluation

## 2015-09-17 NOTE — Transfer of Care (Signed)
Immediate Anesthesia Transfer of Care Note  Patient: Sabrina Stanley  Procedure(s) Performed: Procedure(s): ESOPHAGOGASTRODUODENOSCOPY (EGD) (N/A)  Patient Location: PACU and Endoscopy Unit  Anesthesia Type:General  Level of Consciousness: awake, alert  and oriented  Airway & Oxygen Therapy: Patient Spontanous Breathing and Patient connected to nasal cannula oxygen  Post-op Assessment: Report given to RN and Post -op Vital signs reviewed and stable  Post vital signs: Reviewed and stable  Last Vitals:  Filed Vitals:   09/17/15 1446 09/17/15 1538  BP: 102/48 124/71  Pulse: 71   Temp: 36.2 C 36.3 C  Resp: 12 18    Complications: No apparent anesthesia complications

## 2015-09-17 NOTE — H&P (Signed)
  Date of Initial H&P: 09/11/2015  History reviewed, patient examined, no change in status, stable for surgery.

## 2015-09-17 NOTE — Anesthesia Postprocedure Evaluation (Signed)
Anesthesia Post Note  Patient: Sabrina Stanley  Procedure(s) Performed: Procedure(s) (LRB): ESOPHAGOGASTRODUODENOSCOPY (EGD) (N/A)  Patient location during evaluation: Endoscopy Anesthesia Type: General Level of consciousness: awake and alert Pain management: pain level controlled Vital Signs Assessment: post-procedure vital signs reviewed and stable Respiratory status: spontaneous breathing, nonlabored ventilation, respiratory function stable and patient connected to nasal cannula oxygen Cardiovascular status: blood pressure returned to baseline and stable Postop Assessment: no signs of nausea or vomiting Anesthetic complications: no    Last Vitals:  Filed Vitals:   09/17/15 1558 09/17/15 1608  BP: 114/74 140/80  Pulse: 78 75  Temp:    Resp: 17 11    Last Pain:  Filed Vitals:   09/17/15 1610  PainSc: 6                  Martha Clan

## 2015-09-17 NOTE — Op Note (Signed)
[facility] Patient Name: Sabrina Stanley Procedure Date: 09/17/2015 3:17 PM MRN: [id number] Account #: [id number] Date of Birth: Apr 08, 1961 Admit Type: Outpatient Age: 55 Room: [facility] ENDO ROOM 4 Gender: Female Note Status: Finalized Procedure:         Upper GI endoscopy Indications:       Dysphagia, Abnormal UGI series Providers:         Lupita Dawn. Candace Cruise, MD Referring MD:      Leonie Douglas. Doy Hutching, MD (Referring MD) Medicines:         Monitored Anesthesia Care Complications:     No immediate complications. Procedure:         Pre-Anesthesia Assessment:                    - Prior to the procedure, a History and Physical was                     performed, and patient medications, allergies and                     sensitivities were reviewed. The patient's tolerance of                     previous anesthesia was reviewed.                    - The risks and benefits of the procedure and the sedation                     options and risks were discussed with the patient. All                     questions were answered and informed consent was obtained.                    - After reviewing the risks and benefits, the patient was                     deemed in satisfactory condition to undergo the procedure.                    After obtaining informed consent, the endoscope was passed                     under direct vision. Throughout the procedure, the                     patient's blood pressure, pulse, and oxygen saturations                     were monitored continuously. The Endoscope was introduced                     through the mouth, and advanced to the second part of                     duodenum. The upper GI endoscopy was accomplished without                     difficulty. The patient tolerated the procedure well. Findings:      LA Grade A (one or more mucosal breaks less than 5 mm, not extending       between tops of 2 mucosal folds) esophagitis was  found in the lower  third of the esophagus. The scope was withdrawn. Dilation was performed       with a Maloney dilator with moderate resistance at 54 Fr.      The exam was otherwise without abnormality.      The entire examined stomach was normal.      The examined duodenum was normal.      No obvious stricture in upper esophagus though resistance in passing       scope. Impression:        - LA Grade A reflux esophagitis. Dilated.                    - The examination was otherwise normal.                    - Normal stomach.                    - Normal examined duodenum.                    - No specimens collected. Recommendation:    - Discharge patient to home.                    - Observe patient's clinical course.                    - Use Prilosec (omeprazole) 20 mg PO daily daily. Procedure Code(s): --- Professional ---                    [phone], Esophagogastroduodenoscopy, flexible, transoral;                     diagnostic, including collection of specimen(s) by                     brushing or washing, when performed (separate procedure)                    43450, Dilation of esophagus, by unguided sound or bougie,                     single or multiple passes Diagnosis Code(s): --- Professional ---                    K21.0, Gastro-esophageal reflux disease with esophagitis                    R13.10, Dysphagia, unspecified                    R93.3, Abnormal findings on diagnostic imaging of other                     parts of digestive tract CPT copyright 2014 [employer]. All rights reserved. The codes documented in this report are preliminary and upon coder review may  be revised to meet current compliance requirements. Hulen Luster, MD 09/17/2015 3:30:58 PM This report has been signed electronically. Number of Addenda: 0 Note Initiated On: 09/17/2015 3:17 PM      [facility]

## 2015-09-21 ENCOUNTER — Encounter: Payer: Self-pay | Admitting: Gastroenterology

## 2015-09-29 ENCOUNTER — Ambulatory Visit: Payer: Medicare Other | Attending: Pain Medicine | Admitting: Pain Medicine

## 2015-09-29 ENCOUNTER — Encounter: Payer: Self-pay | Admitting: Pain Medicine

## 2015-09-29 VITALS — BP 106/68 | HR 78 | Temp 98.2°F | Resp 16 | Ht 62.0 in | Wt 174.0 lb

## 2015-09-29 DIAGNOSIS — M79606 Pain in leg, unspecified: Secondary | ICD-10-CM | POA: Diagnosis present

## 2015-09-29 DIAGNOSIS — M542 Cervicalgia: Secondary | ICD-10-CM | POA: Diagnosis present

## 2015-09-29 DIAGNOSIS — M533 Sacrococcygeal disorders, not elsewhere classified: Secondary | ICD-10-CM | POA: Diagnosis not present

## 2015-09-29 DIAGNOSIS — M546 Pain in thoracic spine: Secondary | ICD-10-CM | POA: Diagnosis present

## 2015-09-29 DIAGNOSIS — Z981 Arthrodesis status: Secondary | ICD-10-CM | POA: Diagnosis not present

## 2015-09-29 DIAGNOSIS — M5481 Occipital neuralgia: Secondary | ICD-10-CM | POA: Diagnosis not present

## 2015-09-29 DIAGNOSIS — M5136 Other intervertebral disc degeneration, lumbar region: Secondary | ICD-10-CM | POA: Diagnosis not present

## 2015-09-29 DIAGNOSIS — M461 Sacroiliitis, not elsewhere classified: Secondary | ICD-10-CM

## 2015-09-29 DIAGNOSIS — M5030 Other cervical disc degeneration, unspecified cervical region: Secondary | ICD-10-CM | POA: Diagnosis not present

## 2015-09-29 DIAGNOSIS — M47816 Spondylosis without myelopathy or radiculopathy, lumbar region: Secondary | ICD-10-CM

## 2015-09-29 DIAGNOSIS — Q761 Klippel-Feil syndrome: Secondary | ICD-10-CM

## 2015-09-29 DIAGNOSIS — M47812 Spondylosis without myelopathy or radiculopathy, cervical region: Secondary | ICD-10-CM

## 2015-09-29 MED ORDER — OXYCODONE-ACETAMINOPHEN 5-325 MG PO TABS: ORAL_TABLET | ORAL | Status: DC

## 2015-09-29 MED ORDER — MORPHINE SULFATE ER 15 MG PO TBCR: EXTENDED_RELEASE_TABLET | ORAL | Status: DC

## 2015-09-29 NOTE — Patient Instructions (Signed)
PLAN   Continue present medication Percocet and MS Contin  F/U PCP Dr. Doy Hutching for evaliation of  BP and general medical  condition  F/U surgical evaluation. May consider pending follow-up evaluations  F/U neurological evaluation with Dr. Manuella Ghazi as scheduled  F/U vascular evaluation  May consider radiofrequency rhizolysis or intraspinal procedures pending response to present treatment and F/U evaluation . May consider repeating radiofrequency as discussed  Patient to call Pain Management Center should patient have concerns prior to scheduled return appointment.

## 2015-09-29 NOTE — Progress Notes (Signed)
   Subjective:    Patient ID: Sabrina Stanley, female    DOB: [birth year], 55 y.o.   MRN: [id number]  HPI  The patient is a 55 year old female who returns to pain management Center for further evaluation and treatment of pain involving the neck entire back upper and lower extremity regions as well as the lumbar lower extremity region. The patient admits to improvement of lower back lower extremity pain following radiofrequency rhizolysis lumbar facet, medial branch nerve, blocks. At the present time we will continue patient's MS Contin and oxycodone acetaminophen and we'll remain available to consider patient for additional interventional treatment as well as additional modifications of treatment regimen should there be returned significant pain. The patient was in agreement suggested treatment plan., Change in events of daily living call significant change in symptomatology  Review of Systems     Objective:   Physical Exam  Palpation over the cervical facet cervical paraspinal must reason was attends to palpation of mild degree with well-healed surgical scar of the cervical region without increase of warmth or erythema in the region scar. Patient appeared to be with slightly decreased grip strength and Tinel and Phalen's maneuver were without increase of pain of significant degree. There was tends to palpation of the splenius capitis and occipitalis musculature region of mild degree. Palpation of the thoracic facet thoracic paraspinal must reason was attends to palpation of moderate degree with no crepitus of the thoracic region noted. Palpation over the lumbar paraspinal must reason lumbar facet region was attends to palpation of mild to moderate degree with lateral bending rotation extension and palpation of the lumbar facets reproducing mild to moderate discomfort. There was tennis over the PSIS and PII S region a mild to moderate degree. There was mild tinnitus of the greater trochanteric region  iliotibial band region. Straight leg raise was tolerates approximately 20 without increased pain with dorsiflexion noted. There was negative clonus negative Homans. There was no sensory deficit or dermatomal dystrophy detected. DTRs difficult to this patient had difficulty relaxing. Abdomen nontender with no costovertebral angle tenderness noted.      Assessment & Plan:    Degenerative disc disease lumbar spine Degenerative changes L2-3 L4, L4-5, and L5-S1 most significantly involved  Lumbar facet syndrome  Sacroiliac joint dysfunction  Degenerative disc disease cervical spine Status post cervical fusion  Carpal tunnel syndrome versus cervical radiculopathy versus ulnar neuropathy  Bilateral occipital neuralgia     PLAN   Continue present medication Percocet and MS Contin  F/U PCP Dr. [name] for evaliation of  BP and general medical  condition  F/U surgical evaluation. May consider pending follow-up evaluations  F/U neurological evaluation with Dr. [name] as scheduled  F/U vascular evaluation  May consider radiofrequency rhizolysis or intraspinal procedures pending response to present treatment and F/U evaluation . May consider repeating radiofrequency as discussed  Patient to call Pain Management Center should patient have concerns prior to scheduled return appointment.

## 2015-09-29 NOTE — Progress Notes (Signed)
Safety precautions to be maintained throughout the outpatient stay will include: orient to surroundings, keep bed in low position, maintain call bell within reach at all times, provide assistance with transfer out of bed and ambulation.  

## 2015-10-12 ENCOUNTER — Other Ambulatory Visit: Payer: Self-pay | Admitting: Gastroenterology

## 2015-10-12 DIAGNOSIS — R1310 Dysphagia, unspecified: Secondary | ICD-10-CM

## 2015-10-13 ENCOUNTER — Other Ambulatory Visit: Payer: Self-pay | Admitting: Internal Medicine

## 2015-10-13 DIAGNOSIS — Z1231 Encounter for screening mammogram for malignant neoplasm of breast: Secondary | ICD-10-CM

## 2015-10-19 ENCOUNTER — Ambulatory Visit: Payer: Medicare Other

## 2015-10-20 ENCOUNTER — Ambulatory Visit: Payer: Medicare Other

## 2015-10-22 ENCOUNTER — Ambulatory Visit
Admission: RE | Admit: 2015-10-22 | Discharge: 2015-10-22 | Disposition: A | Discharge status: Home / Self Care | Payer: Medicare Other | Source: Ambulatory Visit | Attending: Gastroenterology | Admitting: Gastroenterology

## 2015-10-22 DIAGNOSIS — R1310 Dysphagia, unspecified: Secondary | ICD-10-CM | POA: Insufficient documentation

## 2015-10-22 NOTE — Therapy (Signed)
[place], [place], [address] Phone: [phone]   Fax:     Modified Barium Swallow  Patient Details  Name: Sabrina Stanley MRN: [id number] Date of Birth: 02-Dec-1960 No Data Recorded  Encounter Date: 10/22/2015      End of Session - 10/22/15 1329    Visit Number 1   Number of Visits 1   Date for SLP Re-Evaluation 10/22/15   SLP Start Time 1229   SLP Stop Time  1320   SLP Time Calculation (min) 51 min   Activity Tolerance Patient tolerated treatment well      Past Medical History  Diagnosis Date  . Abnormal mammogram, unspecified   . UTI (urinary tract infection)   . Depression   . Anxiety   . Bipolar 1 disorder ([place])   . Arthritis   . Hyperlipidemia   . GERD (gastroesophageal reflux disease)   . Osteopenia   . COPD (chronic obstructive pulmonary disease) ([place])   . Alcoholism ([place])   . Allergic state   . Anemia   . Bilateral breast cysts   . Bipolar 1 disorder ([place])   . Bone marrow disorder   . Bronchitis   . Cervical stenosis of spine   . Cervicalgia   . Chronic diarrhea   . Chronic fatigue disorder   . Chronic pain   . Depression   . Eczema   . Esophageal reflux   . Hypertension   . Fibromyalgia   . Hiatal hernia   . Hypothyroidism   . Insomnia   . Lithium toxicity   . Menstrual irregularity   . Headache   . Myalgia   . Osteopenia   . Hyperlipidemia   . Panic disorder   . Pleurisy   . Pneumonia   . Psoriasis   . PVD (peripheral vascular disease) ([place])   . Sleep apnea   . Tobacco abuse   . Vitamin D deficiency   . History of esophagogastroduodenoscopy (EGD)     Past Surgical History  Procedure Laterality Date  . Spine surgery  2006, 2008    cervical fusion  . Abdominal hysterectomy  1996  . Eye surgery  2010  . Dilation and curettage of uterus  1979, 1980  . Nasal sinus surgery  1998, 2000  . Knee surgery Right 1998    meniscus repair  . Breast biopsy  Right 08-21-12  . Neck surgery      x 2  . Oophorectomy    . Tonsillectomy    . Colonoscopy    . Esophagogastroduodenoscopy    . Esophagogastroduodenoscopy N/A 09/17/2015    Procedure: ESOPHAGOGASTRODUODENOSCOPY (EGD);  Surgeon: Hulen Luster, MD;  Location: [facility] ENDOSCOPY;  Service: Gastroenterology;  Laterality: N/A;    There were no vitals filed for this visit.  Visit Diagnosis: Dysphagia - Plan: DG OP Swallowing Func-Medicare/Speech Path, DG OP Swallowing Func-Medicare/Speech Path   Subjective: Patient behavior: (alertness, ability to follow instructions, etc.): Patient is able to express her swallowing history and follow directions.  Chief complaint: Pills (and sometimes food) get "stuck in my throat".  Patient reports that she coughs vigorously until she regurgitates the pill and is then able to re-swallow and have the pill "go on down"   Objective:  Radiological Procedure: A videoflouroscopic evaluation of oral-preparatory, reflex initiation, and pharyngeal phases of the swallow was performed; as well as a screening of the upper esophageal phase.  I. POSTURE: Upright in MBS chair  II.  VIEW: Lateral  III. COMPENSATORY STRATEGIES: Liquid swallow help move pill but does not completely clear immediately  IV. BOLUSES ADMINISTERED:   Thin Liquid: 2 small sips, 3 rapid, consecutive sips   Nectar-thick Liquid: 1 moderate bolus    Puree: 2 1/2 teaspoon   Mechanical Soft: 1/4 graham cracker in applesauce    Barium tablet  V. RESULTS OF EVALUATION: A. ORAL PREPARATORY PHASE: (The lips, tongue, and velum are observed for strength and coordination)       **Overall Severity Rating: within normal limits  B. SWALLOW INITIATION/REFLEX: (The reflex is normal if "triggered" by the time the bolus reached the base of the tongue)  **Overall Severity Rating: within normal limits  C. PHARYNGEAL PHASE: (Pharyngeal function is normal if the bolus shows rapid, smooth, and continuous transit  through the pharynx and there is no pharyngeal residue after the swallow)  **Overall Severity Rating: within normal limits  D. LARYNGEAL PENETRATION: (Material entering into the laryngeal inlet/vestibule but not aspirated) None  E. ASPIRATION: None  F. ESOPHAGEAL PHASE: (Screening of the upper esophagus) Barium tablet lodges in cervical esophagus, liquid sips promote movement but not full clearance  ASSESSMENT: 55 year old woman, with globus sensation and known narrowing of cervical esophagus per barium swallow 09/10/2015 (barium tablet lodged in cervical esophagus), is presenting with functional oropharyngeal swallowing.  Oral control of the bolus including oral hold, rotary mastication, and anterior to posterior transfer is within normal limits. Timing of the pharyngeal swallow is within normal limits.  Pharyngeal aspects of the swallow including hyolaryngeal excursion, pharyngeal pressure generation, epiglottic inversion, duration/amplitude of UES opening, and laryngeal vestibule closure at the height of the swallow are within functional limits.  There is coating of the oropharynx after the swallow consistent with adhering to dry surfaces due to xerostomia.  There is no observed laryngeal penetration or tracheal aspiration.  A 12.5 mm barium tablet moved through the pharynx and UES, lodged in the cervical esophagus.  Liquid swallows did promote movement, but not complete clearance into the stomach.  The patient was advised to crush meds as able.   She was assured that when she feels that a pill or food item is "stuck" in her throat, it is actually well into the esophagus and she is not in danger of aspirating.  She was advised to take fluids and be patient to allow the item to empty into the stomach.  PLAN/RECOMMENDATIONS:   A. Diet: Regular, moisten foods as needed   B. Swallowing Precautions: small bites, chew well, take plenty of fluids with meals, crush meds as able   C. Recommended  consultation to   D. Therapy recommendations N/A   E. Results and recommendations were discussed with the patient immediately following the study and the final report to be routed to referring practitioner.            G-Codes - Nov 21, 2015 1330    Functional Assessment Tool Used MBS, clinical judgment   Functional Limitations Swallowing   Swallow Current Status [id number]) At least 1 percent but less than 20 percent impaired, limited or restricted   Swallow Goal Status [id number]) At least 1 percent but less than 20 percent impaired, limited or restricted   Swallow Discharge Status [id number]) At least 1 percent but less than 20 percent impaired, limited or restricted          Problem List Patient Active Problem List   Diagnosis Date Noted  . Ulnar neuropathy 05/14/2015  . DDD (degenerative disc disease), lumbar  02/11/2015  . Facet syndrome, lumbar 02/11/2015  . Status post cervical spinal fusion 02/11/2015  . Sacroiliac joint dysfunction 02/11/2015   Leroy Sea, MS/CCC- SLP  Lou Miner 10/22/2015, 1:31 PM  [facility] [place], [place], [address] Phone: [phone]   Fax:     Name: Sabrina Stanley MRN: [id number] Date of Birth: 1960-09-03

## 2015-10-27 ENCOUNTER — Encounter: Payer: Self-pay | Admitting: Pain Medicine

## 2015-10-27 ENCOUNTER — Ambulatory Visit: Payer: Medicare Other | Attending: Pain Medicine | Admitting: Pain Medicine

## 2015-10-27 VITALS — BP 105/74 | HR 86 | Temp 98.4°F | Resp 18 | Ht 62.5 in | Wt 170.0 lb

## 2015-10-27 DIAGNOSIS — M533 Sacrococcygeal disorders, not elsewhere classified: Secondary | ICD-10-CM

## 2015-10-27 DIAGNOSIS — M5030 Other cervical disc degeneration, unspecified cervical region: Secondary | ICD-10-CM | POA: Diagnosis not present

## 2015-10-27 DIAGNOSIS — M51369 Other intervertebral disc degeneration, lumbar region without mention of lumbar back pain or lower extremity pain: Secondary | ICD-10-CM

## 2015-10-27 DIAGNOSIS — Z981 Arthrodesis status: Secondary | ICD-10-CM

## 2015-10-27 DIAGNOSIS — M47816 Spondylosis without myelopathy or radiculopathy, lumbar region: Secondary | ICD-10-CM

## 2015-10-27 DIAGNOSIS — M542 Cervicalgia: Secondary | ICD-10-CM | POA: Insufficient documentation

## 2015-10-27 DIAGNOSIS — F17200 Nicotine dependence, unspecified, uncomplicated: Secondary | ICD-10-CM | POA: Insufficient documentation

## 2015-10-27 DIAGNOSIS — M546 Pain in thoracic spine: Secondary | ICD-10-CM | POA: Diagnosis present

## 2015-10-27 DIAGNOSIS — M461 Sacroiliitis, not elsewhere classified: Secondary | ICD-10-CM

## 2015-10-27 DIAGNOSIS — M5481 Occipital neuralgia: Secondary | ICD-10-CM | POA: Insufficient documentation

## 2015-10-27 DIAGNOSIS — Q761 Klippel-Feil syndrome: Secondary | ICD-10-CM

## 2015-10-27 DIAGNOSIS — M5126 Other intervertebral disc displacement, lumbar region: Secondary | ICD-10-CM | POA: Diagnosis not present

## 2015-10-27 DIAGNOSIS — M5136 Other intervertebral disc degeneration, lumbar region: Secondary | ICD-10-CM | POA: Insufficient documentation

## 2015-10-27 DIAGNOSIS — M79606 Pain in leg, unspecified: Secondary | ICD-10-CM | POA: Diagnosis present

## 2015-10-27 DIAGNOSIS — M47812 Spondylosis without myelopathy or radiculopathy, cervical region: Secondary | ICD-10-CM

## 2015-10-27 DIAGNOSIS — G5620 Lesion of ulnar nerve, unspecified upper limb: Secondary | ICD-10-CM

## 2015-10-27 MED ORDER — MORPHINE SULFATE ER 15 MG PO TBCR: EXTENDED_RELEASE_TABLET | ORAL | Status: DC

## 2015-10-27 MED ORDER — OXYCODONE-ACETAMINOPHEN 5-325 MG PO TABS: ORAL_TABLET | ORAL | Status: DC

## 2015-10-27 NOTE — Progress Notes (Signed)
   Subjective:    Patient ID: Sabrina Stanley, female    DOB: 15-Aug-1961, 55 y.o.   MRN: [id number]  HPI The patient is a 55 year old female who returns to pain management for further evaluation and treatment of pain involving the neck upper extremity region entire back and lower extremity regions. The patient states the pain is fairly well-controlled at this time about significant change in condition. Patient denies any trauma or other incidents which may have caused the change in her symptoms. The patient states that she has decreased swelling of the lower extremities at this time. The patient continues to smoke. We've caution patient regarding smoking and recommend patient decrease smoking in attempt to discontinue. We discussed patient's general medical condition as well as the effect of smoking spine. We will continue medications MS Contin and oxycodone acetaminophen at this time. The patient was in agreement with suggested treatment plan   Review of Systems     Objective:   Physical Exam  Palpation of the splenius capitis and a separate talus regions reproduced pain of mild-to-moderate degree. There was limited range of motion of the cervical spine with well-healed scar of the cervical region without increased warmth and erythema in the region of the scar. The acromial clavicular and glenohumeral joint regions reproduced mild to minimal discomfort. The patient was with slightly decreased grip strength and Tinel and Phalen's maneuver were without increased pain of significant degree. So the cervical facet cervical paraspinal musculature region as well as the thoracic facet thoracic paraspinal musculature region with no crepitus of the thoracic region noted. Palpation over the lumbar paraspinal musculature region lumbar facet region was attends to palpation of moderate degree with lateral bending rotation extension and palpation of the lumbar facets reproduced moderate discomfort. Palpation over the  PSIS and PII S region reproduced pain of moderate degree with mild tenderness of the greater trochanteric region iliotibial band region. There was 1+ lower extremity edema with tenderness of the knees and crepitus of the knees noted with negative anterior and posterior drawer signs without ballottement of the patella. Straight leg raising was limited to 20 without a definite increased pain with dorsiflexion noted. DTRs were difficult to elicit patient had difficulty relaxing no sensory deficit or dermatomal distribution detected. There was negative clonus negative Homans. Abdomen was nontender with no costovertebral tenderness noted      Assessment & Plan:     Degenerative disc disease lumbar spine Degenerative changes L2-3 L4, L4-5, and L5-S1 most significantly involved  Lumbar facet syndrome  Sacroiliac joint dysfunction  Degenerative disc disease cervical spine Status post cervical fusion  Carpal tunnel syndrome versus cervical radiculopathy versus ulnar neuropathy  Bilateral occipital neuralgia    PLAN   Continue present medication Percocet and MS Contin  F/U PCP Dr. Doy Hutching for evaliation of  BP and general medical  condition  F/U surgical evaluation. May consider pending follow-up evaluations  F/U neurological evaluation with Dr. Manuella Ghazi as scheduled  F/U vascular evaluation  May consider radiofrequency rhizolysis or intraspinal procedures pending response to present treatment and F/U evaluation . May consider repeating radiofrequency as discussed  Patient to call [facility] Management Center should patient have concerns prior to scheduled return appointment.

## 2015-10-27 NOTE — Patient Instructions (Signed)
PLAN   Continue present medication Percocet and MS Contin  F/U PCP Dr. Doy Hutching for evaliation of  BP and general medical  condition  F/U surgical evaluation. May consider pending follow-up evaluations  F/U neurological evaluation with Dr. Manuella Ghazi as scheduled  F/U vascular evaluation  May consider radiofrequency rhizolysis or intraspinal procedures pending response to present treatment and F/U evaluation . May consider repeating radiofrequency as discussed  Patient to call Pain Management Center should patient have concerns prior to scheduled return appointment.

## 2015-10-27 NOTE — Progress Notes (Signed)
Safety precautions to be maintained throughout the outpatient stay will include: orient to surroundings, keep bed in low position, maintain call bell within reach at all times, provide assistance with transfer out of bed and ambulation.  

## 2015-11-10 ENCOUNTER — Ambulatory Visit
Admission: RE | Admit: 2015-11-10 | Discharge: 2015-11-10 | Disposition: A | Discharge status: Home / Self Care | Payer: Medicare Other | Source: Ambulatory Visit | Attending: Internal Medicine | Admitting: Internal Medicine

## 2015-11-10 ENCOUNTER — Other Ambulatory Visit: Payer: Self-pay | Admitting: Internal Medicine

## 2015-11-10 DIAGNOSIS — Z1231 Encounter for screening mammogram for malignant neoplasm of breast: Secondary | ICD-10-CM | POA: Insufficient documentation

## 2015-11-24 ENCOUNTER — Encounter: Payer: Self-pay | Admitting: Pain Medicine

## 2015-11-24 ENCOUNTER — Ambulatory Visit: Payer: Medicare Other | Attending: Pain Medicine | Admitting: Pain Medicine

## 2015-11-24 VITALS — BP 101/64 | HR 88 | Temp 98.2°F | Resp 16 | Ht 62.0 in | Wt 180.0 lb

## 2015-11-24 DIAGNOSIS — M545 Low back pain: Secondary | ICD-10-CM | POA: Diagnosis present

## 2015-11-24 DIAGNOSIS — M5030 Other cervical disc degeneration, unspecified cervical region: Secondary | ICD-10-CM | POA: Diagnosis not present

## 2015-11-24 DIAGNOSIS — M5481 Occipital neuralgia: Secondary | ICD-10-CM | POA: Diagnosis not present

## 2015-11-24 DIAGNOSIS — M542 Cervicalgia: Secondary | ICD-10-CM | POA: Diagnosis present

## 2015-11-24 DIAGNOSIS — M5136 Other intervertebral disc degeneration, lumbar region: Secondary | ICD-10-CM

## 2015-11-24 DIAGNOSIS — Z981 Arthrodesis status: Secondary | ICD-10-CM | POA: Diagnosis not present

## 2015-11-24 DIAGNOSIS — M47812 Spondylosis without myelopathy or radiculopathy, cervical region: Secondary | ICD-10-CM

## 2015-11-24 DIAGNOSIS — M7989 Other specified soft tissue disorders: Secondary | ICD-10-CM | POA: Diagnosis not present

## 2015-11-24 DIAGNOSIS — M47816 Spondylosis without myelopathy or radiculopathy, lumbar region: Secondary | ICD-10-CM | POA: Diagnosis not present

## 2015-11-24 DIAGNOSIS — M533 Sacrococcygeal disorders, not elsewhere classified: Secondary | ICD-10-CM

## 2015-11-24 DIAGNOSIS — Q761 Klippel-Feil syndrome: Secondary | ICD-10-CM

## 2015-11-24 DIAGNOSIS — G5620 Lesion of ulnar nerve, unspecified upper limb: Secondary | ICD-10-CM

## 2015-11-24 DIAGNOSIS — M461 Sacroiliitis, not elsewhere classified: Secondary | ICD-10-CM

## 2015-11-24 MED ORDER — OXYCODONE-ACETAMINOPHEN 5-325 MG PO TABS: ORAL_TABLET | ORAL | Status: DC

## 2015-11-24 MED ORDER — MORPHINE SULFATE ER 15 MG PO TBCR: EXTENDED_RELEASE_TABLET | ORAL | Status: DC

## 2015-11-24 NOTE — Progress Notes (Signed)
   Subjective:    Patient ID: Sabrina Stanley, female    DOB: 1961-02-29, 55 y.o.   MRN: [id number]  HPI  The patient is a 55 year old female who returns to pain management for further evaluation and treatment of pain involving the neck upper extremity regions especially the right upper extremity lower back and lower extremity regions. The patient has had improvement of her lower back lower extremity pain following radiofrequency rhizolysis lumbar facet, medial branch nerve radiofrequency. At the present time we will continue patient's MS Contin and oxycodone acetaminophen. We will avoid interventional treatment. The patient continues to use pneumatic compression device for lower extremity swelling which has improved. We discussed patient's condition and will continue presently prescribed medications. The patient denies any trauma change in events of daily living the call significant change in symptomatology. Suggested treatment plan     Review of Systems     Objective:   Physical Exam  There was tenderness of the splenius capitis and occipitalis musculature region a mild degree with well-healed surgical scar of the cervical region without increased warmth and erythema in the region of the scar. There was limited range of motion of the cervical spine with unremarkable Spurling's maneuver the patient appeared to be with slightly decreased grip strength. There was mild to moderate tenderness of the epicondyles of the elbow both medial and lateral epicondyles. Tinel and Phalen's maneuver were without increase of pain of significant degree. There was tenderness of the thoracic facet thoracic paraspinal musculature region with no crepitus of the thoracic region noted. Palpation over the lumbar paraspinal must reason lumbar facet region was with moderate tends to palpation. Lateral bending and extension and palpation of the lumbar facets reproduce moderate discomfort. Straight leg raising was tolerates  approximately 20 without a definite increase of pain with dorsiflexion noted. There was tenderness over the PSIS and PII S region as well as the gluteal and piriformis musculature regions. There was mild tenderness of the greater trochanteric region and iliotibial band region. There was 1+ lower extremity edema with negative clonus negative Homans. EHL strength appeared to be slightly decreased. No definite sensory deficit or dermatomal distribution detected. There was no abdominal tends to palpation and no costovertebral tenderness noted      Assessment & Plan:     Degenerative disc disease lumbar spine Degenerative changes L2-3 L4, L4-5, and L5-S1 most significantly involved  Lumbar facet syndrome  Sacroiliac joint dysfunction  Degenerative disc disease cervical spine Status post cervical fusion  Carpal tunnel syndrome versus cervical radiculopathy versus ulnar neuropathy  Bilateral occipital neuralgia     PLAN   Continue present medication Percocet and MS Contin  F/U PCP Dr. Doy Hutching for evaliation of  BP and general medical  condition  F/U surgical evaluation. May consider pending follow-up evaluations  F/U neurological evaluation with Dr. Manuella Ghazi   F/U vascular evaluation   May consider radiofrequency rhizolysis or intraspinal procedures pending response to present treatment and F/U evaluation . May consider repeating radiofrequency as discussed  Patient to call Pain Management Center should patient have concerns prior to scheduled return appointment.

## 2015-11-24 NOTE — Progress Notes (Signed)
Safety precautions to be maintained throughout the outpatient stay will include: orient to surroundings, keep bed in low position, maintain call bell within reach at all times, provide assistance with transfer out of bed and ambulation.  

## 2015-11-24 NOTE — Patient Instructions (Signed)
PLAN   Continue present medication Percocet and MS Contin  F/U PCP Dr. Doy Hutching for evaliation of  BP and general medical  condition  F/U surgical evaluation. May consider pending follow-up evaluations  F/U neurological evaluation with Dr. Manuella Ghazi   F/U vascular evaluation   May consider radiofrequency rhizolysis or intraspinal procedures pending response to present treatment and F/U evaluation . May consider repeating radiofrequency as discussed  Patient to call Pain Management Center should patient have concerns prior to scheduled return appointment.

## 2015-12-24 ENCOUNTER — Ambulatory Visit: Payer: Medicare Other | Attending: Pain Medicine | Admitting: Pain Medicine

## 2015-12-24 ENCOUNTER — Encounter: Payer: Self-pay | Admitting: Pain Medicine

## 2015-12-24 VITALS — BP 119/62 | HR 85 | Temp 98.2°F | Resp 18 | Ht 62.0 in | Wt 178.0 lb

## 2015-12-24 DIAGNOSIS — M47816 Spondylosis without myelopathy or radiculopathy, lumbar region: Secondary | ICD-10-CM

## 2015-12-24 DIAGNOSIS — M461 Sacroiliitis, not elsewhere classified: Secondary | ICD-10-CM

## 2015-12-24 DIAGNOSIS — M5481 Occipital neuralgia: Secondary | ICD-10-CM | POA: Diagnosis not present

## 2015-12-24 DIAGNOSIS — M5136 Other intervertebral disc degeneration, lumbar region: Secondary | ICD-10-CM | POA: Insufficient documentation

## 2015-12-24 DIAGNOSIS — M542 Cervicalgia: Secondary | ICD-10-CM | POA: Diagnosis present

## 2015-12-24 DIAGNOSIS — M546 Pain in thoracic spine: Secondary | ICD-10-CM | POA: Diagnosis present

## 2015-12-24 DIAGNOSIS — M79606 Pain in leg, unspecified: Secondary | ICD-10-CM | POA: Diagnosis present

## 2015-12-24 DIAGNOSIS — Z981 Arthrodesis status: Secondary | ICD-10-CM

## 2015-12-24 DIAGNOSIS — M51369 Other intervertebral disc degeneration, lumbar region without mention of lumbar back pain or lower extremity pain: Secondary | ICD-10-CM

## 2015-12-24 DIAGNOSIS — M5030 Other cervical disc degeneration, unspecified cervical region: Secondary | ICD-10-CM | POA: Diagnosis not present

## 2015-12-24 DIAGNOSIS — M7989 Other specified soft tissue disorders: Secondary | ICD-10-CM | POA: Diagnosis not present

## 2015-12-24 DIAGNOSIS — M47812 Spondylosis without myelopathy or radiculopathy, cervical region: Secondary | ICD-10-CM

## 2015-12-24 DIAGNOSIS — M533 Sacrococcygeal disorders, not elsewhere classified: Secondary | ICD-10-CM

## 2015-12-24 DIAGNOSIS — Q761 Klippel-Feil syndrome: Secondary | ICD-10-CM

## 2015-12-24 DIAGNOSIS — G5620 Lesion of ulnar nerve, unspecified upper limb: Secondary | ICD-10-CM

## 2015-12-24 MED ORDER — MORPHINE SULFATE ER 15 MG PO TBCR
EXTENDED_RELEASE_TABLET | ORAL | Status: DC
Start: 2015-12-24 — End: 2016-01-26

## 2015-12-24 MED ORDER — OXYCODONE-ACETAMINOPHEN 5-325 MG PO TABS
ORAL_TABLET | ORAL | Status: DC
Start: 2015-12-24 — End: 2016-01-26

## 2015-12-24 NOTE — Progress Notes (Signed)
   Subjective:    Patient ID: Sabrina Stanley, female    DOB: 09/10/1960, 55 y.o.   MRN: [id number]  HPI  The patient is a 55 year old female who returns to pain management for further evaluation and treatment of pain involving the region of the neck entire back upper and lower extremity regions.. At the present time patient states the pain appears to be fairly well-controlled. The patient has decreased swelling of the lower extremities. The patient is tolerating MS Contin with oxycodone acetaminophen for breakthrough pain very well. The patient has been quite busy with her husband who has been recently hospitalized. We discussed patient's condition at the present time we will continue to present treatment regimen. Patient denies any severe exacerbations of pain involving the lower back lower extremity region or neck and upper extremity region. We will consider modification of treatment regimen should they be change in condition. We will continue MS Contin with oxycodone acetaminophen for breakthrough pain at this time. Treatment plan  Review of Systems     Objective:   Physical Exam   Palpation of the splenius capitis and occipitalis musculature region reproduced pain of mild degree and there was well-healed surgical scar of the cervical region without increased warmth and erythema in the region of the scar palpation over the region of the cervical facet cervical paraspinal musculature region as well as the thoracic facet thoracic paraspinal must reason associated with mild to moderate discomfort. The patient appeared to be with unremarkable Spurling's maneuver and was with slightly decreased grip strength. Tinel and Phalen's maneuver were without increase of pain of significant degree. The patient was a tenderness over the thoracic facet thoracic paraspinal muscles regions of mild to moderate degree with muscle spasms noted in the mid and lower thoracic region of moderate degree. Palpation over the  lumbar paraspinal must reason lumbar facet region was tends to palpation of moderately severe degree with extension palpation over the lumbar facets reproducing severe pain and was severe pain with lateral bending and rotation. There was tenderness over the PSIS and PII S region a moderate degree. And moderate tenderness of the greater trochanteric region iliotibial band region. DTRs were difficult to elicit. Patient had difficulty relaxing. There was stasis dermatitis changes of the lower extremities noted with 1+ lower extremity edema with negative clonus negative Homans and without sensory deficit or dermatomal distribution detected. EHL strength appeared to be decreased. Abdomen nontender with no costovertebral tenderness noted     Assessment & Plan:    Degenerative disc disease lumbar spine Degenerative changes L2-3 L4, L4-5, and L5-S1 most significantly involved  Lumbar facet syndrome  Sacroiliac joint dysfunction  Degenerative disc disease cervical spine Status post cervical fusion  Carpal tunnel syndrome versus cervical radiculopathy versus ulnar neuropathy  Bilateral occipital neuralgia      PLAN   Continue present medication Percocet and MS Contin  F/U PCP Dr. Doy Hutching for evaliation of  BP and general medical  condition  F/U surgical evaluation. May consider pending follow-up evaluations  F/U neurological evaluation with Dr. Manuella Ghazi as discussed  F/U vascular evaluation as discussed  May consider radiofrequency rhizolysis or intraspinal procedures pending response to present treatment and F/U evaluation . May consider repeating radiofrequency as discussed  Patient to call Pain Management Center should patient have concerns prior to scheduled return appointment.

## 2015-12-24 NOTE — Progress Notes (Signed)
Safety precautions to be maintained throughout the outpatient stay will include: orient to surroundings, keep bed in low position, maintain call bell within reach at all times, provide assistance with transfer out of bed and ambulation.  

## 2015-12-24 NOTE — Patient Instructions (Signed)
PLAN   Continue present medication Percocet and MS Contin  F/U PCP Dr. Doy Hutching for evaliation of  BP and general medical  condition  F/U surgical evaluation. May consider pending follow-up evaluations  F/U neurological evaluation with Dr. Manuella Ghazi as discussed  F/U vascular evaluation as discussed  May consider radiofrequency rhizolysis or intraspinal procedures pending response to present treatment and F/U evaluation . May consider repeating radiofrequency as discussed  Patient to call Pain Management Center should patient have concerns prior to scheduled return appointment.

## 2015-12-30 LAB — TOXASSURE SELECT 13 (MW), URINE

## 2016-01-04 NOTE — Progress Notes (Signed)
Quick Note:  Reviewed. ______ 

## 2016-01-26 ENCOUNTER — Ambulatory Visit: Payer: Medicare Other | Attending: Pain Medicine | Admitting: Pain Medicine

## 2016-01-26 ENCOUNTER — Encounter: Payer: Self-pay | Admitting: Pain Medicine

## 2016-01-26 VITALS — BP 130/77 | HR 85 | Temp 97.8°F | Resp 16 | Ht 62.5 in | Wt 174.0 lb

## 2016-01-26 DIAGNOSIS — I739 Peripheral vascular disease, unspecified: Secondary | ICD-10-CM | POA: Insufficient documentation

## 2016-01-26 DIAGNOSIS — Z981 Arthrodesis status: Secondary | ICD-10-CM | POA: Diagnosis not present

## 2016-01-26 DIAGNOSIS — M5481 Occipital neuralgia: Secondary | ICD-10-CM | POA: Insufficient documentation

## 2016-01-26 DIAGNOSIS — Q761 Klippel-Feil syndrome: Secondary | ICD-10-CM

## 2016-01-26 DIAGNOSIS — M79604 Pain in right leg: Secondary | ICD-10-CM | POA: Diagnosis present

## 2016-01-26 DIAGNOSIS — M5136 Other intervertebral disc degeneration, lumbar region: Secondary | ICD-10-CM | POA: Diagnosis not present

## 2016-01-26 DIAGNOSIS — M533 Sacrococcygeal disorders, not elsewhere classified: Secondary | ICD-10-CM | POA: Diagnosis not present

## 2016-01-26 DIAGNOSIS — G5620 Lesion of ulnar nerve, unspecified upper limb: Secondary | ICD-10-CM

## 2016-01-26 DIAGNOSIS — M461 Sacroiliitis, not elsewhere classified: Secondary | ICD-10-CM

## 2016-01-26 DIAGNOSIS — M47816 Spondylosis without myelopathy or radiculopathy, lumbar region: Secondary | ICD-10-CM | POA: Diagnosis not present

## 2016-01-26 DIAGNOSIS — M545 Low back pain: Secondary | ICD-10-CM | POA: Diagnosis present

## 2016-01-26 DIAGNOSIS — M5030 Other cervical disc degeneration, unspecified cervical region: Secondary | ICD-10-CM | POA: Diagnosis not present

## 2016-01-26 DIAGNOSIS — R202 Paresthesia of skin: Secondary | ICD-10-CM | POA: Diagnosis not present

## 2016-01-26 DIAGNOSIS — M79605 Pain in left leg: Secondary | ICD-10-CM | POA: Insufficient documentation

## 2016-01-26 DIAGNOSIS — M47812 Spondylosis without myelopathy or radiculopathy, cervical region: Secondary | ICD-10-CM

## 2016-01-26 MED ORDER — MORPHINE SULFATE ER 15 MG PO TBCR: EXTENDED_RELEASE_TABLET | ORAL | Status: DC

## 2016-01-26 MED ORDER — OXYCODONE-ACETAMINOPHEN 5-325 MG PO TABS: ORAL_TABLET | ORAL | Status: DC

## 2016-01-26 NOTE — Progress Notes (Signed)
   Subjective:    Patient ID: Sabrina Stanley, female    DOB: 1960/12/22, 55 y.o.   MRN: [id number]  HPI  The patient is a 55 year old female who returns to pain management for further evaluation and treatment of pain involving the lower back and lower extremity regions predominantly. The patient also has a history of pain involving the neck and upper extremity regions. The patient states that her lower extremities are aching and throbbing and that the pain of the lower extremities is quite severe and it appears all activities of daily living as well as ability to obtain restful sleep.. The patient is undergone vascular evaluation and is without plans for vascular intervention of the lower extremities and is without plans for lumbar surgery to treat and lumbar and lower extremity pain and paresthesias. On today's visit we discussed patient's condition and following evaluation of patient decision has been made to proceed with lumbar sympathetic block at time of return appointment in attempt to decrease severity of symptoms, minimize progression of symptoms, and avoid the need for more involved treatment. The patient was noted to be with some lower extremity swelling and with palpation with stasis dermatitis changes of the lower extremities noted as well. We will proceed with lumbar sympathetic block at time return appointment and patient will continue MS Contin and oxycodone acetaminophen. All agreed to suggested treatment plan     Review of Systems     Objective:   Physical Exam  There was mild tenderness of the splenius capitis and occipitalis musculature region as well as the cervical facet cervical paraspinal musculature region with limited range of motion of the cervical spine and with unremarkable Spurling's maneuver. The patient appeared to be with slightly decreased grip strength and Tinel and Phalen's maneuver were without increased pain of significant degree. There was tenderness of the  thoracic region thoracic facet region without crepitus of the thoracic region noted. There was tenderness over the lumbar paraspinal musculature region lumbar facet region of moderate degree with lateral bending rotation extension and palpation of the lumbar facets reproducing moderate discomfort. There was moderate tenderness of the PSIS and PII S region. The patient appeared to be with straight leg raising tolerates approximately 30 without a definite increase of pain with dorsiflexion noted. EHL strength appeared to be decreased. There was stasis dermatitis changes of the lower extremity noted with clonus negative Homans. No definite sensory deficit or dermatomal distribution detected. Abdomen nontender with no costovertebral tenderness noted      Assessment & Plan:   Peripheral vascular disease of lower extremities  Degenerative disc disease lumbar spine Degenerative changes L2-3 L4, L4-5, and L5-S1 most significantly involved  Lumbar facet syndrome  Sacroiliac joint dysfunction  Degenerative disc disease cervical spine Status post cervical fusion  Carpal tunnel syndrome versus cervical radiculopathy versus ulnar neuropathy  Bilateral occipital neuralgia      PLAN   Continue present medication Percocet and MS Contin   Lumbar sympathetic block to be performed at time of return appointment  F/U PCP Dr. Doy Hutching for evaliation of  BP and general medical  condition  F/U surgical evaluation. May consider pending follow-up evaluations  F/U neurological evaluation with Dr. Manuella Ghazi  F/U vascular evaluation as discussed and as needed  May consider radiofrequency rhizolysis or intraspinal procedures pending response to present treatment and F/U evaluation . May consider repeating radiofrequency as discussed  Patient to call Pain Management Center should patient have concerns prior to scheduled return appointment.

## 2016-01-26 NOTE — Progress Notes (Signed)
Safety precautions to be maintained throughout the outpatient stay will include: orient to surroundings, keep bed in low position, maintain call bell within reach at all times, provide assistance with transfer out of bed and ambulation.  

## 2016-01-26 NOTE — Patient Instructions (Addendum)
PLAN   Continue present medication Percocet and MS Contin   Lumbar sympathetic block to be performed at time of return appointment  F/U PCP Dr. Doy Hutching for evaliation of  BP and general medical  condition  F/U surgical evaluation. May consider pending follow-up evaluations  F/U neurological evaluation with Dr. Manuella Ghazi  F/U vascular evaluation as discussed and as needed  May consider radiofrequency rhizolysis or intraspinal procedures pending response to present treatment and F/U evaluation . May consider repeating radiofrequency as discussed  Patient to call [facility] should patient have concerns prior to scheduled return appointment.GENERAL RISKS AND COMPLICATIONS  What are the risk, side effects and possible complications? Generally speaking, most procedures are safe.  However, with any procedure there are risks, side effects, and the possibility of complications.  The risks and complications are dependent upon the sites that are lesioned, or the type of nerve block to be performed.  The closer the procedure is to the spine, the more serious the risks are.  Great care is taken when placing the radio frequency needles, block needles or lesioning probes, but sometimes complications can occur. 1. Infection: Any time there is an injection through the skin, there is a risk of infection.  This is why sterile conditions are used for these blocks.  There are four possible types of infection. 1. Localized skin infection. 2. Central Nervous System Infection-This can be in the form of Meningitis, which can be deadly. 3. Epidural Infections-This can be in the form of an epidural abscess, which can cause pressure inside of the spine, causing compression of the spinal cord with subsequent paralysis. This would require an emergency surgery to decompress, and there are no guarantees that the patient would recover from the paralysis. 4. Discitis-This is an infection of the intervertebral discs.   It occurs in about 1% of discography procedures.  It is difficult to treat and it may lead to surgery.        2. Pain: the needles have to go through skin and soft tissues, will cause soreness.       3. Damage to internal structures:  The nerves to be lesioned may be near blood vessels or    other nerves which can be potentially damaged.       4. Bleeding: Bleeding is more common if the patient is taking blood thinners such as  aspirin, Coumadin, Ticiid, Plavix, etc., or if he/she have some genetic predisposition  such as hemophilia. Bleeding into the spinal canal can cause compression of the spinal  cord with subsequent paralysis.  This would require an emergency surgery to  decompress and there are no guarantees that the patient would recover from the  paralysis.       5. Pneumothorax:  Puncturing of a lung is a possibility, every time a needle is introduced in  the area of the chest or upper back.  Pneumothorax refers to free air around the  collapsed lung(s), inside of the thoracic cavity (chest cavity).  Another two possible  complications related to a similar event would include: Hemothorax and Chylothorax.   These are variations of the Pneumothorax, where instead of air around the collapsed  lung(s), you may have blood or chyle, respectively.       6. Spinal headaches: They may occur with any procedures in the area of the spine.       7. Persistent CSF (Cerebro-Spinal Fluid) leakage: This is a rare problem, but may occur  with prolonged intrathecal or  epidural catheters either due to the formation of a fistulous  track or a dural tear.       8. Nerve damage: By working so close to the spinal cord, there is always a possibility of  nerve damage, which could be as serious as a permanent spinal cord injury with  paralysis.       9. Death:  Although rare, severe deadly allergic reactions known as "Anaphylactic  reaction" can occur to any of the medications used.      10. Worsening of the symptoms:  We  can always make thing worse.  What are the chances of something like this happening? Chances of any of this occuring are extremely low.  By statistics, you have more of a chance of getting killed in a motor vehicle accident: while driving to the hospital than any of the above occurring .  Nevertheless, you should be aware that they are possibilities.  In general, it is similar to taking a shower.  Everybody knows that you can slip, hit your head and get killed.  Does that mean that you should not shower again?  Nevertheless always keep in mind that statistics do not mean anything if you happen to be on the wrong side of them.  Even if a procedure has a 1 (one) in a 1,000,000 (million) chance of going wrong, it you happen to be that one..Also, keep in mind that by statistics, you have more of a chance of having something go wrong when taking medications.  Who should not have this procedure? If you are on a blood thinning medication (e.g. Coumadin, Plavix, see list of "Blood Thinners"), or if you have an active infection going on, you should not have the procedure.  If you are taking any blood thinners, please inform your physician.  How should I prepare for this procedure?  Do not eat or drink anything at least six hours prior to the procedure.  Bring a driver with you .  It cannot be a taxi.  Come accompanied by an adult that can drive you back, and that is strong enough to help you if your legs get weak or numb from the local anesthetic.  Take all of your medicines the morning of the procedure with just enough water to swallow them.  If you have diabetes, make sure that you are scheduled to have your procedure done first thing in the morning, whenever possible.  If you have diabetes, take only half of your insulin dose and notify our nurse that you have done so as soon as you arrive at the clinic.  If you are diabetic, but only take blood sugar pills (oral hypoglycemic), then do not take them  on the morning of your procedure.  You may take them after you have had the procedure.  Do not take aspirin or any aspirin-containing medications, at least eleven (11) days prior to the procedure.  They may prolong bleeding.  Wear loose fitting clothing that may be easy to take off and that you would not mind if it got stained with Betadine or blood.  Do not wear any jewelry or perfume  Remove any nail coloring.  It will interfere with some of our monitoring equipment.  NOTE: Remember that this is not meant to be interpreted as a complete list of all possible complications.  Unforeseen problems may occur.  BLOOD THINNERS The following drugs contain aspirin or other products, which can cause increased bleeding during surgery and should not be  taken for 2 weeks prior to and 1 week after surgery.  If you should need take something for relief of minor pain, you may take acetaminophen which is found in Tylenol,m Datril, Anacin-3 and Panadol. It is not blood thinner. The products listed below are.  Do not take any of the products listed below in addition to any listed on your instruction sheet.  A.P.C or A.P.C with Codeine Codeine Phosphate Capsules #3 Ibuprofen Ridaura  ABC compound Congesprin Imuran rimadil  Advil Cope Indocin Robaxisal  Alka-Seltzer Effervescent Pain Reliever and Antacid Coricidin or Coricidin-D  Indomethacin Rufen  Alka-Seltzer plus Cold Medicine Cosprin Ketoprofen S-A-C Tablets  Anacin Analgesic Tablets or Capsules Coumadin Korlgesic Salflex  Anacin Extra Strength Analgesic tablets or capsules CP-2 Tablets Lanoril Salicylate  Anaprox Cuprimine Capsules Levenox Salocol  Anexsia-D Dalteparin Magan Salsalate  Anodynos Darvon compound Magnesium Salicylate Sine-off  Ansaid Dasin Capsules Magsal Sodium Salicylate  Anturane Depen Capsules Marnal Soma  APF Arthritis pain formula Dewitt's Pills Measurin Stanback  Argesic Dia-Gesic Meclofenamic Sulfinpyrazone  Arthritis Bayer  Timed Release Aspirin Diclofenac Meclomen Sulindac  Arthritis pain formula Anacin Dicumarol Medipren Supac  Analgesic (Safety coated) Arthralgen Diffunasal Mefanamic Suprofen  Arthritis Strength Bufferin Dihydrocodeine Mepro Compound Suprol  Arthropan liquid Dopirydamole Methcarbomol with Aspirin Synalgos  ASA tablets/Enseals Disalcid Micrainin Tagament  Ascriptin Doan's Midol Talwin  Ascriptin A/D Dolene Mobidin Tanderil  Ascriptin Extra Strength Dolobid Moblgesic Ticlid  Ascriptin with Codeine Doloprin or Doloprin with Codeine Momentum Tolectin  Asperbuf Duoprin Mono-gesic Trendar  Aspergum Duradyne Motrin or Motrin IB Triminicin  Aspirin plain, buffered or enteric coated Durasal Myochrisine Trigesic  Aspirin Suppositories Easprin Nalfon Trillsate  Aspirin with Codeine Ecotrin Regular or Extra Strength Naprosyn Uracel  Atromid-S Efficin Naproxen Ursinus  Auranofin Capsules Elmiron Neocylate Vanquish  Axotal Emagrin Norgesic Verin  Azathioprine Empirin or Empirin with Codeine Normiflo Vitamin E  Azolid Emprazil Nuprin Voltaren  Bayer Aspirin plain, buffered or children's or timed BC Tablets or powders Encaprin Orgaran Warfarin Sodium  Buff-a-Comp Enoxaparin Orudis Zorpin  Buff-a-Comp with Codeine Equegesic Os-Cal-Gesic   Buffaprin Excedrin plain, buffered or Extra Strength Oxalid   Bufferin Arthritis Strength Feldene Oxphenbutazone   Bufferin plain or Extra Strength Feldene Capsules Oxycodone with Aspirin   Bufferin with Codeine Fenoprofen Fenoprofen Pabalate or Pabalate-SF   Buffets II Flogesic Panagesic   Buffinol plain or Extra Strength Florinal or Florinal with Codeine Panwarfarin   Buf-Tabs Flurbiprofen Penicillamine   Butalbital Compound Four-way cold tablets Penicillin   Butazolidin Fragmin Pepto-Bismol   Carbenicillin Geminisyn Percodan   Carna Arthritis Reliever Geopen Persantine   Carprofen Gold's salt Persistin   Chloramphenicol Goody's Phenylbutazone   Chloromycetin  [place]rain Piroxlcam   Clmetidine heparin Plaquenil   Cllnoril Hyco-pap Ponstel   Clofibrate Hydroxy chloroquine Propoxyphen         Before stopping any of these medications, be sure to consult the physician who ordered them.  Some, such as Coumadin (Warfarin) are ordered to prevent or treat serious conditions such as "deep thrombosis", "pumonary embolisms", and other heart problems.  The amount of time that you may need off of the medication may also vary with the medication and the reason for which you were taking it.  If you are taking any of these medications, please make sure you notify your pain physician before you undergo any procedures.         Lumbar Sympathetic Block Patient Information  Description: The lumbar plexus is a group of nerves that are part of the sympathetic nervous  system.  These nerves supply organs in the pelvis and legs.  Lumbar sympathetic blocks are utilized for the diagnosis and treatment of painful conditions in these areas.   The lumbar plexus is located on both sides of the aorta at approximately the level of the second lumbar vertebral body.  The block will be performed with you lying on your abdomen with a pillow underneath.  Using direct x-ray guidance,   The plexus will be located on both sides of the spine.  Numbing medicine will be used to deaden the skin prior to needle insertion.  In most cases, a small amount of sedation can be give by IV prior to the numbing medicine.  One or two small needles will be placed near the plexus and local anesthetic will be injected.  This may make your leg(s) feel warm.  The Entire block usually lasts about 15-25 minutes.  Conditions which may be treated by lumbar sympathetic block:   Reflex sympathetic dystrophy  Phantom limb pain  Peripheral neuropathy  Peripheral vascular disease ( inadequate blood flow )  Cancer pain of pelvis, leg and kidney  Preparation for the injection:  1. Do note eat any solid food  or diary products within 8 hours of your appointment. 2. You may drink clear liquids up to 3 hours before appointment.  Clear liquids include water, black coffee, juice or soda.  No milk or cream please. 3. You may take your regular medication, including pain medications, with a sip of water before you appointment.  Diabetics should hold regular insulin ( if taken separately ) and take 1/2 NPH dose the morning of the procedure .  Carry some sugar containing items with you to your appointment. 4. A driver must accompany you and be prepared to drive you home after your procedure. 5. Bring all your current medication with you. 6. An IV may be inserted and sedation may be given at the discretion of the physician.  7. A blood pressure cuff, EKG and other monitors will often be applied during the procedure.  Some patients may need to have extra oxygen administered for a short period. 8. You will be asked to provide medical information, including your allergies and medications, prior to the procedure.  We must know immediately if your taking blood thinners (like Coumadin/Warfarin) or if you are allergic to IV iodine contrast (dye).  We must know if you could possibly be pregnant.  Possible side-effects   Bleeding from needle site or deeper  Infection (rare, can require surgery)  Nerve injury (rare)  Numbness & tingling (temporary)  Collapsed lung (rare)  Spinal headache (a headache worse with upright posture)  Light-headedness (temporary)  Pain at injection site (several days)  Decreased blood pressure (temporary)  Weakness in legs (temporary)  Seizure or other drug reaction (rare)  Call if you experience:   Fever/chills associated with headache or increased back/ neck pain  Headache worsened by an upright position  New onset weakness or numbness of an extremity below the injection site  Hives or difficulty breathing ( go to the emergency room)  Inflammation or drainage at the  injections site(s)  New symptoms which are concerning to you  Please note:  If effective, we will often do a series of 2-3 injections spaced 3-6 weeks apart to maximally decrease your pain.  If initial series is effective, you may be a candidate for a more permanent block of the lumbar sympathetic plexus.  If you have any questions please call [phone]  [facility]

## 2016-02-15 ENCOUNTER — Ambulatory Visit: Payer: Medicare Other | Admitting: Pain Medicine

## 2016-02-25 ENCOUNTER — Encounter: Payer: Self-pay | Admitting: Pain Medicine

## 2016-02-25 ENCOUNTER — Ambulatory Visit: Payer: Medicare Other | Attending: Pain Medicine | Admitting: Pain Medicine

## 2016-02-25 VITALS — BP 122/74 | HR 82 | Temp 97.8°F | Resp 16 | Ht 62.5 in | Wt 173.0 lb

## 2016-02-25 DIAGNOSIS — M545 Low back pain: Secondary | ICD-10-CM | POA: Insufficient documentation

## 2016-02-25 DIAGNOSIS — I872 Venous insufficiency (chronic) (peripheral): Secondary | ICD-10-CM

## 2016-02-25 DIAGNOSIS — G5620 Lesion of ulnar nerve, unspecified upper limb: Secondary | ICD-10-CM

## 2016-02-25 DIAGNOSIS — M5030 Other cervical disc degeneration, unspecified cervical region: Secondary | ICD-10-CM

## 2016-02-25 DIAGNOSIS — Z981 Arthrodesis status: Secondary | ICD-10-CM

## 2016-02-25 DIAGNOSIS — M533 Sacrococcygeal disorders, not elsewhere classified: Secondary | ICD-10-CM

## 2016-02-25 DIAGNOSIS — M47816 Spondylosis without myelopathy or radiculopathy, lumbar region: Secondary | ICD-10-CM

## 2016-02-25 DIAGNOSIS — M79606 Pain in leg, unspecified: Secondary | ICD-10-CM | POA: Diagnosis present

## 2016-02-25 DIAGNOSIS — Q761 Klippel-Feil syndrome: Secondary | ICD-10-CM

## 2016-02-25 DIAGNOSIS — M461 Sacroiliitis, not elsewhere classified: Secondary | ICD-10-CM

## 2016-02-25 DIAGNOSIS — M5136 Other intervertebral disc degeneration, lumbar region: Secondary | ICD-10-CM

## 2016-02-25 DIAGNOSIS — M47812 Spondylosis without myelopathy or radiculopathy, cervical region: Secondary | ICD-10-CM

## 2016-02-25 MED ORDER — MORPHINE SULFATE ER 15 MG PO TBCR: EXTENDED_RELEASE_TABLET | ORAL | Status: DC

## 2016-02-25 MED ORDER — OXYCODONE-ACETAMINOPHEN 5-325 MG PO TABS: ORAL_TABLET | ORAL | Status: DC

## 2016-02-25 NOTE — Patient Instructions (Addendum)
PLAN   Continue present medications Percocet and MS Contin   F/U PCP Dr. Doy Hutching for evaliation of  BP, swelling of lower extremities, and general medical  condition  F/U surgical evaluation. May consider pending follow-up evaluations  F/U neurological evaluation with Dr. Manuella Ghazi  F/U vascular evaluation Please see Dr. Lucky Cowboy as soon as possible as discussed for evaluation of lower extremity condition as we discussed today  May consider radiofrequency rhizolysis or intraspinal procedures pending response to present treatment and F/U evaluation . May consider repeating radiofrequency as discussed  Patient to call [facility] Management Center should patient have concerns prior to scheduled return appointment.

## 2016-02-25 NOTE — Progress Notes (Signed)
Safety precautions to be maintained throughout the outpatient stay will include: orient to surroundings, keep bed in low position, maintain call bell within reach at all times, provide assistance with transfer out of bed and ambulation.  

## 2016-02-25 NOTE — Progress Notes (Signed)
   Subjective:    Patient ID: Sabrina Stanley, female    DOB: 1961/02/27, 55 y.o.   MRN: [id number]  HPI  The patient is a 55 year old female who returns to pain management for further evaluation and treatment of pain involving the lower back and lower extremity region predominantly the patient is with history of surgical intervention of the cervical region as well. The patient is with significant pain of the lower extremities and is with significant swelling of the right lower extremity noted on today's visit. The patient was without tenderness to palpation of the lower extremity. The patient was with evidence of stasis dermatitis changes of the lower extremity and there was no increased warmth or erythema of the extremity. We discussed patient's condition and patient states that she is using pneumatic  vasocompressive device on the lower extremities. The patient is without the use of support stockings. We will have patient follow up with Dr.Dew for vascular assessment of the lower extremity and follow-up with Dr. Doy Hutching as well to discuss the lower extremity condition and patient's present medication regimen including diuretic therapy. We will avoid interventional treatment at this time and we will remain available to consider modification of treatment regimen pending follow-up evaluation. All agreed to suggested treatment plan   Review of Systems     Objective:   Physical Exam   There was tenderness of the splenius capitis and occipitalis region palpation which reproduces mild to moderate discomfort There was tenderness to palpation over the region of the acromioclavicular and glenohumeral joint regions palpation which reproduces mild discomfort. The patient appeared to be with unremarkable Spurling's maneuver. Palpation over the thoracic region thoracic facet region was attends to palpation of moderate degree as well as palpation over the cervical paraspinal musculature region. There was no crepitus  of the thoracic region noted. The patient was with slightly decreased grip strength without increase of pain with Tinel and Phalen's maneuver. Palpation over the region of the lumbar region lumbar facet region was with moderate tenderness to palpation with lateral bending rotation extension and palpation over the lumbar facets reproducing moderate discomfort. Palpation over the region of the PSIS and PII S regions reproduce moderate discomfort with mild tenderness of the greater trochanteric region iliotibial band region. Straight leg raising was tolerates approximately 20 without increased pain with dorsiflexion noted. There was stasis steroid dermatitis changes of the lower extremity noted with the right lower extremity swollen compared to the left lower extremity with negative clonus negative Homans. EHL strength appeared to be slightly decreased. No sensory deficit or dermatomal distribution detected. Her nontender with no costovertebral tenderness noted     Assessment & Plan:     PLAN   Continue present medications Percocet and MS Contin   F/U PCP Dr. Doy Hutching for evaliation of  BP, swelling of lower extremities, and general medical  condition  F/U surgical evaluation. May consider pending follow-up evaluations  F/U neurological evaluation with Dr. Manuella Ghazi  F/U vascular evaluation Please see Dr. Lucky Cowboy as soon as possible as discussed for evaluation of lower extremity condition as we discussed today  May consider radiofrequency rhizolysis or intraspinal procedures pending response to present treatment and F/U evaluation . May consider repeating radiofrequency as discussed  Patient to call Pain Management Center should patient have concerns prior to scheduled return appointment.

## 2016-02-29 ENCOUNTER — Other Ambulatory Visit: Payer: Self-pay | Admitting: Internal Medicine

## 2016-02-29 ENCOUNTER — Ambulatory Visit
Admission: RE | Admit: 2016-02-29 | Discharge: 2016-02-29 | Disposition: A | Discharge status: Home / Self Care | Payer: Medicare Other | Source: Ambulatory Visit | Attending: Internal Medicine | Admitting: Internal Medicine

## 2016-02-29 DIAGNOSIS — J9811 Atelectasis: Secondary | ICD-10-CM | POA: Diagnosis not present

## 2016-02-29 DIAGNOSIS — R0602 Shortness of breath: Secondary | ICD-10-CM

## 2016-02-29 DIAGNOSIS — R059 Cough, unspecified: Secondary | ICD-10-CM

## 2016-02-29 DIAGNOSIS — R600 Localized edema: Secondary | ICD-10-CM | POA: Insufficient documentation

## 2016-02-29 DIAGNOSIS — R05 Cough: Secondary | ICD-10-CM | POA: Diagnosis present

## 2016-02-29 DIAGNOSIS — R911 Solitary pulmonary nodule: Secondary | ICD-10-CM | POA: Diagnosis not present

## 2016-02-29 MED ORDER — IOPAMIDOL (ISOVUE-370) INJECTION 76%
75.0000 mL | Freq: Once | INTRAVENOUS | Status: AC | PRN
  Administered 2016-02-29: 75 mL via INTRAVENOUS

## 2016-03-22 ENCOUNTER — Ambulatory Visit: Payer: Medicare Other | Attending: Pain Medicine | Admitting: Pain Medicine

## 2016-03-22 ENCOUNTER — Other Ambulatory Visit: Payer: Self-pay | Admitting: Pain Medicine

## 2016-03-22 ENCOUNTER — Encounter: Payer: Self-pay | Admitting: Pain Medicine

## 2016-03-22 VITALS — BP 106/69 | HR 87 | Temp 98.6°F | Resp 15 | Ht 62.0 in | Wt 178.0 lb

## 2016-03-22 DIAGNOSIS — M5481 Occipital neuralgia: Secondary | ICD-10-CM | POA: Insufficient documentation

## 2016-03-22 DIAGNOSIS — M542 Cervicalgia: Secondary | ICD-10-CM | POA: Diagnosis present

## 2016-03-22 DIAGNOSIS — I7389 Other specified peripheral vascular diseases: Secondary | ICD-10-CM | POA: Insufficient documentation

## 2016-03-22 DIAGNOSIS — M5030 Other cervical disc degeneration, unspecified cervical region: Secondary | ICD-10-CM | POA: Insufficient documentation

## 2016-03-22 DIAGNOSIS — M533 Sacrococcygeal disorders, not elsewhere classified: Secondary | ICD-10-CM | POA: Insufficient documentation

## 2016-03-22 DIAGNOSIS — M5136 Other intervertebral disc degeneration, lumbar region: Secondary | ICD-10-CM | POA: Insufficient documentation

## 2016-03-22 DIAGNOSIS — G5620 Lesion of ulnar nerve, unspecified upper limb: Secondary | ICD-10-CM

## 2016-03-22 DIAGNOSIS — M47816 Spondylosis without myelopathy or radiculopathy, lumbar region: Secondary | ICD-10-CM | POA: Diagnosis not present

## 2016-03-22 DIAGNOSIS — R600 Localized edema: Secondary | ICD-10-CM | POA: Insufficient documentation

## 2016-03-22 DIAGNOSIS — Z981 Arthrodesis status: Secondary | ICD-10-CM

## 2016-03-22 MED ORDER — MORPHINE SULFATE ER 15 MG PO TBCR
EXTENDED_RELEASE_TABLET | ORAL | 0 refills | Status: DC
Start: 2016-03-22 — End: 2016-04-20

## 2016-03-22 MED ORDER — OXYCODONE-ACETAMINOPHEN 5-325 MG PO TABS: ORAL_TABLET | ORAL | 0 refills | Status: DC

## 2016-03-22 NOTE — Progress Notes (Signed)
Subjective:     Patient ID: Sabrina Stanley, female   DOB: Sep 08, 1960, 55 y.o.   MRN: [id number]  HPI  The patient is a 55 year old female who returns to pain management for further evaluation and treatment of pain involving the neck upper extremity regions as well as the mid lower back lower extremity region and headache. The patient is with pain fairly well-controlled at this time and has had significant decrease in swelling of the lower extremities. We discussed patient's condition on today's visit and we will continue oxycodone acetaminophen for breakthrough pain palpation continues MS Contin. All agreed with suggested treatment plan. The patient has been wearing support stockings and using pneumatic compression device which has happened with the lower extremity swelling significantly. The patient also has watched her diet and has taken diuretic therapy. We will continue present treatment regimen and patient will call pain management should they be change in condition prior to scheduled return appointment.     Review of Systems     Objective:   Physical Exam    there was tenderness of the splenius capitis and occipitalis region a moderate degree with well-healed scar of the cervical region without increased warmth or erythema in the region scar. The patient was with tenderness to palpation of the acromioclavicular and glenohumeral joint regions of moderate degree and was able to perform drop test with moderate difficulty. There was well-healed surgical scar of the right upper extremity at the level of the olecranon. There was tenderness to palpation of the medial and lateral epicondyles of the elbow. Palpation of the thoracic region was without crepitus of the thoracic region. The patient was mild increase of pain with Tinel and Phalen's maneuver. The patient was with decreased grip strength. Palpation over the region of the lumbar region was of increased pain with lateral bending rotation  extension and palpation over the lumbar facets. Was tenderness of the PSIS and PII S region a moderate degree. There was straight leg raising tolerates approximately 20 without increased pain with dorsiflexion noted. There was 1+ lower extremity edema without increased warmth and erythema in the region of the lower extremities. There was negative clonus negative Homans. Knees were attends to palpation in crepitus of the knees with negative anterior and posterior drawer signs without ballottement of the patella. 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 Doy Hutching for evaliation of  BP, swelling of lower extremities, and general medical  condition. Lower extremity swelling has improved  F/U surgical evaluation. May consider pending follow-up evaluations  F/U neurological evaluation with Dr. Manuella Ghazi  F/U vascular evaluation Follow up with Dr.Dew as discussed   May consider radiofrequency rhizolysis or intraspinal procedures pending response to present treatment and F/U evaluation . May consider repeating radiofrequency as discussed  Patient to call Pain Management Center should patient have concerns prior to scheduled return appointment.

## 2016-03-22 NOTE — Progress Notes (Deleted)
   The patient is a 55 year old female who returns to pain management for further evaluation and treatment of pain involving the neck upper extremity regions entire back lower back and lower extremity regions. The patient has had significant improvement in the lower extremity edema. The patient has been wearing support stockings as well as taking diuretics and has been using pneumatic compression device for the lower extremities. The patient denies any trauma change in events of daily living the call significant change in symptomatology. The patient continues to MS Contin and oxycodone acetaminophen of breakthrough pain. We will continue presently prescribed medications at this time. Patient is with pain fairly well-controlled. All agreed to suggested treatment plan      Physical examination  There was tenderness of the splenius capitis and occipitalis region of moderate degree with well-healed scar of the cervical region without increased warmth and erythema in the region of the scar. The patient appeared to be with decreased grip strength. There was well-healed scar of the left upper extremity at the level of the olecranon. There was tenderness to palpation of the medial and lateral epicondyles of the elbow of moderate degree. Palpation of the acromioclavicular and glenohumeral joint regions reproduce moderate discomfort. The patient had mild difficulty to moderate difficulty performing drop test. There was unremarkable Spurling's maneuver. Palpation over the thoracic region was attends to palpation without crepitus of the thoracic region noted. Palpation over the lumbar region was with moderate increase of pain with lateral bending rotation extension and palpation of the lumbar facets reproducing moderate discomfort. There was tenderness over the PSIS and PII S regions of moderate degree with mild tenderness of the greater trochanteric region iliotibial band region. Straight leg raise was tolerates  approximately 20 without increase of pain with dorsiflexion noted. There was tenderness to palpation of the knees with crepitus of the knees with negative anterior and posterior drawer signs. EHL strength appeared to be decreased. No sensory deficit or dermatomal dystrophy detected. There was trace lower extremity edema without increased warmth and erythema in the region of the lower extremities there was negative clonus negative Homans. Abdomen was nontender with no costovertebral tenderness noted     Assessment  Peripheral vascular disease of lower extremities  Degenerative disc disease lumbar spine Degenerative changes L2-3 L4, L4-5, and L5-S1 most significantly involved  Lumbar facet syndrome  Sacroiliac joint dysfunction  Degenerative disc disease cervical spine Status post cervical fusion  Carpal tunnel syndrome versus cervical radiculopathy versus ulnar neuropathy  Bilateral occipital neuralgia     PLAN   Continue present medications Percocet and MS Contin   F/U PCP Dr. Doy Hutching for evaliation of  BP, swelling of lower extremities, and general medical  condition. Condition of lower extremities has improved at this time  F/U surgical evaluation. May consider pending follow-up evaluations  F/U neurological evaluation with Dr. Manuella Ghazi  F/U vascular evaluation Patient will follow-up with Dr. Lucky Cowboy as discussed and we'll continue use of pneumatic compression devices and will wear support stockings as discussed  May consider radiofrequency rhizolysis or intraspinal procedures pending response to present treatment and F/U evaluation . May consider repeating radiofrequency as discussed  Patient to call Pain Management Center should patient have concerns prior to scheduled return appointment.

## 2016-03-22 NOTE — Patient Instructions (Signed)
PLAN   Continue present medications Percocet and MS Contin   F/U PCP Dr. Doy Hutching for evaliation of  BP, swelling of lower extremities, and general medical  condition  F/U surgical evaluation. May consider pending follow-up evaluations  F/U neurological evaluation with Dr. Manuella Ghazi  F/U vascular evaluation Follow up with Dr.Dew as discussed   May consider radiofrequency rhizolysis or intraspinal procedures pending response to present treatment and F/U evaluation . May consider repeating radiofrequency as discussed  Patient to call Pain Management Center should patient have concerns prior to scheduled return appointment.

## 2016-04-20 ENCOUNTER — Encounter: Payer: Self-pay | Admitting: Pain Medicine

## 2016-04-20 ENCOUNTER — Ambulatory Visit: Payer: Medicare Other | Attending: Pain Medicine | Admitting: Pain Medicine

## 2016-04-20 VITALS — BP 122/76 | HR 83 | Temp 98.2°F | Resp 16 | Ht 62.5 in | Wt 180.0 lb

## 2016-04-20 DIAGNOSIS — I739 Peripheral vascular disease, unspecified: Secondary | ICD-10-CM | POA: Insufficient documentation

## 2016-04-20 DIAGNOSIS — M545 Low back pain: Secondary | ICD-10-CM | POA: Diagnosis present

## 2016-04-20 DIAGNOSIS — Z981 Arthrodesis status: Secondary | ICD-10-CM

## 2016-04-20 DIAGNOSIS — M5136 Other intervertebral disc degeneration, lumbar region: Secondary | ICD-10-CM | POA: Insufficient documentation

## 2016-04-20 DIAGNOSIS — M7989 Other specified soft tissue disorders: Secondary | ICD-10-CM | POA: Diagnosis not present

## 2016-04-20 DIAGNOSIS — M533 Sacrococcygeal disorders, not elsewhere classified: Secondary | ICD-10-CM | POA: Diagnosis not present

## 2016-04-20 DIAGNOSIS — R600 Localized edema: Secondary | ICD-10-CM

## 2016-04-20 DIAGNOSIS — M47816 Spondylosis without myelopathy or radiculopathy, lumbar region: Secondary | ICD-10-CM | POA: Diagnosis not present

## 2016-04-20 DIAGNOSIS — G5620 Lesion of ulnar nerve, unspecified upper limb: Secondary | ICD-10-CM

## 2016-04-20 DIAGNOSIS — M5030 Other cervical disc degeneration, unspecified cervical region: Secondary | ICD-10-CM | POA: Diagnosis not present

## 2016-04-20 DIAGNOSIS — M542 Cervicalgia: Secondary | ICD-10-CM | POA: Diagnosis present

## 2016-04-20 DIAGNOSIS — M5481 Occipital neuralgia: Secondary | ICD-10-CM | POA: Insufficient documentation

## 2016-04-20 DIAGNOSIS — I872 Venous insufficiency (chronic) (peripheral): Secondary | ICD-10-CM | POA: Insufficient documentation

## 2016-04-20 MED ORDER — MORPHINE SULFATE ER 15 MG PO TBCR: EXTENDED_RELEASE_TABLET | ORAL | 0 refills | Status: AC

## 2016-04-20 MED ORDER — OXYCODONE-ACETAMINOPHEN 5-325 MG PO TABS: ORAL_TABLET | ORAL | 0 refills | Status: DC

## 2016-04-20 NOTE — Patient Instructions (Addendum)
PLAN   Continue present medications Percocet and MS Contin   F/U PCP Dr. Doy Hutching for evaliation of  BP, swelling of lower extremities, and general medical  condition  F/U surgical evaluation. May consider pending follow-up evaluations  F/U neurological evaluation with Dr. Manuella Ghazi  F/U vascular evaluation Follow up with Dr.Dew   May consider radiofrequency rhizolysis or intraspinal procedures pending response to present treatment and F/U evaluation . May consider repeating radiofrequency as discussed  Patient to call Pain Management Center should patient have concerns prior to scheduled return appointment.Pain Management Discharge Instructions  General Discharge Instructions :  If you need to reach your doctor call: Monday-Friday 8:00 am - 4:00 pm at [phone] or toll free [phone].  After clinic hours [phone] to have operator reach doctor.  Bring all of your medication bottles to all your appointments in the pain clinic.  To cancel or reschedule your appointment with Pain Management please remember to call 24 hours in advance to avoid a fee.  Refer to the educational materials which you have been given on: General Risks, I had my Procedure. Discharge Instructions, Post Sedation.  Post Procedure Instructions:  The drugs you were given will stay in your system until tomorrow, so for the next 24 hours you should not drive, make any legal decisions or drink any alcoholic beverages.  You may eat anything you prefer, but it is better to start with liquids then soups and crackers, and gradually work up to solid foods.  Please notify your doctor immediately if you have any unusual bleeding, trouble breathing or pain that is not related to your normal pain.  Depending on the type of procedure that was done, some parts of your body may feel week and/or numb.  This usually clears up by tonight or the next day.  Walk with the use of an assistive device or accompanied by an adult  for the 24 hours.  You may use ice on the affected area for the first 24 hours.  Put ice in a Ziploc bag and cover with a towel and place against area 15 minutes on 15 minutes off.  You may switch to heat after 24 hours.

## 2016-04-20 NOTE — Progress Notes (Signed)
    The patient is a 55 year old female who returns to pain management for further evaluation and treatment of pain involving the neck upper extremity regions mid back lower back and lower extremity region. The patient states the pain present time is fairly well controlled. The patient has been busy caring for her husband. The patient states that the lower extremity pain has been associated with some swelling of the lower extremities. The patient denies any significant change of her condition at this time. The patient states that the pain of the lower extremities is increased with standing and walking and standing and walking increase of the swelling of the lower extremities. We discussed need for patient to consider wearing support stockings as well as using pneumatic compression hose device. At the present time we will continue patient's oxycodone acetaminophen and MS Contin and we will remain alive consider interventional treatment as discussed and as explained to patient on today's visit was understanding and in agreement with suggested treatment plan.      Physical examination  There was tenderness to palpation of the splenius capitis and occipitalis region palpation which be produced moderate discomfort. There appeared to be unremarkable Spurling's maneuver. Palpation over the cervical facet cervical paraspinal musculature region was associated with mild to moderate discomfort with mild to moderate tenderness over the thoracic facet thoracic paraspinal musculature region. Palpation of the acromioclavicular and glenohumeral joint regions reproduce moderate discomfort and patient was with moderate difficulty performed the drop test. The patient was with questionably decreased grip strength without severe increased pain with Tinel and Phalen's maneuver noted. Palpation over the thoracic region was attends to palpation without crepitus of the thoracic region noted. Palpation over the region of the lumbar  region was with moderate tenderness to palpation with lateral bending rotation extension and palpation of the lumbar facets reproducing moderate discomfort. Palpation over the PSIS and PII S region reproduced moderate discomfort with moderate tenderness along the greater trochanteric region iliotibial band region. EHL strength appeared to be slightly decreased and there was no definite sensory deficit or dermatomal region of the lower extremities noted.. Straight leg raising was tolerates approximately 20 without a definite increase of pain with dorsiflexion noted. There was 1+ lower extremity edema of the lower extremities noted with stasis dermatitis changes of the lower extremities noted there appeared to be negative clonus negative Homans and abdomen was without excessive tenderness to palpation and no costovertebral tenderness was noted.    Assessment  Peripheral vascular disease of lower extremities  Degenerative disc disease lumbar spine Degenerative changes L2-3 L4, L4-5, and L5-S1 most significantly involved  Lumbar facet syndrome  Sacroiliac joint dysfunction  Degenerative disc disease cervical spine Status post cervical fusion  Carpal tunnel syndrome versus cervical radiculopathy versus ulnar neuropathy  Bilateral occipital neuralgia     PLAN   Continue present medications Percocet and MS Contin   F/U PCP Dr. Doy Hutching for evaliation of  BP, swelling of lower extremities, and general medical  condition  F/U surgical evaluation. May consider pending follow-up evaluations  F/U neurological evaluation with Dr. Manuella Ghazi  F/U vascular evaluation Follow up with Dr.Dew   May consider radiofrequency rhizolysis or intraspinal procedures pending response to present treatment and F/U evaluation . May consider repeating radiofrequency as discussed    Patient to call Pain Management Center should patient have concerns prior to scheduled return appointment.

## 2016-04-20 NOTE — Progress Notes (Signed)
Safety precautions to be maintained throughout the outpatient stay will include: orient to surroundings, keep bed in low position, maintain call bell within reach at all times, provide assistance with transfer out of bed and ambulation.  

## 2016-05-03 ENCOUNTER — Telehealth: Payer: Self-pay | Admitting: *Deleted

## 2016-05-18 ENCOUNTER — Ambulatory Visit: Payer: Medicare Other | Admitting: Pain Medicine

## 2016-05-22 ENCOUNTER — Other Ambulatory Visit: Payer: Self-pay | Admitting: Pain Medicine

## 2017-01-11 ENCOUNTER — Encounter (INDEPENDENT_AMBULATORY_CARE_PROVIDER_SITE_OTHER): Payer: Self-pay | Admitting: Vascular Surgery

## 2017-01-11 ENCOUNTER — Ambulatory Visit (INDEPENDENT_AMBULATORY_CARE_PROVIDER_SITE_OTHER): Payer: Medicare Other | Admitting: Vascular Surgery

## 2017-01-11 VITALS — BP 117/73 | HR 83 | Resp 17 | Wt 181.6 lb

## 2017-01-11 DIAGNOSIS — I890 Lymphedema, not elsewhere classified: Secondary | ICD-10-CM | POA: Diagnosis not present

## 2017-01-11 NOTE — Progress Notes (Signed)
Subjective:    Patient ID: Sabrina Stanley, female    DOB: 1961/04/28, 56 y.o.   MRN: [id number] Chief Complaint  Patient presents with  . Leg Swelling   Patient presents with worsening bilateral (L>R) lower extremity edema. She has a lymphedema pump at home. She is unable to wear medical grade one compression as they are uncomfortable. States she engages in lower extremity elevation. Patient most recent imaging was LLE DVT study (negative) 02/2016. Patient is experiencing lower extremity pain associated with the edema. Pain is worsening prompting her to seek medical attention.    Review of Systems  Constitutional: Negative.   HENT: Negative.   Eyes: Negative.   Respiratory: Negative.   Cardiovascular: Positive for leg swelling.       Pain associated with the lower extremity swelling  Gastrointestinal: Negative.   Endocrine: Negative.   Genitourinary: Negative.   Musculoskeletal: Negative.   Skin: Negative.   Allergic/Immunologic: Negative.   Neurological: Negative.   Hematological: Negative.   Psychiatric/Behavioral: Negative.       Objective:   Physical Exam  Constitutional: She is oriented to person, place, and time. She appears well-developed and well-nourished. No distress.  HENT:  Head: Normocephalic and atraumatic.  Eyes: Conjunctivae are normal. Pupils are equal, round, and reactive to light.  Neck: Normal range of motion.  Cardiovascular: Normal rate, regular rhythm and normal heart sounds.   Pulses:      Radial pulses are 2+ on the right side, and 2+ on the left side.  Hard to appreciate pedal pulses due to edema  Pulmonary/Chest: Effort normal.  Musculoskeletal: Normal range of motion. She exhibits edema (Moderate 1+ Pitting Edema).  Neurological: She is alert and oriented to person, place, and time.  Skin: Skin is warm and dry. She is not diaphoretic.  Psychiatric: She has a normal mood and affect. Her behavior is normal. Judgment and thought content normal.    Vitals reviewed.  BP 117/73   Pulse 83   Resp 17   Wt 181 lb 9.6 oz (82.4 kg)   BMI 32.69 kg/m   Past Medical History:  Diagnosis Date  . Abnormal mammogram, unspecified   . Alcoholism ([place])   . Allergic state   . Anemia   . Anxiety   . Arthritis   . Bilateral breast cysts   . Bipolar 1 disorder ([place])   . Bipolar 1 disorder ([place])   . Bone marrow disorder   . Bronchitis   . Cervical stenosis of spine   . Cervicalgia   . Chronic diarrhea   . Chronic fatigue disorder   . Chronic pain   . COPD (chronic obstructive pulmonary disease) ([place])   . Depression   . Depression   . Eczema   . Esophageal reflux   . Fibromyalgia   . GERD (gastroesophageal reflux disease)   . Headache   . Hiatal hernia   . History of esophagogastroduodenoscopy (EGD)   . Hyperlipidemia   . Hyperlipidemia   . Hypertension   . Hypothyroidism   . Insomnia   . Lithium toxicity   . Menstrual irregularity   . Myalgia   . Osteopenia   . Osteopenia   . Panic disorder   . Pleurisy   . Pneumonia   . Psoriasis   . PVD (peripheral vascular disease) ([place])   . Sleep apnea   . Tobacco abuse   . UTI (urinary tract infection)   . Vitamin D deficiency    Social History  Social History  . Marital status: Married    Spouse name: N/A  . Number of children: N/A  . Years of education: N/A   Occupational History  . Not on file.   Social History Main Topics  . Smoking status: Current Every Day Smoker    Packs/day: 1.00    Years: 35.00    Types: Cigarettes  . Smokeless tobacco: Never Used  . Alcohol use No  . Drug use: No  . Sexual activity: Not on file   Other Topics Concern  . Not on file   Social History Narrative  . No narrative on file   Past Surgical History:  Procedure Laterality Date  . ABDOMINAL HYSTERECTOMY  1996  . BREAST BIOPSY Right 08-21-12   neg  . COLONOSCOPY    . [place]  . ESOPHAGOGASTRODUODENOSCOPY    .  ESOPHAGOGASTRODUODENOSCOPY N/A 09/17/2015   Procedure: ESOPHAGOGASTRODUODENOSCOPY (EGD);  Surgeon: Hulen Luster, MD;  Location: [facility] ENDOSCOPY;  Service: Gastroenterology;  Laterality: N/A;  . EYE SURGERY  2010  . KNEE SURGERY Right 1998   meniscus repair  . NASAL SINUS SURGERY  1998, 2000  . NECK SURGERY     x 2  . OOPHORECTOMY    . SPINE SURGERY  2006, 2008   cervical fusion  . TONSILLECTOMY      Family History  Problem Relation Age of Onset  . Lung cancer Father   . Cancer Father   . Depression Mother   . Hypertension Mother   . Breast cancer Maternal Aunt        great aunt  . Lung cancer Paternal Uncle   . Cervical cancer Maternal Grandmother    Allergies  Allergen Reactions  . Asa [Aspirin]     Stomach pain  . Darvocet [Propoxyphene N-Acetaminophen]     Stomach pain  . Effexor [Venlafaxine Hcl] Other (See Comments)    hallucinations  . Neurontin [Gabapentin]     fatigue  . Nsaids     Bad stomach ache   . Mobic [Meloxicam] Palpitations  . Penicillins Rash      Assessment & Plan:  Patient presents with worsening bilateral (L>R) lower extremity edema. She has a lymphedema pump at home. She is unable to wear medical grade one compression as they are uncomfortable. States she engages in lower extremity elevation. Patient most recent imaging was LLE DVT study (negative) 02/2016. Patient is experiencing lower extremity pain associated with the edema. Pain is worsening prompting her to seek medical attention.   Lower Extremity Lymphedema - Worsening I discussed lymphedema and what symptoms it causes and how to manage them. The patient was encouraged to wear graduated compression stockings (20-30 mmHg) on a daily basis. The patient was instructed to begin wearing the stockings first thing in the morning and removing them in the evening. The patient was instructed specifically not to sleep in the stockings. Prescription given. In addition, behavioral modification including  elevation during the day will be initiated. Patient to use lymphedema pump at least once a day for an hour.  Patient did not want bilateral unna wraps placed. Will bring patient back for repeat venous duplex to assess anatomy / reflux.  Current Outpatient Prescriptions on File Prior to Visit  Medication Sig Dispense Refill  . ABILIFY 5 MG tablet 5 mg daily.     . Biotin 5 MG CAPS Take 1,000 mg by mouth daily.    . divalproex (DEPAKOTE ER) 250 MG 24  hr tablet Take 250 mg by mouth daily. 3 tablets at bedtime    . FLUoxetine (PROZAC) 20 MG capsule Take 60 mg by mouth daily.     . Fluticasone-Salmeterol (ADVAIR) 100-50 MCG/DOSE AEPB Inhale 1 puff into the lungs daily as needed.    Marland Kitchen guaiFENesin-codeine (ROBITUSSIN AC) 100-10 MG/5ML syrup Take 5 mLs by mouth 4 (four) times daily as needed for cough.    . hydrOXYzine (ATARAX/VISTARIL) 50 MG tablet Take 50 mg by mouth every 6 (six) hours as needed. Reported on 09/03/2015    . levofloxacin (LEVAQUIN) 500 MG tablet Take 500 mg by mouth daily. Reported on 01/26/2016    . morphine (MS CONTIN) 15 MG 12 hr tablet Limit 2-3 tabs by mouth per day if tolerated 90 tablet 0  . omeprazole (PRILOSEC) 40 MG capsule Take 40 mg by mouth daily.    Marland Kitchen oxyCODONE-acetaminophen (PERCOCET/ROXICET) 5-325 MG tablet Limit 2-4 tabs per day if tolerated for breakthrough pain if tolerated while taking MS Contin 120 tablet 0  . potassium chloride SA (K-DUR,KLOR-CON) 20 MEQ tablet Take 20 mEq by mouth daily.    . primidone (MYSOLINE) 50 MG tablet Take 50 mg by mouth 2 (two) times daily.    Marland Kitchen rOPINIRole (REQUIP) 1 MG tablet Take 1 mg by mouth at bedtime.     . SUMAtriptan (IMITREX) 100 MG tablet Take 100 mg by mouth every 2 (two) hours as needed for migraine.    Marland Kitchen tiZANidine (ZANAFLEX) 4 MG capsule Take 4 mg by mouth at bedtime.     . topiramate (TOPAMAX) 100 MG tablet Take 100 mg by mouth 2 (two) times daily.    Marland Kitchen torsemide (DEMADEX) 20 MG tablet Take 20 mg by mouth 2 (two) times  daily.    . ciprofloxacin (CIPRO) 250 MG tablet Take 1 tablet (250 mg total) by mouth 2 (two) times daily. (Patient not taking: Reported on 04/20/2016) 14 tablet 0  . CRESTOR 40 MG tablet Take 40 mg by mouth daily. Reported on 01/26/2016    . metoCLOPramide (REGLAN) 5 MG tablet Take 5 mg by mouth 2 (two) times daily. Reported on 01/26/2016    . zolpidem (AMBIEN) 10 MG tablet Take 10 mg by mouth daily.      Current Facility-Administered Medications on File Prior to Visit  Medication Dose Route Frequency Provider Last Rate Last Dose  . bupivacaine (PF) (MARCAINE) 0.25 % injection 30 mL  30 mL Other Once Mohammed Kindle, MD      . ciprofloxacin (CIPRO) IVPB 400 mg  400 mg Intravenous Once Mohammed Kindle, MD      . fentaNYL (SUBLIMAZE) injection 100 mcg  100 mcg Intravenous Once Mohammed Kindle, MD      . lidocaine (PF) (XYLOCAINE) 1 % injection 10 mL  10 mL Subcutaneous Once Mohammed Kindle, MD      . midazolam (VERSED) 5 MG/5ML injection 5 mg  5 mg Intravenous Once Mohammed Kindle, MD      . orphenadrine (NORFLEX) injection 60 mg  60 mg Intramuscular Once Mohammed Kindle, MD        There are no Patient Instructions on file for this visit. No Follow-up on file.   Brendi Mccarroll A Breelyn Icard, PA-C

## 2017-01-16 DIAGNOSIS — I890 Lymphedema, not elsewhere classified: Secondary | ICD-10-CM | POA: Insufficient documentation

## 2017-03-23 IMAGING — RF DG SWALLOWING FUNCTION
12 of 16 series · 12 of 24 positions shown · non-contrast
Comparison: Standard barium swallow examination September 10, 2015

CLINICAL DATA: The patient underwent a swallowing study
approximately 5 weeks ago which revealed a narrowed proximal
cervical esophagus which would not allow passage of the 13 mm barium
tablet.

EXAM:
MODIFIED BARIUM SWALLOW
TECHNIQUE: Different consistencies of barium were administered orally to the
patient by the Speech Pathologist. Imaging of the pharynx was
performed in the lateral projection.
FLUOROSCOPY TIME:  Fluoroscopy Time:  1 minutes, 24 seconds.
Number of Acquired Images:  15 video clips

[Series 1: run · 1 of 103 frames shown (1 of 12)]
[frame 88/103]
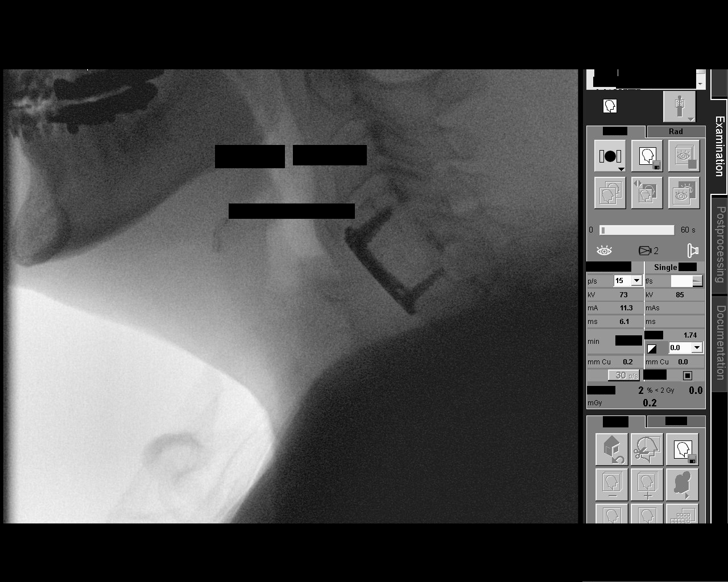

[Series 3: run · 1 of 123 frames shown (2 of 12)]
[frame 19/123]
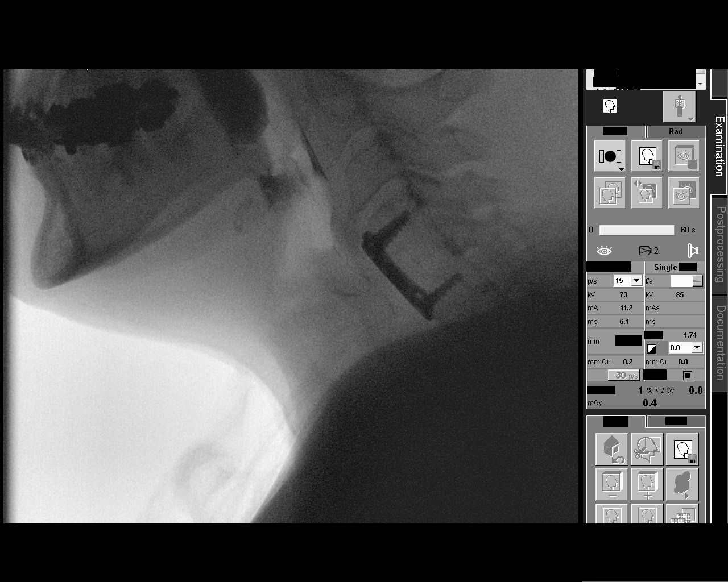

[Series 4: run · 1 of 313 frames shown (3 of 12)]
[frame 267/313]
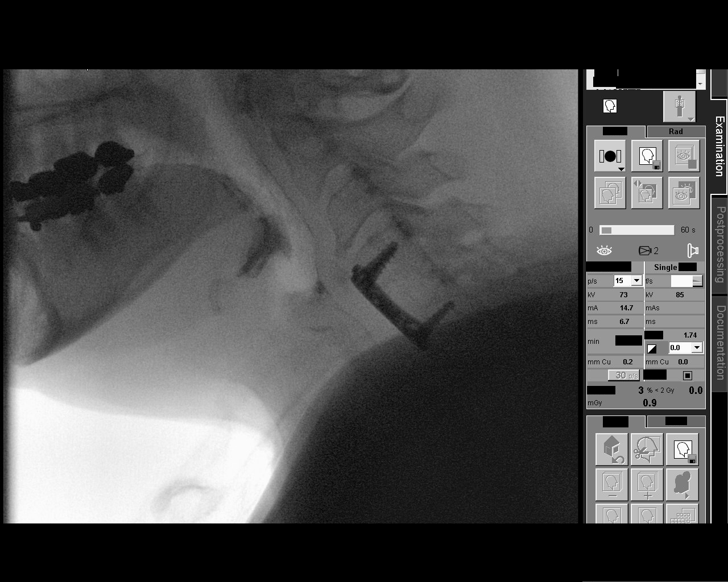

[Series 5: run · 1 of 196 frames shown (4 of 12)]
[frame 167/196]
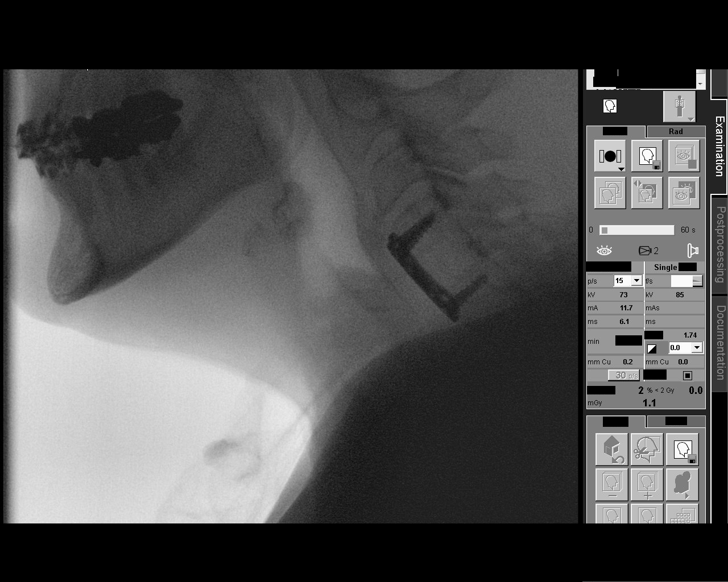

[Series 7: run · 1 of 273 frames shown (5 of 12)]
[frame 41/273]
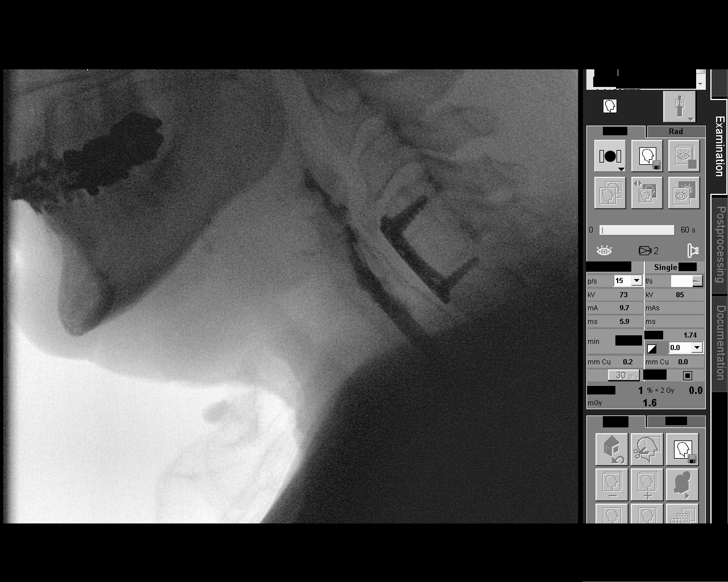

[Series 8: run · 1 of 315 frames shown (6 of 12)]
[frame 158/315]
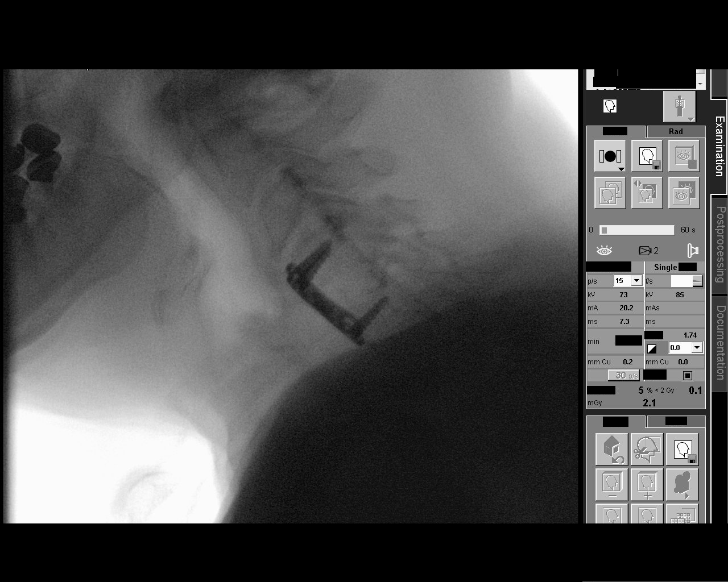

[Series 10: run · 1 of 14 frames shown (7 of 12)]
[frame 3/14]
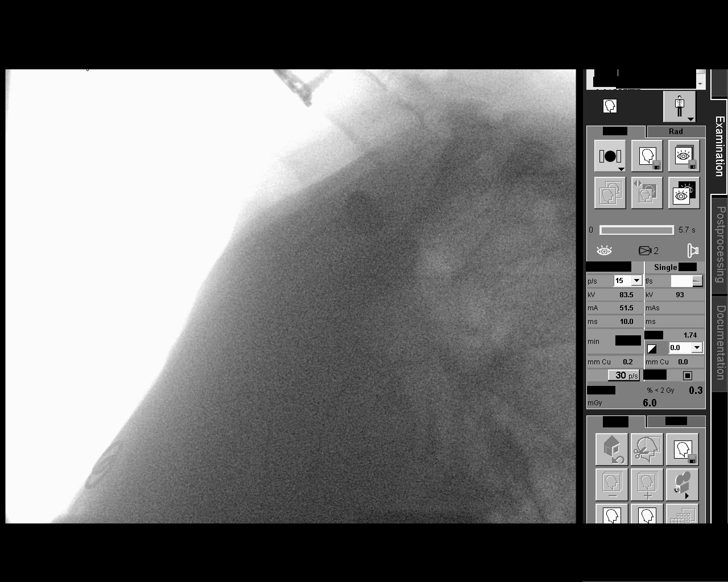

[Series 11: run · 1 of 138 frames shown (8 of 12)]
[frame 21/138]
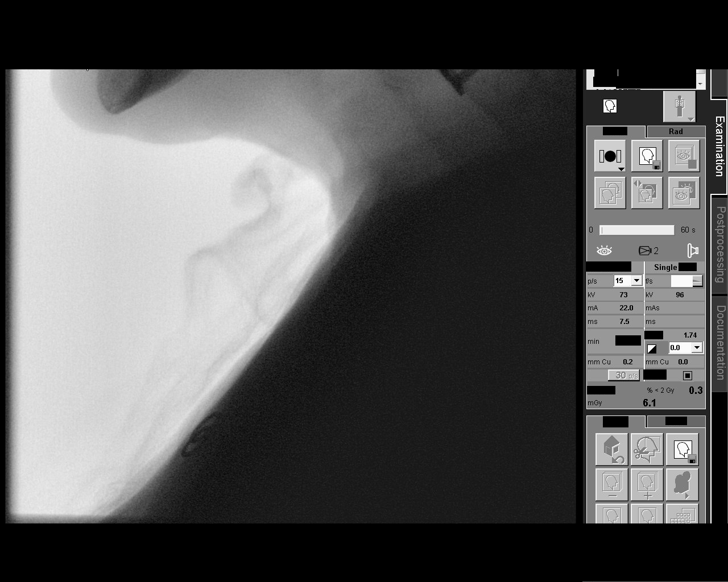

[Series 12: run · 1 of 49 frames shown (9 of 12)]
[frame 47/49]
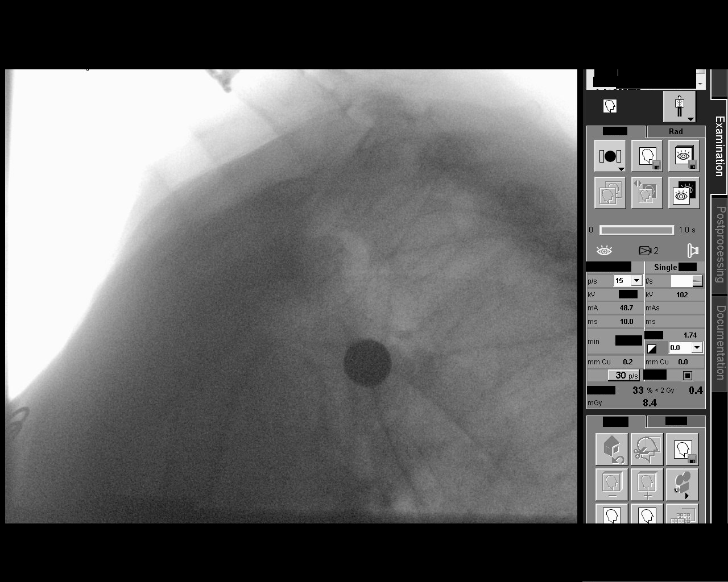

[Series 14: run · 1 of 67 frames shown (10 of 12)]
[frame 11/67]
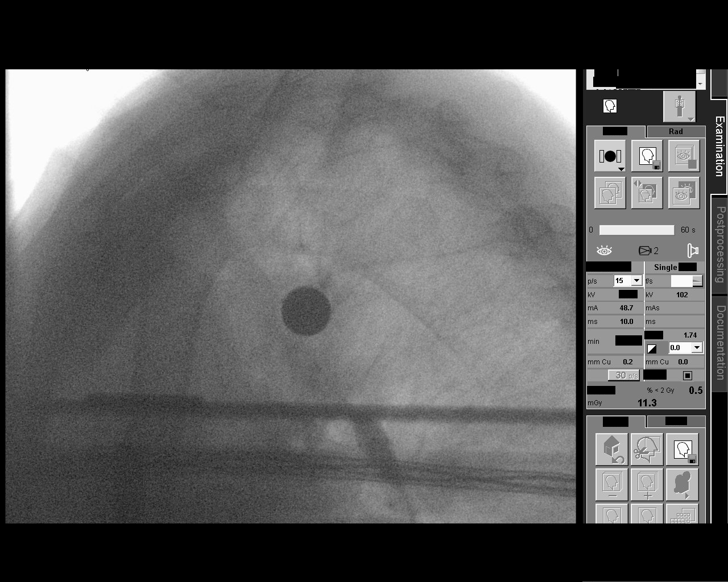

[Series 15: run · 1 of 30 frames shown (11 of 12)]
[frame 26/30]
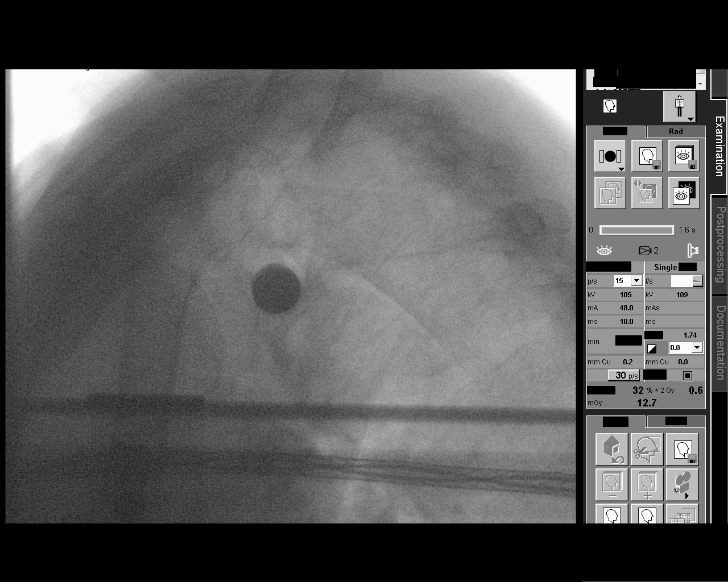

[Series 16: run · 1 of 35 frames shown (12 of 12)]
[frame 30/35]
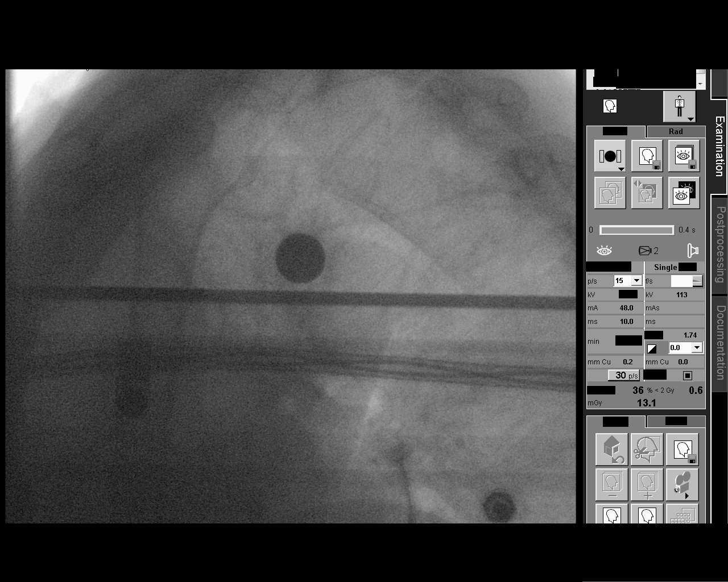

[12 of 24 positions shown; findings below may reference images not displayed]

FINDINGS: Thin liquid- within normal limits

Nectar thick liquid- within normal limits

Almi?Azara within normal limits

Almi?Auad with cracker- within normal limits

Barium tablet - the barium tablet was swallowed and went initially
into the vallecula but was propelled into the proximal cervical
esophagus. It then passed inferiorly to approximately the T4 level
and hung there. Additional sips of barium and water did not propel
it more distally. The patient did not have significant discomfort
and only a vague impression that something was hung in the upper
thoracic esophagus.
IMPRESSION: No obstruction to the passage of the various consistencies of
barium, apple sauce, and Sal cracker. However, the barium tablet
hung in the proximal thoracic esophagus at approximately T4 level.
It was allowed to dissolve.

Please refer to the Speech Pathologists report for complete details
and recommendations.

## 2017-04-21 ENCOUNTER — Ambulatory Visit (INDEPENDENT_AMBULATORY_CARE_PROVIDER_SITE_OTHER): Payer: Medicare Other | Admitting: Vascular Surgery

## 2017-04-21 ENCOUNTER — Encounter (INDEPENDENT_AMBULATORY_CARE_PROVIDER_SITE_OTHER): Payer: Medicare Other

## 2017-05-08 ENCOUNTER — Other Ambulatory Visit: Payer: Self-pay | Admitting: Internal Medicine

## 2017-05-08 DIAGNOSIS — Z1231 Encounter for screening mammogram for malignant neoplasm of breast: Secondary | ICD-10-CM

## 2017-06-05 DIAGNOSIS — I70219 Atherosclerosis of native arteries of extremities with intermittent claudication, unspecified extremity: Secondary | ICD-10-CM | POA: Insufficient documentation

## 2017-06-08 ENCOUNTER — Ambulatory Visit
Admission: RE | Admit: 2017-06-08 | Discharge: 2017-06-08 | Disposition: A | Discharge status: Home / Self Care | Payer: Medicare Other | Source: Ambulatory Visit | Attending: Internal Medicine | Admitting: Internal Medicine

## 2017-06-08 DIAGNOSIS — R928 Other abnormal and inconclusive findings on diagnostic imaging of breast: Secondary | ICD-10-CM | POA: Insufficient documentation

## 2017-06-08 DIAGNOSIS — Z1231 Encounter for screening mammogram for malignant neoplasm of breast: Secondary | ICD-10-CM | POA: Diagnosis not present

## 2017-06-08 DIAGNOSIS — N6489 Other specified disorders of breast: Secondary | ICD-10-CM | POA: Diagnosis not present

## 2017-06-09 ENCOUNTER — Other Ambulatory Visit: Payer: Self-pay | Admitting: Internal Medicine

## 2017-06-09 DIAGNOSIS — R928 Other abnormal and inconclusive findings on diagnostic imaging of breast: Secondary | ICD-10-CM

## 2017-06-09 DIAGNOSIS — N6310 Unspecified lump in the right breast, unspecified quadrant: Secondary | ICD-10-CM

## 2017-06-19 ENCOUNTER — Ambulatory Visit
Admission: RE | Admit: 2017-06-19 | Discharge: 2017-06-19 | Disposition: A | Discharge status: Home / Self Care | Payer: Medicare Other | Source: Ambulatory Visit | Attending: Internal Medicine | Admitting: Internal Medicine

## 2017-06-19 DIAGNOSIS — N6310 Unspecified lump in the right breast, unspecified quadrant: Secondary | ICD-10-CM | POA: Diagnosis not present

## 2017-06-19 DIAGNOSIS — R928 Other abnormal and inconclusive findings on diagnostic imaging of breast: Secondary | ICD-10-CM | POA: Diagnosis present

## 2017-06-20 ENCOUNTER — Other Ambulatory Visit: Payer: Self-pay | Admitting: Internal Medicine

## 2017-06-20 DIAGNOSIS — R928 Other abnormal and inconclusive findings on diagnostic imaging of breast: Secondary | ICD-10-CM

## 2017-06-20 DIAGNOSIS — N6310 Unspecified lump in the right breast, unspecified quadrant: Secondary | ICD-10-CM

## 2017-06-22 ENCOUNTER — Other Ambulatory Visit: Payer: Self-pay | Admitting: Diagnostic Radiology

## 2017-06-22 ENCOUNTER — Ambulatory Visit
Admission: RE | Admit: 2017-06-22 | Discharge: 2017-06-22 | Disposition: A | Discharge status: Home / Self Care | Payer: Medicare Other | Source: Ambulatory Visit | Attending: Internal Medicine | Admitting: Internal Medicine

## 2017-06-22 DIAGNOSIS — R928 Other abnormal and inconclusive findings on diagnostic imaging of breast: Secondary | ICD-10-CM

## 2017-06-22 DIAGNOSIS — N6310 Unspecified lump in the right breast, unspecified quadrant: Secondary | ICD-10-CM

## 2017-06-22 DIAGNOSIS — N6312 Unspecified lump in the right breast, upper inner quadrant: Secondary | ICD-10-CM | POA: Diagnosis not present

## 2017-06-22 DIAGNOSIS — R897 Abnormal histological findings in specimens from other organs, systems and tissues: Secondary | ICD-10-CM

## 2017-06-23 LAB — SURGICAL PATHOLOGY

## 2017-07-04 ENCOUNTER — Other Ambulatory Visit (INDEPENDENT_AMBULATORY_CARE_PROVIDER_SITE_OTHER): Payer: Self-pay | Admitting: Vascular Surgery

## 2017-07-04 DIAGNOSIS — M7989 Other specified soft tissue disorders: Secondary | ICD-10-CM

## 2017-07-06 ENCOUNTER — Ambulatory Visit (INDEPENDENT_AMBULATORY_CARE_PROVIDER_SITE_OTHER): Payer: Medicare Other | Admitting: Vascular Surgery

## 2017-07-06 ENCOUNTER — Encounter (INDEPENDENT_AMBULATORY_CARE_PROVIDER_SITE_OTHER): Payer: Self-pay | Admitting: Vascular Surgery

## 2017-07-06 ENCOUNTER — Ambulatory Visit (INDEPENDENT_AMBULATORY_CARE_PROVIDER_SITE_OTHER): Payer: Medicare Other

## 2017-07-06 VITALS — BP 113/78 | HR 83 | Resp 16 | Ht 62.0 in | Wt 183.0 lb

## 2017-07-06 DIAGNOSIS — I872 Venous insufficiency (chronic) (peripheral): Secondary | ICD-10-CM | POA: Diagnosis not present

## 2017-07-06 DIAGNOSIS — I890 Lymphedema, not elsewhere classified: Secondary | ICD-10-CM | POA: Diagnosis not present

## 2017-07-06 DIAGNOSIS — M7989 Other specified soft tissue disorders: Secondary | ICD-10-CM

## 2017-07-06 DIAGNOSIS — M5136 Other intervertebral disc degeneration, lumbar region: Secondary | ICD-10-CM | POA: Diagnosis not present

## 2017-07-06 NOTE — Progress Notes (Signed)
Subjective:    Patient ID: Sabrina Stanley, female    DOB: August 22, 1961, 56 y.o.   MRN: [id number] Chief Complaint  Patient presents with  . Follow-up    Pt. Conv. BLE vein reflux   Patient presents to review vascular studies.  The patient was last seen in our office on Jan 11, 2017 for evaluation of bilateral lower extremity pain and swelling.  The patient presents today with stable symptoms.  Since our initial visit, the patient has been engaging in conservative therapy including wearing medical grade 1 compression stockings, elevating her legs and remaining active with minimal improvement in her symptoms requiring the use of over-the-counter anti-inflammatories with minimal relief.  Her symptoms have progressed to the point she is unable to function on a daily basis.  Her symptoms are lifestyle limiting.  The patient has been using her lymphedema pump in conjunction with conservative therapy with minimal improvement.  The patient underwent a bilateral lower extremity venous reflux exam which was notable for venous incompetence to the bilateral great saphenous veins.  No evidence of deep or superficial vein thrombophlebitis of the bilateral lower extremity.  The patient denies any fever, nausea or vomiting.   Review of Systems  Constitutional: Negative.   HENT: Negative.   Eyes: Negative.   Respiratory: Negative.   Cardiovascular: Positive for leg swelling.       Lower extremity pain  Gastrointestinal: Negative.   Endocrine: Negative.   Genitourinary: Negative.   Musculoskeletal: Negative.   Skin: Negative.   Allergic/Immunologic: Negative.   Neurological: Negative.   Hematological: Negative.   Psychiatric/Behavioral: Negative.       Objective:   Physical Exam  Constitutional: She is oriented to person, place, and time. She appears well-developed and well-nourished. No distress.  HENT:  Head: Normocephalic and atraumatic.  Eyes: Conjunctivae are normal. Pupils are equal, round, and  reactive to light.  Neck: Normal range of motion.  Cardiovascular: Normal rate, regular rhythm, normal heart sounds and intact distal pulses.  Pulses:      Radial pulses are 2+ on the right side, and 2+ on the left side.  Hard to palpate pedal pulses however her bilateral feet are warm  Pulmonary/Chest: Effort normal and breath sounds normal.  Musculoskeletal: Normal range of motion. She exhibits edema (Bilateral moderate nonpitting edema noted).  Neurological: She is alert and oriented to person, place, and time.  Skin: Skin is warm and dry. She is not diaphoretic.  Less than 1 cm scattered varicosities noted to the bilateral lower extremity.  There is no stasis dermatitis.  There is no skin changes.  There is no cellulitis.  Psychiatric: She has a normal mood and affect. Her behavior is normal. Judgment and thought content normal.  Vitals reviewed.  BP 113/78 (BP Location: Right Arm)   Pulse 83   Resp 16   Ht 5\' 2"  (1.575 m)   Wt 183 lb (83 kg)   BMI 33.47 kg/m   Past Medical History:  Diagnosis Date  . Abnormal mammogram, unspecified   . Alcoholism ([place])   . Allergic state   . Anemia   . Anxiety   . Arthritis   . Bilateral breast cysts   . Bipolar 1 disorder ([place])   . Bipolar 1 disorder ([place])   . Bone marrow disorder   . Bronchitis   . Cervical stenosis of spine   . Cervicalgia   . Chronic diarrhea   . Chronic fatigue disorder   . Chronic pain   .  COPD (chronic obstructive pulmonary disease) ([place])   . Depression   . Depression   . Eczema   . Esophageal reflux   . Fibromyalgia   . GERD (gastroesophageal reflux disease)   . Headache   . Hiatal hernia   . History of esophagogastroduodenoscopy (EGD)   . Hyperlipidemia   . Hyperlipidemia   . Hypertension   . Hypothyroidism   . Insomnia   . Lithium toxicity   . Menstrual irregularity   . Myalgia   . Osteopenia   . Osteopenia   . Panic disorder   . Pleurisy   . Pneumonia   . Psoriasis   . PVD (peripheral  vascular disease) ([place])   . Sleep apnea   . Tobacco abuse   . UTI (urinary tract infection)   . Vitamin D deficiency    Social History   Socioeconomic History  . Marital status: Married    Spouse name: Not on file  . Number of children: Not on file  . Years of education: Not on file  . Highest education level: Not on file  Social Needs  . Financial resource strain: Not on file  . Food insecurity - worry: Not on file  . Food insecurity - inability: Not on file  . Transportation needs - medical: Not on file  . Transportation needs - non-medical: Not on file  Occupational History  . Not on file  Tobacco Use  . Smoking status: Current Every Day Smoker    Packs/day: 1.00    Years: 35.00    Pack years: 35.00    Types: Cigarettes  . Smokeless tobacco: Never Used  Substance and Sexual Activity  . Alcohol use: No    Alcohol/week: 0.0 oz  . Drug use: No  . Sexual activity: Not on file  Other Topics Concern  . Not on file  Social History Narrative  . Not on file   Past Surgical History:  Procedure Laterality Date  . ABDOMINAL HYSTERECTOMY  1996  . BREAST BIOPSY Right 08-21-12   neg  . COLONOSCOPY    . [place]  . ESOPHAGOGASTRODUODENOSCOPY    . EYE SURGERY  2010  . KNEE SURGERY Right 1998   meniscus repair  . NASAL SINUS SURGERY  1998, 2000  . NECK SURGERY     x 2  . OOPHORECTOMY    . SPINE SURGERY  2006, 2008   cervical fusion  . TONSILLECTOMY     Family History  Problem Relation Age of Onset  . Lung cancer Father   . Cancer Father   . Depression Mother   . Hypertension Mother   . Breast cancer Maternal Aunt        great aunt  . Lung cancer Paternal Uncle   . Cervical cancer Maternal Grandmother    Allergies  Allergen Reactions  . Asa [Aspirin]     Stomach pain  . Darvocet [Propoxyphene N-Acetaminophen]     Stomach pain  . Effexor [Venlafaxine Hcl] Other (See Comments)    hallucinations  . Neurontin [Gabapentin]       fatigue  . Nsaids     Bad stomach ache   . Mobic [Meloxicam] Palpitations  . Penicillins Rash      Assessment & Plan:  Patient presents to review vascular studies.  The patient was last seen in our office on Jan 11, 2017 for evaluation of bilateral lower extremity pain and swelling.  The patient presents today with stable symptoms.  Since our initial visit, the patient has been engaging in conservative therapy including wearing medical grade 1 compression stockings, elevating her legs and remaining active with minimal improvement in her symptoms requiring the use of over-the-counter anti-inflammatories with minimal relief.  Her symptoms have progressed to the point she is unable to function on a daily basis.  Her symptoms are lifestyle limiting.  The patient has been using her lymphedema pump in conjunction with conservative therapy with minimal improvement.  The patient underwent a bilateral lower extremity venous reflux exam which was notable for venous incompetence to the bilateral great saphenous veins.  No evidence of deep or superficial vein thrombophlebitis of the bilateral lower extremity.  The patient denies any fever, nausea or vomiting.  1. Chronic venous insufficiency - New Patient with venous reflux to the bilateral great saphenous veins on duplex The patient has engaged in conservative therapy including wearing medical grade 1 compression stockings, elevating her legs and remaining active and using her lymphedema pump with minimal improvement to her symptoms requiring the use of over-the-counter anti-inflammatories with minimal relief. The patient's symptoms have progressed to the point she is unable to function on a daily basis The patient is likely to benefit from endovenous laser ablation. I have discussed the risks and benefits of the procedure. The risks primarily include DVT, recanalization, bleeding, infection, and inability to gain access. The patient is in agreement I will  applied to her insurance Our office will call her with insurance approval  2. Lymphedema - Stable Patient was encouraged to continue to engage in conservative therapy Patient to continue to use her lymphedema pump at least twice a day for an hour each time Patient is in agreement  3. DDD (degenerative disc disease), lumbar - Stable This is a contributing factor to the patient's lower extremity pain. She understands by undergoing a bilateral endovenous laser ablation her lower extremity pain may not be completely resolved due to her other medical issues such as DDD. Patient expresses her understanding  Current Outpatient Medications on File Prior to Visit  Medication Sig Dispense Refill  . ABILIFY 5 MG tablet 5 mg daily.     . Biotin 5 MG CAPS Take 1,000 mg by mouth daily.    . CRESTOR 40 MG tablet Take 40 mg by mouth daily. Reported on 01/26/2016    . divalproex (DEPAKOTE ER) 250 MG 24 hr tablet Take 250 mg by mouth daily. 3 tablets at bedtime    . FLUoxetine (PROZAC) 20 MG capsule Take 60 mg by mouth daily.     . Fluticasone-Salmeterol (ADVAIR) 100-50 MCG/DOSE AEPB Inhale 1 puff into the lungs daily as needed.    Marland Kitchen guaiFENesin-codeine (ROBITUSSIN AC) 100-10 MG/5ML syrup Take 5 mLs by mouth 4 (four) times daily as needed for cough.    . hydrOXYzine (ATARAX/VISTARIL) 50 MG tablet Take 50 mg by mouth every 6 (six) hours as needed. Reported on 09/03/2015    . levofloxacin (LEVAQUIN) 500 MG tablet Take 500 mg by mouth daily. Reported on 01/26/2016    . metoCLOPramide (REGLAN) 5 MG tablet Take 5 mg by mouth 2 (two) times daily. Reported on 01/26/2016    . morphine (MS CONTIN) 15 MG 12 hr tablet Limit 2-3 tabs by mouth per day if tolerated 90 tablet 0  . omeprazole (PRILOSEC) 40 MG capsule Take 40 mg by mouth daily.    Marland Kitchen oxyCODONE-acetaminophen (PERCOCET/ROXICET) 5-325 MG tablet Limit 2-4 tabs per day if tolerated for breakthrough pain if tolerated while taking MS  Contin 120 tablet 0  . potassium  chloride SA (K-DUR,KLOR-CON) 20 MEQ tablet Take 20 mEq by mouth daily.    . primidone (MYSOLINE) 50 MG tablet Take 50 mg by mouth 2 (two) times daily.    Marland Kitchen rOPINIRole (REQUIP) 1 MG tablet Take 1 mg by mouth at bedtime.     . SUMAtriptan (IMITREX) 100 MG tablet Take 100 mg by mouth every 2 (two) hours as needed for migraine.    Marland Kitchen tiZANidine (ZANAFLEX) 4 MG capsule Take 4 mg by mouth at bedtime.     . topiramate (TOPAMAX) 100 MG tablet Take 100 mg by mouth 2 (two) times daily.    Marland Kitchen torsemide (DEMADEX) 20 MG tablet Take 20 mg by mouth 2 (two) times daily.    Marland Kitchen zolpidem (AMBIEN) 10 MG tablet Take 10 mg by mouth daily.     . ciprofloxacin (CIPRO) 250 MG tablet Take 1 tablet (250 mg total) by mouth 2 (two) times daily. (Patient not taking: Reported on 04/20/2016) 14 tablet 0   Current Facility-Administered Medications on File Prior to Visit  Medication Dose Route Frequency Provider Last Rate Last Dose  . bupivacaine (PF) (MARCAINE) 0.25 % injection 30 mL  30 mL Other Once Mohammed Kindle, MD      . ciprofloxacin (CIPRO) IVPB 400 mg  400 mg Intravenous Once Mohammed Kindle, MD      . fentaNYL (SUBLIMAZE) injection 100 mcg  100 mcg Intravenous Once Mohammed Kindle, MD      . lidocaine (PF) (XYLOCAINE) 1 % injection 10 mL  10 mL Subcutaneous Once Mohammed Kindle, MD      . midazolam (VERSED) 5 MG/5ML injection 5 mg  5 mg Intravenous Once Mohammed Kindle, MD      . orphenadrine (NORFLEX) injection 60 mg  60 mg Intramuscular Once Mohammed Kindle, MD       There are no Patient Instructions on file for this visit. No Follow-up on file.  Kensleigh Gates A Kelin Borum, PA-C

## 2017-08-15 ENCOUNTER — Other Ambulatory Visit (INDEPENDENT_AMBULATORY_CARE_PROVIDER_SITE_OTHER): Payer: Medicare Other | Admitting: Vascular Surgery

## 2017-08-18 ENCOUNTER — Encounter (INDEPENDENT_AMBULATORY_CARE_PROVIDER_SITE_OTHER): Payer: Medicare Other

## 2017-09-08 ENCOUNTER — Ambulatory Visit (INDEPENDENT_AMBULATORY_CARE_PROVIDER_SITE_OTHER): Payer: Medicare Other | Admitting: Vascular Surgery

## 2017-09-08 ENCOUNTER — Encounter (INDEPENDENT_AMBULATORY_CARE_PROVIDER_SITE_OTHER): Payer: Self-pay | Admitting: Vascular Surgery

## 2017-09-08 VITALS — BP 118/72 | HR 86 | Resp 17 | Wt 185.0 lb

## 2017-09-08 DIAGNOSIS — I872 Venous insufficiency (chronic) (peripheral): Secondary | ICD-10-CM

## 2017-09-08 NOTE — Progress Notes (Signed)
Chronic venous insufficiency     The patient's left lower extremity was sterilely prepped and draped. The ultrasound machine was used to visualize the saphenous vein throughout its course. A segment just below the knee was selected for access. The saphenous vein was accessed without difficulty using ultrasound guidance with a micro puncture needle. A micro puncture wire and sheath were then placed. A 0.018 wire was placed beyond the saphenofemoral junction through the sheath and the micro puncture sheath was removed. The 65 cm sheath was then placed over the wire and the wire and dilator were removed. The laser fiber was placed through the sheath and its tip was placed approximately 5 cm below the saphenofemoral junction. Tumescent anesthesia was then created with a dilute lidocaine solution. Laser energy was then delivered with constant withdrawal of the sheath and laser fiber. Approximately 1273 Joules of energy were delivered over a length of 33 cm using the 1470 Hz Vena[employer]ure machine at [employer]. Sterile dressings were placed. The patient tolerated the procedure well without complications.

## 2017-09-11 ENCOUNTER — Ambulatory Visit (INDEPENDENT_AMBULATORY_CARE_PROVIDER_SITE_OTHER): Payer: Medicare Other

## 2017-09-11 DIAGNOSIS — I872 Venous insufficiency (chronic) (peripheral): Secondary | ICD-10-CM

## 2017-09-26 ENCOUNTER — Other Ambulatory Visit (INDEPENDENT_AMBULATORY_CARE_PROVIDER_SITE_OTHER): Payer: Medicare Other | Admitting: Vascular Surgery

## 2017-09-29 ENCOUNTER — Encounter (INDEPENDENT_AMBULATORY_CARE_PROVIDER_SITE_OTHER): Payer: Medicare Other

## 2018-02-20 DIAGNOSIS — G4730 Sleep apnea, unspecified: Secondary | ICD-10-CM | POA: Insufficient documentation

## 2018-02-20 DIAGNOSIS — G251 Drug-induced tremor: Secondary | ICD-10-CM | POA: Insufficient documentation

## 2018-02-20 DIAGNOSIS — G608 Other hereditary and idiopathic neuropathies: Secondary | ICD-10-CM | POA: Insufficient documentation

## 2018-03-20 ENCOUNTER — Other Ambulatory Visit: Payer: Self-pay

## 2018-03-20 ENCOUNTER — Ambulatory Visit
Admission: EM | Admit: 2018-03-20 | Discharge: 2018-03-20 | Disposition: A | Discharge status: Home / Self Care | Payer: Medicare Other | Attending: Family Medicine | Admitting: Family Medicine

## 2018-03-20 ENCOUNTER — Ambulatory Visit (INDEPENDENT_AMBULATORY_CARE_PROVIDER_SITE_OTHER): Payer: Medicare Other

## 2018-03-20 ENCOUNTER — Encounter: Payer: Self-pay | Admitting: Emergency Medicine

## 2018-03-20 DIAGNOSIS — S52512A Displaced fracture of left radial styloid process, initial encounter for closed fracture: Secondary | ICD-10-CM | POA: Diagnosis not present

## 2018-03-20 DIAGNOSIS — W19XXXA Unspecified fall, initial encounter: Secondary | ICD-10-CM

## 2018-03-20 DIAGNOSIS — M25532 Pain in left wrist: Secondary | ICD-10-CM

## 2018-03-20 NOTE — ED Triage Notes (Signed)
Patient states she fell last, night tripped over a rug injuring her left wrist

## 2018-03-20 NOTE — Discharge Instructions (Signed)
Elevate above your heart most the time to control swelling and pain.Apply ice 20 minutes out of every 2 hours 4-5 times daily for comfort.  Use Tylenol 500 mg combined with 200 to 400 mg of ibuprofen every 6 hours for pain

## 2018-03-20 NOTE — ED Provider Notes (Signed)
[facility]    CSN: [id number] Arrival date & time: 03/20/18  1346     History   Chief Complaint Chief Complaint  Patient presents with  . Wrist Injury    HPI Sabrina Stanley is a 57 y.o. female.   HPI  57 year old female states that last night she tripped over her rug and fell trying to brace her fall with outstretched left hand.  She felt a pop in her wrist and this morning has had more pain and swelling.  Patient is in a pain contract with Dr. Primus Bravo.  She states that she has morphine pills left but has no more Percocets left.  Told her that I will not provide any narcotics because of her contract.         Past Medical History:  Diagnosis Date  . Abnormal mammogram, unspecified   . Alcoholism ([place])   . Allergic state   . Anemia   . Anxiety   . Arthritis   . Bilateral breast cysts   . Bipolar 1 disorder ([place])   . Bipolar 1 disorder ([place])   . Bone marrow disorder   . Bronchitis   . Cervical stenosis of spine   . Cervicalgia   . Chronic diarrhea   . Chronic fatigue disorder   . Chronic pain   . COPD (chronic obstructive pulmonary disease) ([place])   . Depression   . Depression   . Eczema   . Esophageal reflux   . Fibromyalgia   . GERD (gastroesophageal reflux disease)   . Headache   . Hiatal hernia   . History of esophagogastroduodenoscopy (EGD)   . Hyperlipidemia   . Hyperlipidemia   . Hypertension   . Hypothyroidism   . Insomnia   . Lithium toxicity   . Menstrual irregularity   . Myalgia   . Osteopenia   . Osteopenia   . Panic disorder   . Pleurisy   . Pneumonia   . Psoriasis   . PVD (peripheral vascular disease) ([place])   . Sleep apnea   . Tobacco abuse   . UTI (urinary tract infection)   . Vitamin D deficiency     Patient Active Problem List   Diagnosis Date Noted  . Chronic venous insufficiency 07/06/2017  . Lymphedema 01/11/2017  . Lower extremity edema 03/22/2016  . Ulnar neuropathy 05/14/2015  . DDD (degenerative disc  disease), lumbar 02/11/2015  . Facet syndrome, lumbar 02/11/2015  . Status post cervical spinal fusion 02/11/2015  . Sacroiliac joint dysfunction 02/11/2015    Past Surgical History:  Procedure Laterality Date  . ABDOMINAL HYSTERECTOMY  1996  . BREAST BIOPSY Right 08-21-12   neg  . COLONOSCOPY    . [place]  . ESOPHAGOGASTRODUODENOSCOPY    . ESOPHAGOGASTRODUODENOSCOPY N/A 09/17/2015   Procedure: ESOPHAGOGASTRODUODENOSCOPY (EGD);  Surgeon: Hulen Luster, MD;  [facility];  Service: Gastroenterology;  Laterality: N/A;  . EYE SURGERY  2010  . KNEE SURGERY Right 1998   meniscus repair  . NASAL SINUS SURGERY  1998, 2000  . NECK SURGERY     x 2  . OOPHORECTOMY    . SPINE SURGERY  2006, 2008   cervical fusion  . TONSILLECTOMY      OB History    Gravida  0   Para  0   Term  0   Preterm  0   AB  0   Living  0     SAB  0   TAB  0   Ectopic  0   Multiple  0   Live Births           Obstetric Comments  Menstrual: 12         Home Medications    Prior to Admission medications   Medication Sig Start Date End Date Taking? Authorizing Provider  ABILIFY 5 MG tablet 5 mg daily.  01/30/13  Yes [provider]  atorvastatin (LIPITOR) 10 MG tablet  08/02/17  Yes [provider]  Biotin 5 MG CAPS Take 1,000 mg by mouth daily.   Yes [provider]  cyanocobalamin (,VITAMIN B-12,) 1000 MCG/ML injection Inject into the muscle. 12/14/15  Yes [provider]  diclofenac sodium (VOLTAREN) 1 % GEL  07/14/17  Yes [provider]  divalproex (DEPAKOTE ER) 500 MG 24 hr tablet  06/26/17  Yes [provider]  FLUoxetine (PROZAC) 20 MG capsule Take 60 mg by mouth daily.    Yes [provider]  Fluticasone-Salmeterol (ADVAIR) 100-50 MCG/DOSE AEPB Inhale 1 puff into the lungs daily as needed.   Yes [provider]  levofloxacin (LEVAQUIN) 500 MG tablet Take 500 mg by  mouth daily. Reported on 01/26/2016   Yes [provider]  morphine (MS CONTIN) 15 MG 12 hr tablet Limit 2-3 tabs by mouth per day if tolerated 04/20/16  Yes Mohammed Kindle, MD  oxyCODONE-acetaminophen (PERCOCET/ROXICET) 5-325 MG tablet Limit 2-4 tabs per day if tolerated for breakthrough pain if tolerated while taking MS Contin 04/20/16  Yes Mohammed Kindle, MD  potassium chloride SA (K-DUR,KLOR-CON) 20 MEQ tablet Take 20 mEq by mouth daily.   Yes [provider]  primidone (MYSOLINE) 50 MG tablet Take 50 mg by mouth 2 (two) times daily.   Yes [provider]  rOPINIRole (REQUIP) 1 MG tablet Take 1 mg by mouth at bedtime.    Yes [provider]  SUMAtriptan (IMITREX) 100 MG tablet Take 100 mg by mouth every 2 (two) hours as needed for migraine.   Yes [provider]  tiZANidine (ZANAFLEX) 4 MG capsule Take 4 mg by mouth at bedtime.    Yes [provider]  topiramate (TOPAMAX) 100 MG tablet Take 100 mg by mouth 2 (two) times daily.   Yes [provider]  torsemide (DEMADEX) 20 MG tablet Take 20 mg by mouth 2 (two) times daily.   Yes [provider]  Vitamin D, Ergocalciferol, (DRISDOL) 50000 units CAPS capsule  08/28/17  Yes [provider]  zolpidem (AMBIEN) 10 MG tablet Take 10 mg by mouth daily.  02/12/13  Yes [provider]  guaiFENesin-codeine (ROBITUSSIN AC) 100-10 MG/5ML syrup Take 5 mLs by mouth 4 (four) times daily as needed for cough.    [provider]  hydrOXYzine (ATARAX/VISTARIL) 50 MG tablet Take 50 mg by mouth every 6 (six) hours as needed. Reported on 09/03/2015    [provider]  metoCLOPramide (REGLAN) 5 MG tablet Take 5 mg by mouth 2 (two) times daily. Reported on 01/26/2016    [provider]    Family History Family History  Problem Relation Age of Onset  . Lung cancer Father   . Cancer Father   . Depression Mother   . Hypertension Mother   . Breast cancer  Maternal Aunt        great aunt  . Lung cancer Paternal Uncle   . Cervical cancer Maternal Grandmother     Social History Social History  Tobacco Use  . Smoking status: Current Every Day Smoker    Packs/day: 1.00    Years: 35.00    Pack years: 35.00    Types: Cigarettes  . Smokeless tobacco: Never Used  Substance Use Topics  . Alcohol use: No    Alcohol/week: 0.0 oz  . Drug use: No     Allergies   Asa [aspirin]; Codeine; Cyclobenzaprine; Darvocet [propoxyphene n-acetaminophen]; Effexor [venlafaxine hcl]; Erythromycin; Meperidine-promethazine; Neurontin [gabapentin]; Nsaids; Propoxyphene; Mobic [meloxicam]; and Penicillins   Review of Systems Review of Systems  Constitutional: Positive for activity change. Negative for appetite change, chills, fatigue and fever.  Musculoskeletal: Positive for arthralgias, joint swelling and myalgias.  All other systems reviewed and are negative.    Physical Exam Triage Vital Signs ED Triage Vitals  Enc Vitals Group     BP 03/20/18 1410 123/78     Pulse Rate 03/20/18 1410 72     Resp 03/20/18 1410 16     Temp 03/20/18 1410 98.6 F (37 C)     Temp src --      SpO2 03/20/18 1410 98 %     Weight 03/20/18 1404 174 lb (78.9 kg)     Height 03/20/18 1404 5\' 2"  (1.575 m)     Head Circumference --      Peak Flow --      Pain Score 03/20/18 1404 7     Pain Loc --      Pain Edu? --      Excl. in [place]? --    No data found.  Updated Vital Signs BP 123/78   Pulse 72   Temp 98.6 F (37 C)   Resp 16   Ht 5\' 2"  (1.575 m)   Wt 174 lb (78.9 kg)   SpO2 98%   BMI 31.83 kg/m   Visual Acuity Right Eye Distance:   Left Eye Distance:   Bilateral Distance:    Right Eye Near:   Left Eye Near:    Bilateral Near:     Physical Exam  Constitutional: She is oriented to person, place, and time. She appears well-developed and well-nourished. No distress.  HENT:  Head: Normocephalic.  Eyes: Pupils are equal, round, and reactive to light.  Right eye exhibits no discharge. Left eye exhibits no discharge.  Neck: Normal range of motion.  Musculoskeletal: She exhibits edema and tenderness.  Emanation of the left nondominant wrist shows a swelling in the snuffbox.  She has decreased range of motion due to pain.  Pain is over the distal radial styloid and snuffbox.  Sensation and circulation are intact distally.  Neurological: She is alert and oriented to person, place, and time.  Skin: Skin is warm and dry. She is not diaphoretic.  Psychiatric: She has a normal mood and affect. Her behavior is normal. Judgment and thought content normal.  Nursing note and vitals reviewed.    UC Treatments / Results  Labs (all labs ordered are listed, but only abnormal results are displayed) Labs Reviewed - No data to display  EKG None  Radiology Dg Wrist Complete Left  Addendum Date: 03/20/2018   ADDENDUM REPORT: 03/20/2018 14:56 ADDENDUM: Under the clinical data line above I misspoke stating "right" wrist when in fact is the left wrist that is symptomatic. Electronically Signed   By: David  Martinique M.D.   On: 03/20/2018 14:56   Result Date: 03/20/2018 CLINICAL DATA:  [place] last night with persistent right left wrist pain and swelling EXAM: LEFT WRIST - COMPLETE 3+  VIEW COMPARISON:  Report of an MRI of the left wrist of Jan 26, 2010 FINDINGS: Patient has sustained an acute fracture through the radial styloid. There is mild distraction of the fracture fragments. The fracture involves the radiocarpal joint space. The distal ulna is intact. The carpal bones exhibit no acute fractures. There is mild degenerative change of the first [facility] joint. IMPRESSION: The patient has sustained an acute fracture involving the radial styloid. The fracture line reaches the radiocarpal joint surface. Electronically Signed: By: David  Martinique M.D. On: 03/20/2018 14:34    Procedures Procedures (including critical care time)  Medications Ordered in UC Medications - No  data to display  Initial Impression / Assessment and Plan / UC Course  I have reviewed the triage vital signs and the nursing notes.  Pertinent labs & imaging results that were available during my care of the patient were reviewed by me and considered in my medical decision making (see chart for details).     Plan: 1. Test/x-ray results and diagnosis reviewed with patient 2. rx as per orders; risks, benefits, potential side effects reviewed with patient 3. Recommend supportive treatment with ice and elevation.  Place patient in a sugar tong splint.  Will refer to orthopedics because of the [facility] disruption with the styloid fracture.  Concerning is the tenderness in the snuffbox.  Radiographic evidence of injury is not present.    She will make the appointment with [facility] for evaluation as soon as possible. 4. F/u prn if symptoms worsen or don't improve  Final Clinical Impressions(s) / UC Diagnoses   Final diagnoses:  Displaced fracture of left radial styloid process, initial encounter for closed fracture     Discharge Instructions     Elevate above your heart most the time to control swelling and pain.Apply ice 20 minutes out of every 2 hours 4-5 times daily for comfort.  Use Tylenol 500 mg combined with 200 to 400 mg of ibuprofen every 6 hours for pain   ED Prescriptions    None     Controlled Substance Prescriptions [place] Controlled Substance Registry consulted? Not Applicable   Lorin Picket, PA-C 03/20/18 1512

## 2018-03-21 ENCOUNTER — Ambulatory Visit: Payer: Medicare Other | Admitting: Podiatry

## 2018-06-11 ENCOUNTER — Ambulatory Visit (INDEPENDENT_AMBULATORY_CARE_PROVIDER_SITE_OTHER): Payer: Medicare Other | Admitting: Podiatry

## 2018-06-11 ENCOUNTER — Encounter: Payer: Self-pay | Admitting: Podiatry

## 2018-06-11 VITALS — BP 129/80 | HR 93 | Resp 16

## 2018-06-11 DIAGNOSIS — E039 Hypothyroidism, unspecified: Secondary | ICD-10-CM | POA: Insufficient documentation

## 2018-06-11 DIAGNOSIS — J449 Chronic obstructive pulmonary disease, unspecified: Secondary | ICD-10-CM | POA: Insufficient documentation

## 2018-06-11 DIAGNOSIS — F410 Panic disorder [episodic paroxysmal anxiety] without agoraphobia: Secondary | ICD-10-CM | POA: Insufficient documentation

## 2018-06-11 DIAGNOSIS — M4802 Spinal stenosis, cervical region: Secondary | ICD-10-CM | POA: Insufficient documentation

## 2018-06-11 DIAGNOSIS — D649 Anemia, unspecified: Secondary | ICD-10-CM | POA: Insufficient documentation

## 2018-06-11 DIAGNOSIS — F329 Major depressive disorder, single episode, unspecified: Secondary | ICD-10-CM | POA: Insufficient documentation

## 2018-06-11 DIAGNOSIS — E782 Mixed hyperlipidemia: Secondary | ICD-10-CM | POA: Insufficient documentation

## 2018-06-11 DIAGNOSIS — G4700 Insomnia, unspecified: Secondary | ICD-10-CM | POA: Insufficient documentation

## 2018-06-11 DIAGNOSIS — K219 Gastro-esophageal reflux disease without esophagitis: Secondary | ICD-10-CM | POA: Insufficient documentation

## 2018-06-11 DIAGNOSIS — G9332 Myalgic encephalomyelitis/chronic fatigue syndrome: Secondary | ICD-10-CM | POA: Insufficient documentation

## 2018-06-11 DIAGNOSIS — M79676 Pain in unspecified toe(s): Secondary | ICD-10-CM | POA: Diagnosis not present

## 2018-06-11 DIAGNOSIS — F32A Depression, unspecified: Secondary | ICD-10-CM | POA: Insufficient documentation

## 2018-06-11 DIAGNOSIS — R5382 Chronic fatigue, unspecified: Secondary | ICD-10-CM | POA: Insufficient documentation

## 2018-06-11 DIAGNOSIS — G43719 Chronic migraine without aura, intractable, without status migrainosus: Secondary | ICD-10-CM | POA: Insufficient documentation

## 2018-06-11 DIAGNOSIS — E559 Vitamin D deficiency, unspecified: Secondary | ICD-10-CM | POA: Insufficient documentation

## 2018-06-11 DIAGNOSIS — B351 Tinea unguium: Secondary | ICD-10-CM | POA: Diagnosis not present

## 2018-06-11 DIAGNOSIS — M8580 Other specified disorders of bone density and structure, unspecified site: Secondary | ICD-10-CM | POA: Insufficient documentation

## 2018-06-11 DIAGNOSIS — M791 Myalgia, unspecified site: Secondary | ICD-10-CM | POA: Insufficient documentation

## 2018-06-11 DIAGNOSIS — N926 Irregular menstruation, unspecified: Secondary | ICD-10-CM | POA: Insufficient documentation

## 2018-06-11 DIAGNOSIS — I10 Essential (primary) hypertension: Secondary | ICD-10-CM | POA: Insufficient documentation

## 2018-06-11 DIAGNOSIS — J40 Bronchitis, not specified as acute or chronic: Secondary | ICD-10-CM | POA: Insufficient documentation

## 2018-06-11 DIAGNOSIS — L409 Psoriasis, unspecified: Secondary | ICD-10-CM | POA: Insufficient documentation

## 2018-06-11 DIAGNOSIS — F319 Bipolar disorder, unspecified: Secondary | ICD-10-CM | POA: Insufficient documentation

## 2018-06-11 DIAGNOSIS — L309 Dermatitis, unspecified: Secondary | ICD-10-CM | POA: Insufficient documentation

## 2018-06-11 DIAGNOSIS — G8929 Other chronic pain: Secondary | ICD-10-CM | POA: Insufficient documentation

## 2018-06-11 DIAGNOSIS — M542 Cervicalgia: Secondary | ICD-10-CM | POA: Insufficient documentation

## 2018-06-11 DIAGNOSIS — D759 Disease of blood and blood-forming organs, unspecified: Secondary | ICD-10-CM | POA: Insufficient documentation

## 2018-06-11 DIAGNOSIS — N6002 Solitary cyst of left breast: Secondary | ICD-10-CM

## 2018-06-11 DIAGNOSIS — Q828 Other specified congenital malformations of skin: Secondary | ICD-10-CM | POA: Diagnosis not present

## 2018-06-11 DIAGNOSIS — K529 Noninfective gastroenteritis and colitis, unspecified: Secondary | ICD-10-CM | POA: Insufficient documentation

## 2018-06-11 DIAGNOSIS — N6001 Solitary cyst of right breast: Secondary | ICD-10-CM | POA: Insufficient documentation

## 2018-06-11 DIAGNOSIS — Z720 Tobacco use: Secondary | ICD-10-CM | POA: Insufficient documentation

## 2018-06-11 DIAGNOSIS — T56891A Toxic effect of other metals, accidental (unintentional), initial encounter: Secondary | ICD-10-CM | POA: Insufficient documentation

## 2018-06-11 NOTE — Progress Notes (Signed)
Subjective:  Patient ID: Sabrina Stanley, female    DOB: 22-Apr-1961,  MRN: [id number] HPI Chief Complaint  Patient presents with  . Nail Problem    Toenails bilateral - thick and discolored x years, tried lamisil in the past, helped but did have some GI upset and never finished treatment  . Callouses    Multiple callused areas bilateral - crack and get real tender, tries to keep filed down  . New Patient (Initial Visit)    57 y.o. female presents with the above complaint.   ROS: Denies fever chills nausea vomiting muscle aches pains calf pain back pain chest pain shortness of breath.  Has tried Lamisil in the past for her toenails causes an upset stomach and is unable to take antifungals.  Past Medical History:  Diagnosis Date  . Abnormal mammogram, unspecified   . Alcoholism ([place])   . Allergic state   . Anemia   . Anxiety   . Arthritis   . Bilateral breast cysts   . Bipolar 1 disorder ([place])   . Bipolar 1 disorder ([place])   . Bone marrow disorder   . Bronchitis   . Cervical stenosis of spine   . Cervicalgia   . Chronic diarrhea   . Chronic fatigue disorder   . Chronic pain   . COPD (chronic obstructive pulmonary disease) ([place])   . Depression   . Depression   . Eczema   . Esophageal reflux   . Fibromyalgia   . GERD (gastroesophageal reflux disease)   . Headache   . Hiatal hernia   . History of esophagogastroduodenoscopy (EGD)   . Hyperlipidemia   . Hyperlipidemia   . Hypertension   . Hypothyroidism   . Insomnia   . Lithium toxicity   . Menstrual irregularity   . Myalgia   . Osteopenia   . Osteopenia   . Panic disorder   . Pleurisy   . Pneumonia   . Psoriasis   . PVD (peripheral vascular disease) ([place])   . Sleep apnea   . Tobacco abuse   . UTI (urinary tract infection)   . Vitamin D deficiency    Past Surgical History:  Procedure Laterality Date  . ABDOMINAL HYSTERECTOMY  1996  . BREAST BIOPSY Right 08-21-12   neg  . COLONOSCOPY    . [place]  . ESOPHAGOGASTRODUODENOSCOPY    . ESOPHAGOGASTRODUODENOSCOPY N/A 09/17/2015   Procedure: ESOPHAGOGASTRODUODENOSCOPY (EGD);  Surgeon: Hulen Luster, MD;  Location: [facility] ENDOSCOPY;  Service: Gastroenterology;  Laterality: N/A;  . EYE SURGERY  2010  . KNEE SURGERY Right 1998   meniscus repair  . NASAL SINUS SURGERY  1998, 2000  . NECK SURGERY     x 2  . OOPHORECTOMY    . SPINE SURGERY  2006, 2008   cervical fusion  . TONSILLECTOMY      Current Outpatient Medications:  .  ABILIFY 5 MG tablet, 5 mg daily. , Disp: , Rfl:  .  atorvastatin (LIPITOR) 10 MG tablet, , Disp: , Rfl:  .  Biotin 5 MG CAPS, Take 1,000 mg by mouth daily., Disp: , Rfl:  .  cyanocobalamin (,VITAMIN B-12,) 1000 MCG/ML injection, Inject into the muscle., Disp: , Rfl:  .  divalproex (DEPAKOTE ER) 500 MG 24 hr tablet, , Disp: , Rfl:  .  FLUoxetine (PROZAC) 20 MG capsule, Take 60 mg by mouth daily. , Disp: , Rfl:  .  Fluticasone-Salmeterol (ADVAIR) 100-50 MCG/DOSE AEPB, Inhale 1  puff into the lungs daily as needed., Disp: , Rfl:  .  Meth-Hyo-M Bl-Na Phos-Ph Sal (URIBEL) 118 MG CAPS, Take by mouth., Disp: , Rfl:  .  morphine (MS CONTIN) 15 MG 12 hr tablet, Limit 2-3 tabs by mouth per day if tolerated, Disp: 90 tablet, Rfl: 0 .  omeprazole (PRILOSEC) 40 MG capsule, Take 40 mg by mouth daily., Disp: , Rfl:  .  oxyCODONE-acetaminophen (PERCOCET) 7.5-325 MG tablet, Take 1 tablet by mouth every 4 (four) hours as needed for severe pain., Disp: , Rfl:  .  potassium chloride SA (K-DUR,KLOR-CON) 20 MEQ tablet, Take 20 mEq by mouth daily., Disp: , Rfl:  .  primidone (MYSOLINE) 50 MG tablet, Take 50 mg by mouth 2 (two) times daily., Disp: , Rfl:  .  ranitidine (ZANTAC) 300 MG tablet, Take 300 mg by mouth at bedtime., Disp: , Rfl:  .  rOPINIRole (REQUIP) 1 MG tablet, Take 1 mg by mouth at bedtime. , Disp: , Rfl:  .  SUMAtriptan (IMITREX) 100 MG tablet, Take 100 mg by mouth every 2 (two) hours as needed for  migraine., Disp: , Rfl:  .  topiramate (TOPAMAX) 100 MG tablet, Take 100 mg by mouth 2 (two) times daily., Disp: , Rfl:  .  vitamin C (ASCORBIC ACID) 500 MG tablet, Take 500 mg by mouth daily., Disp: , Rfl:  .  Vitamin D, Ergocalciferol, (DRISDOL) 50000 units CAPS capsule, , Disp: , Rfl:  .  zolpidem (AMBIEN) 10 MG tablet, Take 10 mg by mouth daily. , Disp: , Rfl:   Current Facility-Administered Medications:  .  lidocaine (PF) (XYLOCAINE) 1 % injection 10 mL, 10 mL, Subcutaneous, Once, Mohammed Kindle, MD .  midazolam (VERSED) 5 MG/5ML injection 5 mg, 5 mg, Intravenous, Once, Mohammed Kindle, MD .  orphenadrine (NORFLEX) injection 60 mg, 60 mg, Intramuscular, Once, Mohammed Kindle, MD  Allergies  Allergen Reactions  . Asa [Aspirin]     Stomach pain  . Codeine     Other reaction(s): Diarrhea and vomiting (finding)  . Cyclobenzaprine Other (See Comments)    Other reaction(s): Altered mental status (finding), Unknown  . Darvocet [Propoxyphene N-Acetaminophen]     Stomach pain  . Effexor [Venlafaxine Hcl] Other (See Comments)    hallucinations  . Erythromycin Other (See Comments)    Other reaction(s): Distress (finding)  . Meperidine-Promethazine     Other reaction(s): Other (qualifier value) Causes internal itching  . Neurontin [Gabapentin]     fatigue  . Nsaids     Bad stomach ache   . Propoxyphene     Other reaction(s): Other (qualifier value) causes constipation  . Mobic [Meloxicam] Palpitations  . Penicillins Rash   Review of Systems Objective:   Vitals:   06/11/18 0839  BP: 129/80  Pulse: 93  Resp: 16    General: Well developed, nourished, in no acute distress, alert and oriented x3   Dermatological: Skin is warm, dry and supple bilateral. Nails x 10 are well maintained; remaining integument appears unremarkable at this time. There are no open sores, no preulcerative lesions, no rash or signs of infection present.  Thick fissured skin particular on the forefoot and  heels.  No open lesions or wounds.  Toenails appear to be dystrophic possibly mycotic.  Vascular: Dorsalis Pedis artery and Posterior Tibial artery pedal pulses are 2/4 bilateral with immedate capillary fill time. Pedal hair growth present. No varicosities and no lower extremity edema present bilateral.   Neruologic: Grossly intact via light touch bilateral. Vibratory intact  via tuning fork bilateral. Protective threshold with Semmes Wienstein monofilament intact to all pedal sites bilateral. Patellar and Achilles deep tendon reflexes 2+ bilateral. No Babinski or clonus noted bilateral.   Musculoskeletal: No gross boney pedal deformities bilateral. No pain, crepitus, or limitation noted with foot and ankle range of motion bilateral. Muscular strength 5/5 in all groups tested bilateral.  Gait: Unassisted, Nonantalgic.    Radiographs:  None taken  Assessment & Plan:   Assessment: Callused dry feet plantar aspect with cracked fissured skin and nail dystrophy probable onychomycosis.  Plan: Discussed wearing tennis shoes at all times with the patient.  She is to no longer go barefooted wear sandals or flip-flops until this is resolved.  I debrided all reactive hyperkeratoses today to the best my ability and started her on an emollient cream to help with the skin.     Paulita Licklider T. [place], [place]

## 2018-08-09 ENCOUNTER — Other Ambulatory Visit: Payer: Self-pay | Admitting: Internal Medicine

## 2018-08-09 DIAGNOSIS — Z1239 Encounter for other screening for malignant neoplasm of breast: Secondary | ICD-10-CM

## 2018-08-23 ENCOUNTER — Ambulatory Visit
Admission: RE | Admit: 2018-08-23 | Discharge: 2018-08-23 | Disposition: A | Discharge status: Home / Self Care | Payer: Medicare Other | Source: Ambulatory Visit | Attending: Internal Medicine | Admitting: Internal Medicine

## 2018-08-23 DIAGNOSIS — R928 Other abnormal and inconclusive findings on diagnostic imaging of breast: Secondary | ICD-10-CM | POA: Diagnosis not present

## 2018-08-23 DIAGNOSIS — Z1239 Encounter for other screening for malignant neoplasm of breast: Secondary | ICD-10-CM

## 2018-08-23 DIAGNOSIS — Z1231 Encounter for screening mammogram for malignant neoplasm of breast: Secondary | ICD-10-CM | POA: Diagnosis present

## 2018-10-22 ENCOUNTER — Other Ambulatory Visit: Payer: Self-pay | Admitting: Internal Medicine

## 2018-10-22 DIAGNOSIS — M545 Low back pain, unspecified: Secondary | ICD-10-CM

## 2018-10-22 DIAGNOSIS — G8929 Other chronic pain: Secondary | ICD-10-CM

## 2018-10-30 ENCOUNTER — Ambulatory Visit
Admission: RE | Admit: 2018-10-30 | Discharge: 2018-10-30 | Disposition: A | Discharge status: Home / Self Care | Payer: Medicare Other | Source: Ambulatory Visit | Attending: Internal Medicine | Admitting: Internal Medicine

## 2018-10-30 ENCOUNTER — Other Ambulatory Visit: Payer: Self-pay

## 2018-10-30 DIAGNOSIS — G8929 Other chronic pain: Secondary | ICD-10-CM

## 2018-10-30 DIAGNOSIS — M545 Low back pain: Secondary | ICD-10-CM | POA: Insufficient documentation

## 2019-05-10 DIAGNOSIS — S83272A Complex tear of lateral meniscus, current injury, left knee, initial encounter: Secondary | ICD-10-CM | POA: Insufficient documentation

## 2019-05-29 ENCOUNTER — Other Ambulatory Visit: Payer: Self-pay | Admitting: Obstetrics and Gynecology

## 2019-05-29 ENCOUNTER — Ambulatory Visit: Payer: Medicare Other

## 2019-05-29 ENCOUNTER — Ambulatory Visit
Admission: RE | Admit: 2019-05-29 | Discharge: 2019-05-29 | Disposition: A | Discharge status: Home / Self Care | Payer: Medicare Other | Source: Ambulatory Visit | Attending: Infectious Diseases | Admitting: Infectious Diseases

## 2019-05-29 ENCOUNTER — Other Ambulatory Visit: Payer: Self-pay

## 2019-05-29 ENCOUNTER — Other Ambulatory Visit: Payer: Self-pay | Admitting: Infectious Diseases

## 2019-05-29 DIAGNOSIS — M79662 Pain in left lower leg: Secondary | ICD-10-CM

## 2019-05-29 DIAGNOSIS — M7989 Other specified soft tissue disorders: Secondary | ICD-10-CM | POA: Diagnosis present

## 2019-06-03 ENCOUNTER — Telehealth (INDEPENDENT_AMBULATORY_CARE_PROVIDER_SITE_OTHER): Payer: Self-pay | Admitting: Nurse Practitioner

## 2019-06-03 ENCOUNTER — Telehealth (INDEPENDENT_AMBULATORY_CARE_PROVIDER_SITE_OTHER): Payer: Self-pay | Admitting: Vascular Surgery

## 2019-06-03 NOTE — Telephone Encounter (Signed)
We can see her tomorrow morning if she is able, if not it will likely be next week before we have space.  Me or Dew

## 2019-06-03 NOTE — Telephone Encounter (Signed)
Please contact the patient to schedule as advised below. AS, CMA

## 2019-06-03 NOTE — Telephone Encounter (Signed)
LLE edema x 2 weeks with redness that has decreased since PCP put her on 2 different antibiotics and gave a shot of antibiotic. No discoloration. Painful and weeping. Patient requesting an apt asap. Patient is leaving tomorrow to go out of town and wont be back until Friday afternoon.  Please advise. AS, CMA

## 2019-06-10 ENCOUNTER — Other Ambulatory Visit: Payer: Self-pay

## 2019-06-10 ENCOUNTER — Ambulatory Visit (INDEPENDENT_AMBULATORY_CARE_PROVIDER_SITE_OTHER): Payer: Medicare Other | Admitting: Nurse Practitioner

## 2019-06-10 ENCOUNTER — Encounter (INDEPENDENT_AMBULATORY_CARE_PROVIDER_SITE_OTHER): Payer: Self-pay | Admitting: Nurse Practitioner

## 2019-06-10 VITALS — BP 118/72 | HR 98 | Resp 16 | Ht 62.0 in | Wt 170.0 lb

## 2019-06-10 DIAGNOSIS — I890 Lymphedema, not elsewhere classified: Secondary | ICD-10-CM

## 2019-06-10 DIAGNOSIS — I10 Essential (primary) hypertension: Secondary | ICD-10-CM

## 2019-06-10 DIAGNOSIS — M5136 Other intervertebral disc degeneration, lumbar region: Secondary | ICD-10-CM | POA: Diagnosis not present

## 2019-06-10 DIAGNOSIS — I70219 Atherosclerosis of native arteries of extremities with intermittent claudication, unspecified extremity: Secondary | ICD-10-CM

## 2019-06-10 NOTE — Progress Notes (Signed)
SUBJECTIVE:  Patient ID: Sabrina Stanley, female    DOB: 16-Aug-1961, 58 y.o.   MRN: [id number] Chief Complaint  Patient presents with  . Follow-up    edema    HPI  Sabrina Stanley is a 58 y.o. female that presents today with complaints of left lower extremity edema.  The patient states this happened several weeks ago and the patient's leg was extremely swollen and red.  The patient states that she was treated with 2 different antibiotics as well as an antibiotic shot in order to treat her infection.  The redness is gone however the patient still has some persistent swelling and weeping of her left lower extremity.  She notes that the weeping is worse when she is out of the shower.  The patient has a previous history of lymphedema and currently has a lymphedema pump.  The patient denies wearing medical grade 1 compression stockings or utilizing her lymphedema pump.  The patient has a previous history of left great saphenous vein ablation on 09/11/2017.  She denies any fever, chills, nausea, vomiting or diarrhea.  She denies any chest pain or shortness of breath.  She denies any TIA-like symptoms.  Past Medical History:  Diagnosis Date  . Abnormal mammogram, unspecified   . Alcoholism ([place])   . Allergic state   . Anemia   . Anxiety   . Arthritis   . Bilateral breast cysts   . Bipolar 1 disorder ([place])   . Bipolar 1 disorder ([place])   . Bone marrow disorder   . Bronchitis   . Cervical stenosis of spine   . Cervicalgia   . Chronic diarrhea   . Chronic fatigue disorder   . Chronic pain   . COPD (chronic obstructive pulmonary disease) ([place])   . Depression   . Depression   . Eczema   . Esophageal reflux   . Fibromyalgia   . GERD (gastroesophageal reflux disease)   . Headache   . Hiatal hernia   . History of esophagogastroduodenoscopy (EGD)   . Hyperlipidemia   . Hyperlipidemia   . Hypertension   . Hypothyroidism   . Insomnia   . Lithium toxicity   . Menstrual irregularity   .  Myalgia   . Osteopenia   . Osteopenia   . Panic disorder   . Pleurisy   . Pneumonia   . Psoriasis   . PVD (peripheral vascular disease) ([place])   . Sleep apnea   . Tobacco abuse   . UTI (urinary tract infection)   . Vitamin D deficiency     Past Surgical History:  Procedure Laterality Date  . ABDOMINAL HYSTERECTOMY  1996  . BREAST BIOPSY Right 08-21-12   FIBROADENOMATOUS CHANGE WITH CALCIFICATIONS.   Marland Kitchen BREAST BIOPSY Right    FIBROUS SCARRING, negative for atypia  . COLONOSCOPY    . [place]  . ESOPHAGOGASTRODUODENOSCOPY    . ESOPHAGOGASTRODUODENOSCOPY N/A 09/17/2015   Procedure: ESOPHAGOGASTRODUODENOSCOPY (EGD);  Surgeon: Hulen Luster, MD;  Location: [facility] ENDOSCOPY;  Service: Gastroenterology;  Laterality: N/A;  . EYE SURGERY  2010  . KNEE SURGERY Right 1998   meniscus repair  . NASAL SINUS SURGERY  1998, 2000  . NECK SURGERY     x 2  . OOPHORECTOMY    . SPINE SURGERY  2006, 2008   cervical fusion  . TONSILLECTOMY      Social History   Socioeconomic History  . Marital status: Widowed  Spouse name: Not on file  . Number of children: Not on file  . Years of education: Not on file  . Highest education level: Not on file  Occupational History  . Not on file  Social Needs  . Financial resource strain: Not on file  . Food insecurity    Worry: Not on file    Inability: Not on file  . Transportation needs    Medical: Not on file    Non-medical: Not on file  Tobacco Use  . Smoking status: Current Every Day Smoker    Packs/day: 1.00    Years: 35.00    Pack years: 35.00    Types: Cigarettes  . Smokeless tobacco: Never Used  Substance and Sexual Activity  . Alcohol use: No    Alcohol/week: 0.0 standard drinks  . Drug use: No  . Sexual activity: Not on file  Lifestyle  . Physical activity    Days per week: Not on file    Minutes per session: Not on file  . Stress: Not on file  Relationships  . Social Herbalist on  phone: Not on file    Gets together: Not on file    Attends religious service: Not on file    Active member of club or organization: Not on file    Attends meetings of clubs or organizations: Not on file    Relationship status: Not on file  . Intimate partner violence    Fear of current or ex partner: Not on file    Emotionally abused: Not on file    Physically abused: Not on file    Forced sexual activity: Not on file  Other Topics Concern  . Not on file  Social History Narrative  . Not on file    Family History  Problem Relation Age of Onset  . Lung cancer Father   . Cancer Father   . Depression Mother   . Hypertension Mother   . Breast cancer Maternal Aunt        great aunt  . Lung cancer Paternal Uncle   . Cervical cancer Maternal Grandmother     Allergies  Allergen Reactions  . Asa [Aspirin]     Stomach pain  . Codeine     Other reaction(s): Diarrhea and vomiting (finding)  . Cyclobenzaprine Other (See Comments)    Other reaction(s): Altered mental status (finding), Unknown  . Darvocet [Propoxyphene N-Acetaminophen]     Stomach pain  . Effexor [Venlafaxine Hcl] Other (See Comments)    hallucinations  . Erythromycin Other (See Comments)    Other reaction(s): Distress (finding)  . Meperidine-Promethazine     Other reaction(s): Other (qualifier value) Causes internal itching  . Neurontin [Gabapentin]     fatigue  . Nsaids     Bad stomach ache   . Propoxyphene     Other reaction(s): Other (qualifier value) causes constipation  . Mobic [Meloxicam] Palpitations  . Penicillins Rash     Review of Systems   Review of Systems: Negative Unless Checked Constitutional: _0 Weight loss  _1 Fever  _2 Chills Cardiac: _3 Chest pain   _4  Atrial Fibrillation  _5 Palpitations   _6 Shortness of breath when laying flat   _7 Shortness of breath with exertion. _8 Shortness of breath at rest Vascular:  _9 Pain in legs with walking   _10 Pain in legs with standing _11 Pain in legs when  laying flat   _12 Claudication    _13 Pain in feet when laying flat    _14 History of DVT   _15   Phlebitis   _0 Swelling in legs   _1 Varicose veins   _2 Non-healing ulcers Pulmonary:   _3 Uses home oxygen   _4 Productive cough   _5 Hemoptysis   _6 Wheeze  _7 COPD   _8 Asthma Neurologic:  _9 Dizziness   _10 Seizures  _11 Blackouts _12 History of stroke   _13 History of TIA  _14 Aphasia   _15 Temporary Blindness   _16 Weakness or numbness in arm   _17 Weakness or numbness in leg Musculoskeletal:   _18 Joint swelling   _19 Joint pain   _20 Low back pain  _21  History of Knee Replacement _22 Arthritis _23 back Surgeries  _24  Spinal Stenosis    Hematologic:  _25 Easy bruising  _26 Easy bleeding   _27 Hypercoagulable state   _28 Anemic Gastrointestinal:  _29 Diarrhea   _30 Vomiting  _31 Gastroesophageal reflux/heartburn   _32 Difficulty swallowing. _33 Abdominal pain Genitourinary:  _34 Chronic kidney disease   _35 Difficult urination  _36 Anuric   _37 Blood in urine _38 Frequent urination  _39 Burning with urination   _40 Hematuria Skin:  _41 Rashes   _42 Ulcers _43 Wounds Psychological:  _44 History of anxiety   _45  History of major depression  _46  Memory Difficulties      OBJECTIVE:   Physical Exam  BP 118/72 (BP Location: Right Arm)   Pulse 98   Resp 16   Ht _47  (1.575 m)   Wt 170 lb (77.1 kg)   BMI 31.09 kg/m   Gen: WD/WN, NAD Head: [place]/AT, No temporalis wasting.  Ear/Nose/Throat: Hearing grossly intact, nares w/o erythema or drainage Eyes: PER, EOMI, sclera nonicteric.  Neck: Supple, no masses.  No JVD.  Pulmonary:  Good air movement, no use of accessory muscles.  Cardiac: RRR Vascular:  1+ edema right lower extremity, 3+ edema left.  Bilateral stasis dermatitis. Vessel Right Left  Radial Palpable Palpable  Dorsalis Pedis Palpable Not Palpable  Posterior Tibial Palpable Trace Palpable   Gastrointestinal: soft, non-distended. No guarding/no peritoneal signs.  Musculoskeletal: M/S 5/5 throughout.  No deformity or atrophy.  Neurologic: Pain and light touch  intact in extremities.  Symmetrical.  Speech is fluent. Motor exam as listed above. Psychiatric: Judgment intact, Mood & affect appropriate for pt's clinical situation. Dermatologic:  No ulcers present. No changes consistent with cellulitis. Lymph : No Cervical lymphadenopathy, no lichenification or skin changes of chronic lymphedema.       ASSESSMENT AND PLAN:  1. Lymphedema of both lower extremities We will place the patient in [place] wraps today to gain control of her swelling.  The patient has been advised that the Unna wraps are not to become wet and that she is to present to the office weekly to have them changed.  We will assess her swelling after 4 weeks.  Had a long discussion with the patient about the pathophysiology of lymphedema and the need for conservative therapy.  We discussed how utilizing conservative therapy prevents episodes of cellulitis and weeping.  The patient understands that she should utilize her compression socks on a daily basis, as well as her lymphedema pump.  She will also attempt to utilize elevation on a more regular basis.  2. DDD (degenerative disc disease), lumbar Continue NSAID medications as already ordered, these medications have been reviewed and there are no changes at this time.  Continued activity and therapy was stressed.   3. Benign essential hypertension Continue antihypertensive medications as already ordered, these medications have been reviewed and there are no changes at this time.   4. Atherosclerotic peripheral vascular disease with intermittent claudication ([place]) The patient has complaints of cold chills in her left lower extremity.  This sensation may be due to the  edema however it is noted that the patient has peripheral artery disease.  The patient also has decreased pulses on this foot.  She is also an everyday smoker.  We will obtain noninvasive studies in order to assess the patient's vascular status.   Current Outpatient  Medications on File Prior to Visit  Medication Sig Dispense Refill  . ABILIFY 5 MG tablet 5 mg daily.     Marland Kitchen atorvastatin (LIPITOR) 10 MG tablet     . Biotin 5 MG CAPS Take 1,000 mg by mouth daily.    . cyanocobalamin (,VITAMIN B-12,) 1000 MCG/ML injection Inject into the muscle.    . diclofenac sodium (VOLTAREN) 1 % GEL Apply topically.    . divalproex (DEPAKOTE ER) 500 MG 24 hr tablet     . FLUoxetine (PROZAC) 20 MG capsule Take 60 mg by mouth daily.     . Fluticasone-Salmeterol (ADVAIR) 100-50 MCG/DOSE AEPB Inhale 1 puff into the lungs daily as needed.    . furosemide (LASIX) 20 MG tablet Take by mouth.    . Meth-Hyo-M Bl-Na Phos-Ph Sal (URIBEL) 118 MG CAPS Take by mouth.    . morphine (MS CONTIN) 15 MG 12 hr tablet Limit 2-3 tabs by mouth per day if tolerated 90 tablet 0  . naloxone (NARCAN) 4 MG/0.1ML LIQD nasal spray kit Place into the nose.    Marland Kitchen omeprazole (PRILOSEC) 40 MG capsule Take 40 mg by mouth daily.    . ondansetron (ZOFRAN-ODT) 4 MG disintegrating tablet     . oxyCODONE-acetaminophen (PERCOCET) 7.5-325 MG tablet Take 1 tablet by mouth every 4 (four) hours as needed for severe pain.    . potassium chloride SA (K-DUR,KLOR-CON) 20 MEQ tablet Take 20 mEq by mouth daily.    . primidone (MYSOLINE) 50 MG tablet Take 50 mg by mouth 2 (two) times daily.    . ranitidine (ZANTAC) 300 MG tablet Take 300 mg by mouth at bedtime.    Marland Kitchen rOPINIRole (REQUIP) 1 MG tablet Take 1 mg by mouth at bedtime.     . SUMAtriptan (IMITREX) 100 MG tablet Take 100 mg by mouth every 2 (two) hours as needed for migraine.    Marland Kitchen tiZANidine (ZANAFLEX) 4 MG tablet TAKE 1 TABLET BY MOUTH NIGHTLY    . topiramate (TOPAMAX) 100 MG tablet Take 100 mg by mouth 2 (two) times daily.    . vitamin C (ASCORBIC ACID) 500 MG tablet Take 500 mg by mouth daily.    . Vitamin D, Ergocalciferol, (DRISDOL) 50000 units CAPS capsule     . zolpidem (AMBIEN) 10 MG tablet Take 10 mg by mouth daily.      Current Facility-Administered  Medications on File Prior to Visit  Medication Dose Route Frequency Provider Last Rate Last Dose  . lidocaine (PF) (XYLOCAINE) 1 % injection 10 mL  10 mL Subcutaneous Once Mohammed Kindle, MD      . midazolam (VERSED) 5 MG/5ML injection 5 mg  5 mg Intravenous Once Mohammed Kindle, MD      . orphenadrine (NORFLEX) injection 60 mg  60 mg Intramuscular Once Mohammed Kindle, MD        There are no Patient Instructions on file for this visit. No follow-ups on file.   Kris Hartmann, NP  This note was completed with Sales executive.  Any errors are purely unintentional.

## 2019-06-17 ENCOUNTER — Encounter (INDEPENDENT_AMBULATORY_CARE_PROVIDER_SITE_OTHER): Payer: Medicare Other

## 2019-06-18 ENCOUNTER — Other Ambulatory Visit: Payer: Self-pay

## 2019-06-18 ENCOUNTER — Encounter (INDEPENDENT_AMBULATORY_CARE_PROVIDER_SITE_OTHER): Payer: Self-pay

## 2019-06-18 ENCOUNTER — Ambulatory Visit (INDEPENDENT_AMBULATORY_CARE_PROVIDER_SITE_OTHER): Payer: Medicare Other | Admitting: Vascular Surgery

## 2019-06-18 VITALS — BP 99/63 | HR 73 | Resp 16 | Wt 175.0 lb

## 2019-06-18 DIAGNOSIS — I890 Lymphedema, not elsewhere classified: Secondary | ICD-10-CM

## 2019-06-18 NOTE — Progress Notes (Signed)
History of Present Illness  There is no documented history at this time  Assessments & Plan   There are no diagnoses linked to this encounter.    Additional instructions  Subjective:  Patient presents with venous ulcer of the Left lower extremity.    Procedure:  3 layer unna wrap was placed Left lower extremity.   Plan:   Follow up in one week.  

## 2019-06-24 ENCOUNTER — Ambulatory Visit (INDEPENDENT_AMBULATORY_CARE_PROVIDER_SITE_OTHER): Payer: Medicare Other | Admitting: Nurse Practitioner

## 2019-06-24 ENCOUNTER — Telehealth (INDEPENDENT_AMBULATORY_CARE_PROVIDER_SITE_OTHER): Payer: Self-pay

## 2019-06-24 ENCOUNTER — Other Ambulatory Visit: Payer: Self-pay

## 2019-06-24 VITALS — BP 132/81 | HR 88 | Resp 15 | Wt 172.8 lb

## 2019-06-24 DIAGNOSIS — I890 Lymphedema, not elsewhere classified: Secondary | ICD-10-CM | POA: Diagnosis not present

## 2019-06-24 NOTE — Telephone Encounter (Signed)
Patient had informed me that on Friday she started to have shortness of breath and notice that her right leg had some swelling with sore to touch. I ask the patient had she informed her PCP and she stated hasn't at the time. I advise the patient to contact her PCP about what was going on and I offered to unna wrap the right leg but the patient stated she will wear a compression stocking. I confirmed with Dr Delana Meyer with medical advice.

## 2019-06-24 NOTE — Progress Notes (Signed)
History of Present Illness  There is no documented history at this time  Assessments & Plan   There are no diagnoses linked to this encounter.    Additional instructions  Subjective:  Patient presents with venous ulcer of the Left lower extremity.    Procedure:  3 layer unna wrap was placed Left lower extremity.   Plan:   Follow up in one week.  

## 2019-06-26 ENCOUNTER — Encounter (INDEPENDENT_AMBULATORY_CARE_PROVIDER_SITE_OTHER): Payer: Self-pay | Admitting: Nurse Practitioner

## 2019-07-01 ENCOUNTER — Other Ambulatory Visit: Payer: Self-pay

## 2019-07-01 ENCOUNTER — Ambulatory Visit (INDEPENDENT_AMBULATORY_CARE_PROVIDER_SITE_OTHER): Payer: Medicare Other | Admitting: Nurse Practitioner

## 2019-07-01 ENCOUNTER — Ambulatory Visit (INDEPENDENT_AMBULATORY_CARE_PROVIDER_SITE_OTHER): Payer: Medicare Other

## 2019-07-01 ENCOUNTER — Encounter (INDEPENDENT_AMBULATORY_CARE_PROVIDER_SITE_OTHER): Payer: Self-pay | Admitting: Nurse Practitioner

## 2019-07-01 VITALS — BP 126/77 | HR 83 | Resp 16 | Wt 169.0 lb

## 2019-07-01 DIAGNOSIS — I70219 Atherosclerosis of native arteries of extremities with intermittent claudication, unspecified extremity: Secondary | ICD-10-CM

## 2019-07-01 DIAGNOSIS — M5136 Other intervertebral disc degeneration, lumbar region: Secondary | ICD-10-CM | POA: Diagnosis not present

## 2019-07-01 DIAGNOSIS — I890 Lymphedema, not elsewhere classified: Secondary | ICD-10-CM | POA: Diagnosis not present

## 2019-07-01 DIAGNOSIS — I10 Essential (primary) hypertension: Secondary | ICD-10-CM | POA: Diagnosis not present

## 2019-07-01 NOTE — Progress Notes (Signed)
SUBJECTIVE:  Patient ID: Sabrina Stanley, female    DOB: 09-24-1960, 58 y.o.   MRN: [id number] Chief Complaint  Patient presents with  . Follow-up    u/s and unna check    HPI  Sabrina Stanley is a 58 y.o. female that presents today for evaluation of the left lower extremity after Unna wraps.  The patient states that the lower extremity weeping and ulcerations that were present have resolved.  She also states that her leg feels much better especially in the ankle area.  The patient still continues to complain of bilateral leg pain however this has been an ongoing issue for some time.  She denies any fever, chills, nausea, vomiting or diarrhea.  She states that overall her legs feel much better.  Today the patient underwent noninvasive studies to determine if her leg pain had a possible arterial cause.  Her right ABI was 1.18 and her left is 1.27.  Her TBI 0.98 and the left is 1.23.  No previous ABIs to compare to.  The patient has triphasic tibial artery waveforms with very strong toe waveforms bilaterally.  Past Medical History:  Diagnosis Date  . Abnormal mammogram, unspecified   . Alcoholism ([place])   . Allergic state   . Anemia   . Anxiety   . Arthritis   . Bilateral breast cysts   . Bipolar 1 disorder ([place])   . Bipolar 1 disorder ([place])   . Bone marrow disorder   . Bronchitis   . Cervical stenosis of spine   . Cervicalgia   . Chronic diarrhea   . Chronic fatigue disorder   . Chronic pain   . COPD (chronic obstructive pulmonary disease) ([place])   . Depression   . Depression   . Eczema   . Esophageal reflux   . Fibromyalgia   . GERD (gastroesophageal reflux disease)   . Headache   . Hiatal hernia   . History of esophagogastroduodenoscopy (EGD)   . Hyperlipidemia   . Hyperlipidemia   . Hypertension   . Hypothyroidism   . Insomnia   . Lithium toxicity   . Menstrual irregularity   . Myalgia   . Osteopenia   . Osteopenia   . Panic disorder   . Pleurisy   . Pneumonia    . Psoriasis   . PVD (peripheral vascular disease) ([place])   . Sleep apnea   . Tobacco abuse   . UTI (urinary tract infection)   . Vitamin D deficiency     Past Surgical History:  Procedure Laterality Date  . ABDOMINAL HYSTERECTOMY  1996  . BREAST BIOPSY Right 08-21-12   FIBROADENOMATOUS CHANGE WITH CALCIFICATIONS.   Marland Kitchen BREAST BIOPSY Right    FIBROUS SCARRING, negative for atypia  . COLONOSCOPY    . [place]  . ESOPHAGOGASTRODUODENOSCOPY    . ESOPHAGOGASTRODUODENOSCOPY N/A 09/17/2015   Procedure: ESOPHAGOGASTRODUODENOSCOPY (EGD);  Surgeon: Hulen Luster, MD;  Location: [facility] ENDOSCOPY;  Service: Gastroenterology;  Laterality: N/A;  . EYE SURGERY  2010  . KNEE SURGERY Right 1998   meniscus repair  . NASAL SINUS SURGERY  1998, 2000  . NECK SURGERY     x 2  . OOPHORECTOMY    . SPINE SURGERY  2006, 2008   cervical fusion  . TONSILLECTOMY      Social History   Socioeconomic History  . Marital status: Widowed    Spouse name: Not on file  . Number of children: Not  on file  . Years of education: Not on file  . Highest education level: Not on file  Occupational History  . Not on file  Social Needs  . Financial resource strain: Not on file  . Food insecurity    Worry: Not on file    Inability: Not on file  . Transportation needs    Medical: Not on file    Non-medical: Not on file  Tobacco Use  . Smoking status: Current Every Day Smoker    Packs/day: 1.00    Years: 35.00    Pack years: 35.00    Types: Cigarettes  . Smokeless tobacco: Never Used  Substance and Sexual Activity  . Alcohol use: No    Alcohol/week: 0.0 standard drinks  . Drug use: No  . Sexual activity: Not on file  Lifestyle  . Physical activity    Days per week: Not on file    Minutes per session: Not on file  . Stress: Not on file  Relationships  . Social Herbalist on phone: Not on file    Gets together: Not on file    Attends religious service: Not on  file    Active member of club or organization: Not on file    Attends meetings of clubs or organizations: Not on file    Relationship status: Not on file  . Intimate partner violence    Fear of current or ex partner: Not on file    Emotionally abused: Not on file    Physically abused: Not on file    Forced sexual activity: Not on file  Other Topics Concern  . Not on file  Social History Narrative  . Not on file    Family History  Problem Relation Age of Onset  . Lung cancer Father   . Cancer Father   . Depression Mother   . Hypertension Mother   . Breast cancer Maternal Aunt        great aunt  . Lung cancer Paternal Uncle   . Cervical cancer Maternal Grandmother     Allergies  Allergen Reactions  . Asa [Aspirin]     Stomach pain  . Codeine     Other reaction(s): Diarrhea and vomiting (finding)  . Cyclobenzaprine Other (See Comments)    Other reaction(s): Altered mental status (finding), Unknown  . Darvocet [Propoxyphene N-Acetaminophen]     Stomach pain  . Effexor [Venlafaxine Hcl] Other (See Comments)    hallucinations  . Erythromycin Other (See Comments)    Other reaction(s): Distress (finding)  . Meperidine-Promethazine     Other reaction(s): Other (qualifier value) Causes internal itching  . Neurontin [Gabapentin]     fatigue  . Nsaids     Bad stomach ache   . Propoxyphene     Other reaction(s): Other (qualifier value) causes constipation  . Mobic [Meloxicam] Palpitations  . Penicillins Rash     Review of Systems   Review of Systems: Negative Unless Checked Constitutional: '[]'$ Weight loss  '[]'$ Fever  '[]'$ Chills Cardiac: '[]'$ Chest pain   '[]'$  Atrial Fibrillation  '[]'$ Palpitations   '[]'$ Shortness of breath when laying flat   '[]'$ Shortness of breath with exertion. '[]'$ Shortness of breath at rest Vascular:  '[x]'$ Pain in legs with walking   '[x]'$ Pain in legs with standing '[]'$ Pain in legs when laying flat   '[]'$ Claudication    '[]'$ Pain in feet when laying flat    '[]'$ History of DVT    '[]'$ Phlebitis   '[x]'$ Swelling in legs   '[x]'$ Varicose veins   '[]'$   Non-healing ulcers Pulmonary:   '[]'$ Uses home oxygen   '[]'$ Productive cough   '[]'$ Hemoptysis   '[]'$ Wheeze  '[]'$ COPD   '[]'$ Asthma Neurologic:  '[]'$ Dizziness   '[]'$ Seizures  '[]'$ Blackouts '[]'$ History of stroke   '[]'$ History of TIA  '[]'$ Aphasia   '[]'$ Temporary Blindness   '[]'$ Weakness or numbness in arm   '[x]'$ Weakness or numbness in leg Musculoskeletal:   '[]'$ Joint swelling   '[]'$ Joint pain   '[]'$ Low back pain  '[]'$  History of Knee Replacement '[]'$ Arthritis '[]'$ back Surgeries  '[]'$  Spinal Stenosis    Hematologic:  '[]'$ Easy bruising  '[]'$ Easy bleeding   '[]'$ Hypercoagulable state   '[]'$ Anemic Gastrointestinal:  '[x]'$ Diarrhea   '[]'$ Vomiting  '[x]'$ Gastroesophageal reflux/heartburn   '[]'$ Difficulty swallowing. '[]'$ Abdominal pain Genitourinary:  '[]'$ Chronic kidney disease   '[]'$ Difficult urination  '[]'$ Anuric   '[]'$ Blood in urine '[]'$ Frequent urination  '[]'$ Burning with urination   '[]'$ Hematuria Skin:  '[]'$ Rashes   '[]'$ Ulcers '[]'$ Wounds Psychological:  '[x]'$ History of anxiety   '[x]'$  History of major depression  '[]'$  Memory Difficulties      OBJECTIVE:   Physical Exam  BP 126/77 (BP Location: Right Arm)   Pulse 83   Resp 16   Wt 169 lb (76.7 kg)   BMI 30.91 kg/m   Gen: WD/WN, NAD Head: [place]/AT, No temporalis wasting.  Ear/Nose/Throat: Hearing grossly intact, nares w/o erythema or drainage Eyes: PER, EOMI, sclera nonicteric.  Neck: Supple, no masses.  No JVD.  Pulmonary:  Good air movement, no use of accessory muscles.  Cardiac: RRR Vascular:  Minimal edema bilaterally Vessel Right Left  Radial Palpable Palpable  Dorsalis Pedis Palpable  not palpable  Posterior Tibial Palpable  trace palpable   Gastrointestinal: soft, non-distended. No guarding/no peritoneal signs.  Musculoskeletal: M/S 5/5 throughout.  No deformity or atrophy.  Neurologic: Pain and light touch intact in extremities.  Symmetrical.  Speech is fluent. Motor exam as listed above. Psychiatric: Judgment intact, Mood & affect appropriate for pt's clinical  situation. Dermatologic:  Bilateral stasis dermatitis.  No changes consistent with cellulitis. Lymph : No Cervical lymphadenopathy, no lichenification or skin changes of chronic lymphedema.       ASSESSMENT AND PLAN:  1. Lymphedema Today the patient's lower extremity edema is much improved.  We discussed undergoing conservative therapy on a consistent basis.  The patient will begin to wear medical grade 1 compression stockings on a daily basis.  She will endeavor to elevate her lower extremities as much as possible and to exercise as much as tolerable.  Patient does have a lymphedema pump and I have instructed her to utilize it on a daily basis starting with an hour during the evening.  We will see the patient back in office in 6 months.  Patient is instructed to contact our office sooner if she notices that her lower extremity edema returns or she begins to have weeping or ulcerations again.  2. Benign essential hypertension Continue antihypertensive medications as already ordered, these medications have been reviewed and there are no changes at this time.   3. DDD (degenerative disc disease), lumbar Continue NSAID medications as already ordered, these medications have been reviewed and there are no changes at this time.  Continued activity and therapy was stressed.    Current Outpatient Medications on File Prior to Visit  Medication Sig Dispense Refill  . ABILIFY 5 MG tablet 5 mg daily.     Marland Kitchen atorvastatin (LIPITOR) 10 MG tablet     . Biotin 5 MG CAPS Take 1,000 mg by mouth daily.    . cyanocobalamin (,VITAMIN B-12,) 1000 MCG/ML  injection Inject into the muscle.    . diclofenac sodium (VOLTAREN) 1 % GEL Apply topically.    . divalproex (DEPAKOTE ER) 500 MG 24 hr tablet     . doxycycline (VIBRA-TABS) 100 MG tablet Take 100 mg by mouth 2 (two) times daily.    Marland Kitchen FLUoxetine (PROZAC) 20 MG capsule Take 60 mg by mouth daily.     . Fluticasone-Salmeterol (ADVAIR) 100-50 MCG/DOSE AEPB Inhale  1 puff into the lungs daily as needed.    . furosemide (LASIX) 20 MG tablet Take by mouth.    . Meth-Hyo-M Bl-Na Phos-Ph Sal (URIBEL) 118 MG CAPS Take by mouth.    . morphine (MS CONTIN) 15 MG 12 hr tablet Limit 2-3 tabs by mouth per day if tolerated 90 tablet 0  . naloxone (NARCAN) 4 MG/0.1ML LIQD nasal spray kit Place into the nose.    Marland Kitchen omeprazole (PRILOSEC) 40 MG capsule Take 40 mg by mouth daily.    . ondansetron (ZOFRAN-ODT) 4 MG disintegrating tablet     . oxyCODONE-acetaminophen (PERCOCET) 7.5-325 MG tablet Take 1 tablet by mouth every 4 (four) hours as needed for severe pain.    . potassium chloride SA (K-DUR,KLOR-CON) 20 MEQ tablet Take 20 mEq by mouth daily.    . predniSONE (DELTASONE) 10 MG tablet Taper 6-6-4-4-2-2-1-1-off    . primidone (MYSOLINE) 50 MG tablet Take 50 mg by mouth 2 (two) times daily.    . ranitidine (ZANTAC) 300 MG tablet Take 300 mg by mouth at bedtime.    Marland Kitchen rOPINIRole (REQUIP) 1 MG tablet Take 1 mg by mouth at bedtime.     . SUMAtriptan (IMITREX) 100 MG tablet Take 100 mg by mouth every 2 (two) hours as needed for migraine.    Marland Kitchen tiZANidine (ZANAFLEX) 4 MG tablet TAKE 1 TABLET BY MOUTH NIGHTLY    . topiramate (TOPAMAX) 100 MG tablet Take 100 mg by mouth 2 (two) times daily.    . vitamin C (ASCORBIC ACID) 500 MG tablet Take 500 mg by mouth daily.    . Vitamin D, Ergocalciferol, (DRISDOL) 50000 units CAPS capsule     . zolpidem (AMBIEN) 10 MG tablet Take 10 mg by mouth daily.      Current Facility-Administered Medications on File Prior to Visit  Medication Dose Route Frequency Provider Last Rate Last Dose  . lidocaine (PF) (XYLOCAINE) 1 % injection 10 mL  10 mL Subcutaneous Once Mohammed Kindle, MD      . midazolam (VERSED) 5 MG/5ML injection 5 mg  5 mg Intravenous Once Mohammed Kindle, MD      . orphenadrine (NORFLEX) injection 60 mg  60 mg Intramuscular Once Mohammed Kindle, MD        There are no Patient Instructions on file for this visit. No follow-ups on  file.   Kris Hartmann, NP  This note was completed with Sales executive.  Any errors are purely unintentional.

## 2019-07-16 ENCOUNTER — Other Ambulatory Visit: Payer: Self-pay | Admitting: Internal Medicine

## 2019-07-16 DIAGNOSIS — Z1231 Encounter for screening mammogram for malignant neoplasm of breast: Secondary | ICD-10-CM

## 2019-07-16 DIAGNOSIS — E538 Deficiency of other specified B group vitamins: Secondary | ICD-10-CM | POA: Insufficient documentation

## 2019-08-27 ENCOUNTER — Ambulatory Visit
Admission: RE | Admit: 2019-08-27 | Discharge: 2019-08-27 | Disposition: A | Discharge status: Home / Self Care | Payer: Medicare Other | Source: Ambulatory Visit | Attending: Internal Medicine | Admitting: Internal Medicine

## 2019-08-27 DIAGNOSIS — Z1231 Encounter for screening mammogram for malignant neoplasm of breast: Secondary | ICD-10-CM | POA: Diagnosis present

## 2019-09-24 ENCOUNTER — Other Ambulatory Visit: Payer: Self-pay

## 2019-09-24 ENCOUNTER — Encounter (INDEPENDENT_AMBULATORY_CARE_PROVIDER_SITE_OTHER): Payer: Self-pay | Admitting: Vascular Surgery

## 2019-09-24 ENCOUNTER — Ambulatory Visit (INDEPENDENT_AMBULATORY_CARE_PROVIDER_SITE_OTHER): Payer: Medicare Other | Admitting: Vascular Surgery

## 2019-09-24 VITALS — BP 114/76 | HR 81 | Resp 16 | Ht 62.0 in | Wt 175.0 lb

## 2019-09-24 DIAGNOSIS — I10 Essential (primary) hypertension: Secondary | ICD-10-CM

## 2019-09-24 DIAGNOSIS — I890 Lymphedema, not elsewhere classified: Secondary | ICD-10-CM | POA: Diagnosis not present

## 2019-09-24 NOTE — Progress Notes (Signed)
MRN : [id number]  Sabrina Stanley is a 59 y.o. (04/26/1961) female who presents with chief complaint of  Chief Complaint  Patient presents with  . Follow-up    Checking for lympedema  .  History of Present Illness: Patient returns today in follow up of her leg swelling and lymphedema.  She has been having worsening symptoms in her right leg.  Previously, her left leg was wrapped for ulceration and swelling.  She says she developed blisters and swelling on the right leg which slowly got better under the treatment from her primary care physician.  She still complains of pins-and-needles in her legs, daily pain, and swelling.  The right leg is a little worse than the left at this point.  She has not been wearing her compression stockings.  She has not been using her lymphedema pump.  She has not been elevating her legs.  Current Outpatient Medications  Medication Sig Dispense Refill  . ABILIFY 5 MG tablet 5 mg daily.     Marland Kitchen atorvastatin (LIPITOR) 10 MG tablet     . Biotin 5 MG CAPS Take 1,000 mg by mouth daily.    . cyanocobalamin (,VITAMIN B-12,) 1000 MCG/ML injection Inject into the muscle.    . diclofenac sodium (VOLTAREN) 1 % GEL Apply topically.    . divalproex (DEPAKOTE ER) 500 MG 24 hr tablet     . FLUoxetine (PROZAC) 20 MG capsule Take 60 mg by mouth daily.     . Fluticasone-Salmeterol (ADVAIR) 100-50 MCG/DOSE AEPB Inhale 1 puff into the lungs daily as needed.    . furosemide (LASIX) 20 MG tablet Take by mouth.    . Meth-Hyo-M Bl-Na Phos-Ph Sal (URIBEL) 118 MG CAPS Take by mouth.    . morphine (MS CONTIN) 15 MG 12 hr tablet Limit 2-3 tabs by mouth per day if tolerated 90 tablet 0  . naloxone (NARCAN) 4 MG/0.1ML LIQD nasal spray kit Place into the nose.    . oxyCODONE-acetaminophen (PERCOCET) 7.5-325 MG tablet Take 1 tablet by mouth every 4 (four) hours as needed for severe pain.    . potassium chloride SA (K-DUR,KLOR-CON) 20 MEQ tablet Take 20 mEq by mouth daily.    . primidone  (MYSOLINE) 50 MG tablet Take 50 mg by mouth 2 (two) times daily.    Marland Kitchen rOPINIRole (REQUIP) 1 MG tablet Take 1 mg by mouth at bedtime.     . SUMAtriptan (IMITREX) 100 MG tablet Take 100 mg by mouth every 2 (two) hours as needed for migraine.    Marland Kitchen tiZANidine (ZANAFLEX) 4 MG tablet TAKE 1 TABLET BY MOUTH NIGHTLY    . topiramate (TOPAMAX) 100 MG tablet Take 100 mg by mouth 2 (two) times daily.    . vitamin C (ASCORBIC ACID) 500 MG tablet Take 500 mg by mouth daily.    . Vitamin D, Ergocalciferol, (DRISDOL) 50000 units CAPS capsule     . zolpidem (AMBIEN) 10 MG tablet Take 10 mg by mouth daily.     Marland Kitchen doxycycline (VIBRA-TABS) 100 MG tablet Take 100 mg by mouth 2 (two) times daily.    Marland Kitchen omeprazole (PRILOSEC) 40 MG capsule Take 40 mg by mouth daily.    . ondansetron (ZOFRAN-ODT) 4 MG disintegrating tablet     . predniSONE (DELTASONE) 10 MG tablet Taper 6-6-4-4-2-2-1-1-off    . ranitidine (ZANTAC) 300 MG tablet Take 300 mg by mouth at bedtime.     Current Facility-Administered Medications  Medication Dose Route Frequency Provider Last Rate Last  Admin  . lidocaine (PF) (XYLOCAINE) 1 % injection 10 mL  10 mL Subcutaneous Once Mohammed Kindle, MD      . midazolam (VERSED) 5 MG/5ML injection 5 mg  5 mg Intravenous Once Mohammed Kindle, MD      . orphenadrine (NORFLEX) injection 60 mg  60 mg Intramuscular Once Mohammed Kindle, MD        Past Medical History:  Diagnosis Date  . Abnormal mammogram, unspecified   . Alcoholism ([place])   . Allergic state   . Anemia   . Anxiety   . Arthritis   . Bilateral breast cysts   . Bipolar 1 disorder ([place])   . Bipolar 1 disorder ([place])   . Bone marrow disorder   . Bronchitis   . Cervical stenosis of spine   . Cervicalgia   . Chronic diarrhea   . Chronic fatigue disorder   . Chronic pain   . COPD (chronic obstructive pulmonary disease) ([place])   . Depression   . Depression   . Eczema   . Esophageal reflux   . Fibromyalgia   . GERD (gastroesophageal reflux  disease)   . Headache   . Hiatal hernia   . History of esophagogastroduodenoscopy (EGD)   . Hyperlipidemia   . Hyperlipidemia   . Hypertension   . Hypothyroidism   . Insomnia   . Lithium toxicity   . Menstrual irregularity   . Myalgia   . Osteopenia   . Osteopenia   . Panic disorder   . Pleurisy   . Pneumonia   . Psoriasis   . PVD (peripheral vascular disease) ([place])   . Sleep apnea   . Tobacco abuse   . UTI (urinary tract infection)   . Vitamin D deficiency     Past Surgical History:  Procedure Laterality Date  . ABDOMINAL HYSTERECTOMY  1996  . BREAST BIOPSY Right 08-21-12   FIBROADENOMATOUS CHANGE WITH CALCIFICATIONS.   Marland Kitchen BREAST BIOPSY Right    FIBROUS SCARRING, negative for atypia  . COLONOSCOPY    . [place]  . ESOPHAGOGASTRODUODENOSCOPY    . ESOPHAGOGASTRODUODENOSCOPY N/A 09/17/2015   Procedure: ESOPHAGOGASTRODUODENOSCOPY (EGD);  Surgeon: Hulen Luster, MD;  Location: [facility] ENDOSCOPY;  Service: Gastroenterology;  Laterality: N/A;  . EYE SURGERY  2010  . KNEE SURGERY Right 1998   meniscus repair  . NASAL SINUS SURGERY  1998, 2000  . NECK SURGERY     x 2  . OOPHORECTOMY    . SPINE SURGERY  2006, 2008   cervical fusion  . TONSILLECTOMY       Social History   Tobacco Use  . Smoking status: Current Every Day Smoker    Packs/day: 1.00    Years: 35.00    Pack years: 35.00    Types: Cigarettes  . Smokeless tobacco: Never Used  Substance Use Topics  . Alcohol use: No    Alcohol/week: 0.0 standard drinks  . Drug use: No    Family History  Problem Relation Age of Onset  . Lung cancer Father   . Cancer Father   . Depression Mother   . Hypertension Mother   . Breast cancer Maternal Aunt        great aunt  . Lung cancer Paternal Uncle   . Cervical cancer Maternal Grandmother     Allergies  Allergen Reactions  . Asa [Aspirin]     Stomach pain  . Codeine     Other reaction(s): Diarrhea and vomiting (  finding)  .  Cyclobenzaprine Other (See Comments)    Other reaction(s): Altered mental status (finding), Unknown  . Darvocet [Propoxyphene N-Acetaminophen]     Stomach pain  . Effexor [Venlafaxine Hcl] Other (See Comments)    hallucinations  . Erythromycin Other (See Comments)    Other reaction(s): Distress (finding)  . Meperidine-Promethazine     Other reaction(s): Other (qualifier value) Causes internal itching  . Neurontin [Gabapentin]     fatigue  . Nsaids     Bad stomach ache   . Propoxyphene     Other reaction(s): Other (qualifier value) causes constipation  . Mobic [Meloxicam] Palpitations  . Penicillins Rash     REVIEW OF SYSTEMS (Negative unless checked) Constitutional: _0 ?Weight loss_1 ?Fever_2 ?Chills Cardiac:_3 ?Chest pain_4 ? Atrial Fibrillation_5 ?Palpitations _6 ?Shortness of breath when laying flat _7 ?Shortness of breath with exertion. _8 ?Shortness of breath at rest Vascular: _9 ?Pain in legs with walking_10 ?Pain in legswith standing_11 ?Pain in legs when laying flat _12 ?Claudication  _13 ?Pain in feet when laying flat _14 ?History of DVT _15 ?Phlebitis _16 ?Swelling in legs _17 ?Varicose veins _18 ?Non-healing ulcers Pulmonary: _19 ?Uses home oxygen _20 ?Productive cough_21 ?Hemoptysis _22 ?Wheeze _23 ?COPD _24 ?Asthma Neurologic: _25 ?Dizziness_26 ?Seizures _27 ?Blackouts_28 ?History of stroke _29 ?History of TIA_30 ?Aphasia _31 ?Temporary Blindness_32 ?Weaknessor numbness in arm _33 ?Weakness or numbnessin leg Musculoskeletal:_34 ?Joint swelling _35 ?Joint pain _36 ?Low back pain  _37 ? History of Knee Replacement _38 ?Arthritis _39 ?back Surgeries_40 ? Spinal Stenosis  Hematologic:_41 ?Easy bruising_42 ?Easy bleeding _43 ?Hypercoagulable state _44 ?Anemic Gastrointestinal:_45 ?Diarrhea _46 ?Vomiting_47 ?Gastroesophageal reflux/heartburn_48 ?Difficulty swallowing. _49 ?Abdominal pain Genitourinary: _50 ?Chronic kidney disease _51 ?Difficulturination  _52 ?Anuric_53 ?Blood in urine _54 ?Frequenturination _55 ?Burning with urination_56 ?Hematuria Skin: _57 ?Rashes _58 ?Ulcers _59 ?Wounds Psychological: _60 ?History of anxiety_61 ?History of major depression  _62 ? Memory     Physical Examination  BP 114/76 (BP Location: Right Arm)   Pulse 81   Resp 16   Ht _63  (1.575 m)   Wt 175 lb (79.4 kg)   BMI 32.01 kg/m  Gen:  WD/WN, NAD Head: [place]/AT, No temporalis wasting. Ear/Nose/Throat: Hearing grossly intact, nares w/o erythema or drainage Eyes: Conjunctiva clear. Sclera non-icteric Neck: Supple.  Trachea midline Pulmonary:  Good air movement, no use of accessory muscles.  Cardiac: RRR, no JVD Vascular:  Vessel Right Left  Radial Palpable Palpable                          PT Palpable Palpable  DP Palpable Palpable    Musculoskeletal: M/S 5/5 throughout.  No deformity or atrophy.  1-2+ right lower extremity edema, 1+ left lower extremity edema. Neurologic: Sensation grossly intact in extremities.  Symmetrical.  Speech is fluent.  Psychiatric: Judgment and insight appear to be pretty good.  Anxious affect Dermatologic: No rashes or ulcers noted.  Moderate stasis dermatitis changes are present bilaterally       Labs No results found for this or any previous visit (from the past 2160 hour(s)).  Radiology MM 3D SCREEN BREAST BILATERAL  Result Date: 08/27/2019 CLINICAL DATA:  Screening. EXAM: DIGITAL SCREENING BILATERAL MAMMOGRAM WITH TOMO AND CAD COMPARISON:  Previous exam(s). ACR Breast Density Category b: There are scattered areas of fibroglandular density. FINDINGS: There are no findings suspicious for malignancy. Images were processed with CAD. IMPRESSION: No mammographic evidence of malignancy. A result letter of this screening mammogram will be mailed directly to the patient. RECOMMENDATION: Screening mammogram in one year. (Code:SM-B-01Y) BI-RADS CATEGORY  1: Negative. Electronically Signed   By: Ammie Ferrier M.D.    On: 08/27/2019 13:53    Assessment/Plan  Benign essential hypertension blood pressure control important in reducing the progression of atherosclerotic disease. On appropriate oral medications.   Lymphedema She has not been  wearing her compression stockings.  She has not been using her lymphedema pump.  She has not been elevating her legs.  Not surprisingly, her symptoms are worse.  I discussed with her when it comes to lymphedema, conservative therapies are the hallmarks of treatment.  If she does not wear her stockings, elevate her legs, or use her pump she is very unlikely to get better.  We will check a venous reflux study to ensure the venous reflux is not a major contributing factor at this point.  Arterial studies were checked within the past few months and were normal.  I will see her back in a month.    Leotis Pain, MD  09/24/2019 11:44 AM    This note was created with Dragon medical transcription system.  Any errors from dictation are purely unintentional

## 2019-09-24 NOTE — Assessment & Plan Note (Signed)
blood pressure control important in reducing the progression of atherosclerotic disease. On appropriate oral medications.  

## 2019-09-24 NOTE — Assessment & Plan Note (Signed)
She has not been wearing her compression stockings.  She has not been using her lymphedema pump.  She has not been elevating her legs.  Not surprisingly, her symptoms are worse.  I discussed with her when it comes to lymphedema, conservative therapies are the hallmarks of treatment.  If she does not wear her stockings, elevate her legs, or use her pump she is very unlikely to get better.  We will check a venous reflux study to ensure the venous reflux is not a major contributing factor at this point.  Arterial studies were checked within the past few months and were normal.  I will see her back in a month.

## 2019-10-25 ENCOUNTER — Ambulatory Visit (INDEPENDENT_AMBULATORY_CARE_PROVIDER_SITE_OTHER): Payer: Medicare Other

## 2019-10-25 ENCOUNTER — Encounter (INDEPENDENT_AMBULATORY_CARE_PROVIDER_SITE_OTHER): Payer: Self-pay | Admitting: Nurse Practitioner

## 2019-10-25 ENCOUNTER — Other Ambulatory Visit: Payer: Self-pay

## 2019-10-25 ENCOUNTER — Ambulatory Visit (INDEPENDENT_AMBULATORY_CARE_PROVIDER_SITE_OTHER): Payer: Medicare Other | Admitting: Nurse Practitioner

## 2019-10-25 VITALS — BP 134/79 | HR 96 | Resp 12 | Ht 62.0 in | Wt 178.0 lb

## 2019-10-25 DIAGNOSIS — I890 Lymphedema, not elsewhere classified: Secondary | ICD-10-CM

## 2019-10-25 DIAGNOSIS — I10 Essential (primary) hypertension: Secondary | ICD-10-CM

## 2019-10-25 DIAGNOSIS — Z720 Tobacco use: Secondary | ICD-10-CM | POA: Diagnosis not present

## 2019-10-29 ENCOUNTER — Encounter (INDEPENDENT_AMBULATORY_CARE_PROVIDER_SITE_OTHER): Payer: Self-pay | Admitting: Nurse Practitioner

## 2019-10-29 NOTE — Progress Notes (Signed)
SUBJECTIVE:  Patient ID: Sabrina Stanley, female    DOB: [birth year], 60 y.o.   MRN: [id number] Chief Complaint  Patient presents with  . Follow-up    U/S follow up    HPI  Sabrina Stanley is a 59 y.o. female returns to the office for follow-up regarding her bilateral lower extremity edema.  The patient previously had worsening swelling and symptoms in her right lower extremity.  The patient also developed blisters as well and with treatment they slowly improved.  The patient has admitted to not wearing any compression socks, elevating or utilizing her lymphedema pump.  Since her last visit the patient has been actively using her lymphedema pump and wearing compression stockings on a daily basis.  She is also been diligently elevating her lower extremities.  She has also been trying to walk on a regular basis.  The edema has improved greatly in her bilateral lower extremities.  The patient states that her legs feel much better since implementing the conservative therapy.  Today the patient also underwent noninvasive studies.  There was no evidence of DVT or superficial thrombophlebitis bilaterally.  The left lower extremity shows a history of the left great saphenous vein ablation.  The right lower extremity has evidence of reflux in the common femoral vein as well as in the great saphenous vein at the knee.  Past Medical History:  Diagnosis Date  . Abnormal mammogram, unspecified   . Alcoholism ([place])   . Allergic state   . Anemia   . Anxiety   . Arthritis   . Bilateral breast cysts   . Bipolar 1 disorder ([place])   . Bipolar 1 disorder ([place])   . Bone marrow disorder   . Bronchitis   . Cervical stenosis of spine   . Cervicalgia   . Chronic diarrhea   . Chronic fatigue disorder   . Chronic pain   . COPD (chronic obstructive pulmonary disease) ([place])   . Depression   . Depression   . Eczema   . Esophageal reflux   . Fibromyalgia   . GERD (gastroesophageal reflux disease)   . Headache    . Hiatal hernia   . History of esophagogastroduodenoscopy (EGD)   . Hyperlipidemia   . Hyperlipidemia   . Hypertension   . Hypothyroidism   . Insomnia   . Lithium toxicity   . Menstrual irregularity   . Myalgia   . Osteopenia   . Osteopenia   . Panic disorder   . Pleurisy   . Pneumonia   . Psoriasis   . PVD (peripheral vascular disease) ([place])   . Sleep apnea   . Tobacco abuse   . UTI (urinary tract infection)   . Vitamin D deficiency     Past Surgical History:  Procedure Laterality Date  . ABDOMINAL HYSTERECTOMY  1996  . BREAST BIOPSY Right 08-21-12   FIBROADENOMATOUS CHANGE WITH CALCIFICATIONS.   Marland Kitchen BREAST BIOPSY Right    FIBROUS SCARRING, negative for atypia  . COLONOSCOPY    . [place]  . ESOPHAGOGASTRODUODENOSCOPY    . ESOPHAGOGASTRODUODENOSCOPY N/A 09/17/2015   Procedure: ESOPHAGOGASTRODUODENOSCOPY (EGD);  Surgeon: Hulen Luster, MD;  Location: [facility];  Service: Gastroenterology;  Laterality: N/A;  . EYE SURGERY  2010  . KNEE SURGERY Right 1998   meniscus repair  . NASAL SINUS SURGERY  1998, 2000  . NECK SURGERY     x 2  . OOPHORECTOMY    . SPINE  SURGERY  2006, 2008   cervical fusion  . TONSILLECTOMY      Social History   Socioeconomic History  . Marital status: Widowed    Spouse name: Not on file  . Number of children: Not on file  . Years of education: Not on file  . Highest education level: Not on file  Occupational History  . Not on file  Tobacco Use  . Smoking status: Current Every Day Smoker    Packs/day: 1.00    Years: 35.00    Pack years: 35.00    Types: Cigarettes  . Smokeless tobacco: Never Used  Substance and Sexual Activity  . Alcohol use: No    Alcohol/week: 0.0 standard drinks  . Drug use: No  . Sexual activity: Not on file  Other Topics Concern  . Not on file  Social History Narrative  . Not on file   Social Determinants of Health   Financial Resource Strain:   . Difficulty of  Paying Living Expenses: Not on file  Food Insecurity:   . Worried About Charity fundraiser in the Last Year: Not on file  . Ran Out of Food in the Last Year: Not on file  Transportation Needs:   . Lack of Transportation (Medical): Not on file  . Lack of Transportation (Non-Medical): Not on file  Physical Activity:   . Days of Exercise per Week: Not on file  . Minutes of Exercise per Session: Not on file  Stress:   . Feeling of Stress : Not on file  Social Connections:   . Frequency of Communication with Friends and Family: Not on file  . Frequency of Social Gatherings with Friends and Family: Not on file  . Attends Religious Services: Not on file  . Active Member of Clubs or Organizations: Not on file  . Attends Archivist Meetings: Not on file  . Marital Status: Not on file  Intimate Partner Violence:   . Fear of Current or Ex-Partner: Not on file  . Emotionally Abused: Not on file  . Physically Abused: Not on file  . Sexually Abused: Not on file    Family History  Problem Relation Age of Onset  . Lung cancer Father   . Cancer Father   . Depression Mother   . Hypertension Mother   . Breast cancer Maternal Aunt        great aunt  . Lung cancer Paternal Uncle   . Cervical cancer Maternal Grandmother     Allergies  Allergen Reactions  . Asa [Aspirin]     Stomach pain  . Codeine     Other reaction(s): Diarrhea and vomiting (finding)  . Cyclobenzaprine Other (See Comments)    Other reaction(s): Altered mental status (finding), Unknown  . Darvocet [Propoxyphene N-Acetaminophen]     Stomach pain  . Effexor [Venlafaxine Hcl] Other (See Comments)    hallucinations  . Erythromycin Other (See Comments)    Other reaction(s): Distress (finding)  . Meperidine-Promethazine     Other reaction(s): Other (qualifier value) Causes internal itching  . Neurontin [Gabapentin]     fatigue  . Nsaids     Bad stomach ache   . Propoxyphene     Other reaction(s): Other  (qualifier value) causes constipation  . Mobic [Meloxicam] Palpitations  . Penicillins Rash     Review of Systems   Review of Systems: Negative Unless Checked Constitutional: '[]'$ Weight loss  '[]'$ Fever  '[]'$ Chills Cardiac: '[]'$ Chest pain   '[]'$  Atrial Fibrillation  '[]'$ Palpitations   '[]'$   Shortness of breath when laying flat   '[]'$ Shortness of breath with exertion. '[]'$ Shortness of breath at rest Vascular:  '[]'$ Pain in legs with walking   '[]'$ Pain in legs with standing '[]'$ Pain in legs when laying flat   '[]'$ Claudication    '[]'$ Pain in feet when laying flat    '[]'$ History of DVT   '[]'$ Phlebitis   '[x]'$ Swelling in legs   '[x]'$ Varicose veins   '[]'$ Non-healing ulcers Pulmonary:   '[]'$ Uses home oxygen   '[]'$ Productive cough   '[]'$ Hemoptysis   '[]'$ Wheeze  '[]'$ COPD   '[]'$ Asthma Neurologic:  '[]'$ Dizziness   '[]'$ Seizures  '[]'$ Blackouts '[]'$ History of stroke   '[]'$ History of TIA  '[]'$ Aphasia   '[]'$ Temporary Blindness   '[]'$ Weakness or numbness in arm   '[]'$ Weakness or numbness in leg Musculoskeletal:   '[]'$ Joint swelling   '[]'$ Joint pain   '[]'$ Low back pain  '[]'$  History of Knee Replacement '[]'$ Arthritis '[]'$ back Surgeries  '[]'$  Spinal Stenosis    Hematologic:  '[]'$ Easy bruising  '[]'$ Easy bleeding   '[]'$ Hypercoagulable state   '[]'$ Anemic Gastrointestinal:  '[]'$ Diarrhea   '[]'$ Vomiting  '[]'$ Gastroesophageal reflux/heartburn   '[]'$ Difficulty swallowing. '[]'$ Abdominal pain Genitourinary:  '[]'$ Chronic kidney disease   '[]'$ Difficult urination  '[]'$ Anuric   '[]'$ Blood in urine '[]'$ Frequent urination  '[]'$ Burning with urination   '[]'$ Hematuria Skin:  '[]'$ Rashes   '[]'$ Ulcers '[]'$ Wounds Psychological:  '[x]'$ History of anxiety   '[]'$  History of major depression  '[]'$  Memory Difficulties      OBJECTIVE:   Physical Exam  BP 134/79   Pulse 96   Resp 12   Ht '5\' 2"'$  (1.575 m)   Wt 178 lb (80.7 kg)   BMI 32.56 kg/m   Gen: WD/WN, NAD Head: [place]/AT, No temporalis wasting.  Ear/Nose/Throat: Hearing grossly intact, nares w/o erythema or drainage Eyes: PER, EOMI, sclera nonicteric.  Neck: Supple, no masses.  No JVD.  Pulmonary:  Good  air movement, no use of accessory muscles.  Cardiac: RRR Vascular:  Minimal edema bilaterally.  Scattered varicosities Vessel Right Left  Radial Palpable Palpable   Gastrointestinal: soft, non-distended. No guarding/no peritoneal signs.  Musculoskeletal: M/S 5/5 throughout.  No deformity or atrophy.  Neurologic: Pain and light touch intact in extremities.  Symmetrical.  Speech is fluent. Motor exam as listed above. Psychiatric: Judgment intact, Mood & affect appropriate for pt's clinical situation. Dermatologic: No Venous rashes. No Ulcers Noted.  No changes consistent with cellulitis. Lymph : No Cervical lymphadenopathy, no lichenification or skin changes of chronic lymphedema.       ASSESSMENT AND PLAN:  1. Lymphedema of both lower extremities  No surgery or intervention at this point in time.    I have reviewed my discussion with the patient regarding lymphedema and why it  causes symptoms.  Patient will continue wearing graduated compression stockings class 1 (20-30 mmHg) on a daily basis a prescription was given. The patient is reminded to put the stockings on first thing in the morning and removing them in the evening. The patient is instructed specifically not to sleep in the stockings.   In addition, behavioral modification throughout the day will be continued.  This will include frequent elevation (such as in a recliner), use of over the counter pain medications as needed and exercise such as walking.  I have reviewed systemic causes for chronic edema such as liver, kidney and cardiac etiologies and there does not appear to be any significant changes in these organ systems over the past year.  The patient is under the impression that these organ systems are all stable and unchanged.  The patient will continue aggressive use of the  lymph pump.  This will continue to improve the edema control and prevent sequela such as ulcers and infections.      2. Tobacco abuse Smoking  cessation was discussed, 3-10 minutes spent on this topic specifically   3. Benign essential hypertension Continue antihypertensive medications as already ordered, these medications have been reviewed and there are no changes at this time.    Current Outpatient Medications on File Prior to Visit  Medication Sig Dispense Refill  . ABILIFY 5 MG tablet 5 mg daily.     Marland Kitchen atorvastatin (LIPITOR) 10 MG tablet     . Biotin 5 MG CAPS Take 1,000 mg by mouth daily.    . cyanocobalamin (,VITAMIN B-12,) 1000 MCG/ML injection Inject into the muscle.    . diclofenac sodium (VOLTAREN) 1 % GEL Apply topically.    . divalproex (DEPAKOTE ER) 500 MG 24 hr tablet     . doxycycline (VIBRA-TABS) 100 MG tablet Take 100 mg by mouth 2 (two) times daily.    Marland Kitchen FLUoxetine (PROZAC) 20 MG capsule Take 60 mg by mouth daily.     . Fluticasone-Salmeterol (ADVAIR) 100-50 MCG/DOSE AEPB Inhale 1 puff into the lungs daily as needed.    . Fluticasone-Salmeterol (ADVAIR) 100-50 MCG/DOSE AEPB Inhale into the lungs.    . furosemide (LASIX) 20 MG tablet Take by mouth.    . Meth-Hyo-M Bl-Na Phos-Ph Sal (URIBEL) 118 MG CAPS Take by mouth.    . morphine (MS CONTIN) 15 MG 12 hr tablet Limit 2-3 tabs by mouth per day if tolerated 90 tablet 0  . naloxone (NARCAN) 4 MG/0.1ML LIQD nasal spray kit Place into the nose.    Marland Kitchen omeprazole (PRILOSEC) 40 MG capsule Take 40 mg by mouth daily.    . ondansetron (ZOFRAN-ODT) 4 MG disintegrating tablet     . oxyCODONE-acetaminophen (PERCOCET) 10-325 MG tablet Take 1 tablet by mouth every 4 (four) hours as needed.    Marland Kitchen oxyCODONE-acetaminophen (PERCOCET) 7.5-325 MG tablet Take 1 tablet by mouth every 4 (four) hours as needed for severe pain.    . potassium chloride SA (K-DUR,KLOR-CON) 20 MEQ tablet Take 20 mEq by mouth daily.    . predniSONE (DELTASONE) 10 MG tablet Taper 6-6-4-4-2-2-1-1-off    . primidone (MYSOLINE) 50 MG tablet Take 50 mg by mouth 2 (two) times daily.    . primidone (MYSOLINE) 50  MG tablet Take by mouth.    . ranitidine (ZANTAC) 300 MG tablet Take 300 mg by mouth at bedtime.    Marland Kitchen rOPINIRole (REQUIP) 1 MG tablet Take 1 mg by mouth at bedtime.     . SUMAtriptan (IMITREX) 100 MG tablet Take 100 mg by mouth every 2 (two) hours as needed for migraine.    Marland Kitchen tiZANidine (ZANAFLEX) 4 MG tablet TAKE 1 TABLET BY MOUTH NIGHTLY    . topiramate (TOPAMAX) 100 MG tablet Take 100 mg by mouth 2 (two) times daily.    Marland Kitchen topiramate (TOPAMAX) 200 MG tablet Take 200 mg by mouth 2 (two) times daily.    . vitamin C (ASCORBIC ACID) 500 MG tablet Take 500 mg by mouth daily.    . Vitamin D, Ergocalciferol, (DRISDOL) 50000 units CAPS capsule     . zolpidem (AMBIEN) 10 MG tablet Take 10 mg by mouth daily.     Marland Kitchen zolpidem (AMBIEN) 10 MG tablet TAKE 1 TABLET BY MOUTH NIGHTLY AS NEEDEDFOR SLEEP     Current Facility-Administered Medications on File Prior  to Visit  Medication Dose Route Frequency Provider Last Rate Last Admin  . lidocaine (PF) (XYLOCAINE) 1 % injection 10 mL  10 mL Subcutaneous Once Mohammed Kindle, MD      . midazolam (VERSED) 5 MG/5ML injection 5 mg  5 mg Intravenous Once Mohammed Kindle, MD      . orphenadrine (NORFLEX) injection 60 mg  60 mg Intramuscular Once Mohammed Kindle, MD        There are no Patient Instructions on file for this visit. No follow-ups on file.   Kris Hartmann, NP  This note was completed with Sales executive.  Any errors are purely unintentional.

## 2019-11-29 ENCOUNTER — Encounter: Payer: Self-pay | Admitting: Dermatology

## 2019-11-29 ENCOUNTER — Ambulatory Visit (INDEPENDENT_AMBULATORY_CARE_PROVIDER_SITE_OTHER): Payer: Medicare Other | Admitting: Dermatology

## 2019-11-29 ENCOUNTER — Other Ambulatory Visit: Payer: Self-pay

## 2019-11-29 DIAGNOSIS — I872 Venous insufficiency (chronic) (peripheral): Secondary | ICD-10-CM

## 2019-11-29 DIAGNOSIS — L853 Xerosis cutis: Secondary | ICD-10-CM

## 2019-11-29 DIAGNOSIS — L821 Other seborrheic keratosis: Secondary | ICD-10-CM | POA: Diagnosis not present

## 2019-11-29 MED ORDER — CLOBETASOL PROPIONATE 0.05 % EX OINT: 1.0000 "application " | TOPICAL_OINTMENT | Freq: Two times a day (BID) | CUTANEOUS | 1 refills | Status: DC

## 2019-11-29 NOTE — Patient Instructions (Signed)
Topical steroids (such as triamcinolone, fluocinolone, fluocinonide, mometasone, clobetasol, halobetasol, betamethasone, hydrocortisone) can cause thinning and lightening of the skin if they are used for too long in the same area. Your physician has selected the right strength medicine for your problem and area affected on the body. Please use your medication only as directed by your physician to prevent side effects.   Recommend gold bond rapid relief up to 3 times per day to help with itch relief  Stasis Dermatitis Stasis dermatitis is a long-term (chronic) skin condition that happens when veins can no longer pump blood back to the heart (poor circulation). This condition causes a red or brown scaly rash or sores (ulcers) from the pooling of blood (stasis). This condition usually affects the lower legs. It may affect one leg or both legs. Without treatment, severe stasis dermatitis can lead to other skin conditions and infections. What are the causes? This condition is caused by poor circulation. What increases the risk? You are more likely to develop this condition if:  You are not very active.  You stand for long periods of time.  You have veins that have become enlarged and twisted (varicose veins).  You have leg veins that are not strong enough to send blood back to the heart (venous insufficiency).  You have had a blood clot.  You have been pregnant many times.  You have had vein surgery.  You are obese.  You have heart or kidney failure.  You are 43 years of age or older.  You have had injuries to your legs in the past. What are the signs or symptoms? Common early symptoms of this condition include:  Itchiness in one or both of your legs.  Swelling in your ankle or leg. This might get better overnight but be worse again during the day.  Skin that looks thin on your ankle and leg.  Red or brown marks that develop slowly.  Skin that is dry, cracked, or easily  irritated.  Red, swollen skin that is sore or has a burning feeling.  An achy or heavy feeling after you walk or stand for long periods of time.  Pain. Later and more severe symptoms of this condition include:  Skin that looks shiny.  Small, open sores (ulcers). These are often red or purple and leak fluid.  Skin that feels hard.  Severe itching.  A change in the shape or color of your lower legs.  Severe pain.  Difficulty walking. How is this diagnosed? This condition may be diagnosed based on:  Your symptoms and medical history.  A physical exam. You may also have tests, including:  Blood tests.  Imaging tests to check blood flow (Doppler ultrasound).  Allergy tests. You may need to see a health care provider who specializes in skin diseases (dermatologist). How is this treated? This condition may be treated with:  Compression stockings or an elastic wrap to improve circulation.  Medicines, such as: ? Corticosteroid creams and ointments. ? Non-corticosteroid medicines applied to the skin (topical). ? Medicine to reduce swelling in the legs (diuretics). ? Antibiotics. ? Medicine to relieve itching (antihistamines).  A bandage (dressing).  A wrap that contains zinc and gelatin (Unna boot). Follow these instructions at home: Skin care  Moisturize your skin as told by your health care provider. Do not use moisturizers with fragrance. This can irritate your skin.  Apply a cool, wet cloth (cool compress) to the affected areas.  Do not scratch your skin.  Do not  rub your skin dry after a bath or shower. Gently pat your skin dry.  Do not use scented soaps, detergents, or perfumes. Medicines  Take or use over-the-counter and prescription medicines only as told by your health care provider.  If you were prescribed an antibiotic medicine, take or use it as told by your health care provider. Do not stop taking or using the antibiotic even if your condition  improves. Activity  Walk as told by your health care provider. Walking increases blood flow.  Do calf and ankle exercises throughout the day as told by your health care provider. This will help increase blood flow.  Raise (elevate) your legs above the level of your heart when you are sitting or lying down. Lifestyle  Work with your health care provider to lose weight, if needed.  Do not cross your legs when you sit.  Do not stand or sit in one position for long periods of time.  Wear comfortable, loose-fitting clothing. Circulation in your legs will be worse if you wear tight pants, belts, and waistbands.  Do not use any products that contain nicotine or tobacco, such as cigarettes, e-cigarettes, and chewing tobacco. If you need help quitting, ask your health care provider. General instructions  If you were asked to use one of the following to help with your condition, follow instructions from your health care provider on how to: ? Remove and change any dressing. ? Wear compression stockings. These stockings help to prevent blood clots and reduce swelling in your legs. ? Wear the [employer].  Keep all follow-up visits as told by your health care provider. This is important. Contact a health care provider if:  Your condition does not improve with treatment.  Your condition gets worse.  You have signs of infection in the affected area. Watch for: ? Swelling. ? Tenderness. ? Redness. ? Soreness. ? Warmth.  You have a fever. Get help right away if:  You notice red streaks coming from the affected area.  Your bone or joint underneath the affected area becomes painful after the skin has healed.  The affected area turns darker.  You feel a deep pain in your leg or groin.  You are short of breath. Summary  Stasis dermatitis is a long-term (chronic) skin condition that happens when veins can no longer pump blood back to the heart (poor circulation).  Wear compression  stockings as told by your health care provider. These stockings help to prevent blood clots and reduce swelling in your legs.  Follow instructions from your health care provider about activity, medicines, and lifestyle.  Contact a health care provider if you have a fever or have signs of infection in the affected area.  Keep all follow-up visits as told by your health care provider. This is important. This information is not intended to replace advice given to you by your health care provider. Make sure you discuss any questions you have with your health care provider. Document Revised: 01/15/2018 Document Reviewed: 01/15/2018 Elsevier Patient Education  [place].

## 2019-11-29 NOTE — Progress Notes (Signed)
   Follow-Up Visit   Subjective  Sabrina Stanley is a 59 y.o. female who presents for the following: rash with itch (B/L lower legs for past year or 2, using Curel itch defense.). She denies prescription treatments.   The following portions of the chart were reviewed this encounter and updated as appropriate: Tobacco  Allergies  Meds  Problems  Med Hx  Surg Hx  Fam Hx      Review of Systems: No other skin or systemic complaints.  Objective  Well appearing patient in no apparent distress; mood and affect are within normal limits.  A focused examination was performed including lower extremities, including the legs, feet, and chest. Relevant physical exam findings are noted in the Assessment and Plan.  Objective  Chest - Medial [facility]): Stuck-on, waxy, tan-brown papules and plaques -- Discussed benign etiology and prognosis.   Objective  bilateral Lower Leg - Anterior: Erythematous and hyperpigmented scaly patches b/l lower legs with 1 plus pitting edema  Assessment & Plan  Seborrheic keratosis Chest - Medial ([place])  Benign-appearing.  Observation.  Call clinic for new or changing spots. Recommend daily use of broad spectrum spf 30+ sunscreen to sun-exposed areas.     Stasis dermatitis of both legs bilateral Lower Leg - Anterior  Chronic, flared.  Patient reports normal arterial blood flow. She has been followed by vascular surgery  Recommend Compression stockings 20-30 mmhg or higher daily (patient declines prescription today).  Recommend Gold Bond Rapid Relief Anti-Itch cream up to 3 times per day to areas that are itchy.  Start Clobetasol twice a day as needed up to 3 weeks. Avoid applying to face, groin, and axilla. Use as directed. Risk of skin atrophy with long-term use reviewed.   Will consider transition to tacrolimus at follow-up  clobetasol ointment (TEMOVATE) 0.05 % - bilateral Lower Leg - Anterior  Xerosis cutis (2) Right Foot - Anterior; Right  Lower Leg - Anterior  Return in about 4 weeks (around 12/27/2019) for stasis derm.   IDonzetta Kohut, CMA, am acting as scribe for Forest Gleason, MD .  Documentation: I have reviewed the above documentation for accuracy and completeness, and I agree with the above.  Forest Gleason, MD

## 2019-12-10 ENCOUNTER — Encounter: Payer: Self-pay | Admitting: Dermatology

## 2019-12-31 ENCOUNTER — Ambulatory Visit (INDEPENDENT_AMBULATORY_CARE_PROVIDER_SITE_OTHER): Payer: Medicare Other | Admitting: Vascular Surgery

## 2020-01-03 ENCOUNTER — Ambulatory Visit: Payer: Medicare Other | Admitting: Dermatology

## 2020-04-09 ENCOUNTER — Other Ambulatory Visit (HOSPITAL_COMMUNITY): Payer: Self-pay | Admitting: Internal Medicine

## 2020-04-09 ENCOUNTER — Other Ambulatory Visit: Payer: Self-pay | Admitting: Internal Medicine

## 2020-04-09 DIAGNOSIS — R270 Ataxia, unspecified: Secondary | ICD-10-CM

## 2020-04-16 ENCOUNTER — Ambulatory Visit: Payer: Medicare Other | Admitting: Dermatology

## 2020-04-24 ENCOUNTER — Ambulatory Visit (INDEPENDENT_AMBULATORY_CARE_PROVIDER_SITE_OTHER): Payer: Medicare Other | Admitting: Vascular Surgery

## 2020-04-29 ENCOUNTER — Ambulatory Visit: Payer: Medicare Other

## 2020-05-26 ENCOUNTER — Ambulatory Visit (INDEPENDENT_AMBULATORY_CARE_PROVIDER_SITE_OTHER): Payer: Medicare Other | Admitting: Vascular Surgery

## 2020-05-29 DIAGNOSIS — U071 COVID-19: Secondary | ICD-10-CM

## 2020-05-29 HISTORY — DX: COVID-19: U07.1

## 2020-06-15 ENCOUNTER — Other Ambulatory Visit: Payer: Self-pay | Admitting: Internal Medicine

## 2020-06-15 DIAGNOSIS — Z1231 Encounter for screening mammogram for malignant neoplasm of breast: Secondary | ICD-10-CM

## 2020-07-30 LAB — COLOGUARD
COLOGUARD: NEGATIVE
COLOGUARD: NEGATIVE

## 2020-09-18 ENCOUNTER — Ambulatory Visit
Admission: RE | Admit: 2020-09-18 | Discharge: 2020-09-18 | Disposition: A | Discharge status: Home / Self Care | Payer: Medicare Other | Source: Ambulatory Visit | Attending: Internal Medicine | Admitting: Internal Medicine

## 2020-09-18 ENCOUNTER — Other Ambulatory Visit: Payer: Self-pay

## 2020-09-18 DIAGNOSIS — Z1231 Encounter for screening mammogram for malignant neoplasm of breast: Secondary | ICD-10-CM

## 2020-11-09 ENCOUNTER — Other Ambulatory Visit: Payer: Self-pay

## 2020-11-09 ENCOUNTER — Encounter: Payer: Self-pay | Admitting: Ophthalmology

## 2020-11-11 NOTE — Discharge Instructions (Signed)

## 2020-11-12 ENCOUNTER — Other Ambulatory Visit
Admission: RE | Admit: 2020-11-12 | Discharge: 2020-11-12 | Disposition: A | Discharge status: Home / Self Care | Payer: Medicare Other | Source: Ambulatory Visit | Attending: Ophthalmology | Admitting: Ophthalmology

## 2020-11-12 ENCOUNTER — Other Ambulatory Visit: Payer: Self-pay

## 2020-11-12 DIAGNOSIS — Z20822 Contact with and (suspected) exposure to covid-19: Secondary | ICD-10-CM | POA: Insufficient documentation

## 2020-11-12 DIAGNOSIS — Z01812 Encounter for preprocedural laboratory examination: Secondary | ICD-10-CM | POA: Diagnosis present

## 2020-11-12 LAB — SARS CORONAVIRUS 2 (TAT 6-24 HRS): SARS Coronavirus 2: NEGATIVE

## 2020-11-16 ENCOUNTER — Ambulatory Visit: Payer: Medicare Other | Admitting: Anesthesiology

## 2020-11-16 ENCOUNTER — Encounter
Admission: RE | Disposition: A | Discharge status: Home / Self Care | Payer: Self-pay | Source: Home / Self Care | Attending: Ophthalmology

## 2020-11-16 ENCOUNTER — Encounter: Payer: Self-pay | Admitting: Ophthalmology

## 2020-11-16 ENCOUNTER — Ambulatory Visit
Admission: RE | Admit: 2020-11-16 | Discharge: 2020-11-16 | Disposition: A | Discharge status: Home / Self Care | Payer: Medicare Other | Attending: Ophthalmology | Admitting: Ophthalmology

## 2020-11-16 DIAGNOSIS — Z888 Allergy status to other drugs, medicaments and biological substances status: Secondary | ICD-10-CM | POA: Insufficient documentation

## 2020-11-16 DIAGNOSIS — Z881 Allergy status to other antibiotic agents status: Secondary | ICD-10-CM | POA: Insufficient documentation

## 2020-11-16 DIAGNOSIS — F17210 Nicotine dependence, cigarettes, uncomplicated: Secondary | ICD-10-CM | POA: Insufficient documentation

## 2020-11-16 DIAGNOSIS — Z79899 Other long term (current) drug therapy: Secondary | ICD-10-CM | POA: Insufficient documentation

## 2020-11-16 DIAGNOSIS — H2511 Age-related nuclear cataract, right eye: Secondary | ICD-10-CM | POA: Insufficient documentation

## 2020-11-16 DIAGNOSIS — Z91011 Allergy to milk products: Secondary | ICD-10-CM | POA: Diagnosis not present

## 2020-11-16 DIAGNOSIS — Z8616 Personal history of COVID-19: Secondary | ICD-10-CM | POA: Insufficient documentation

## 2020-11-16 DIAGNOSIS — Z818 Family history of other mental and behavioral disorders: Secondary | ICD-10-CM | POA: Diagnosis not present

## 2020-11-16 DIAGNOSIS — Z8249 Family history of ischemic heart disease and other diseases of the circulatory system: Secondary | ICD-10-CM | POA: Diagnosis not present

## 2020-11-16 DIAGNOSIS — Z886 Allergy status to analgesic agent status: Secondary | ICD-10-CM | POA: Insufficient documentation

## 2020-11-16 DIAGNOSIS — Z803 Family history of malignant neoplasm of breast: Secondary | ICD-10-CM | POA: Insufficient documentation

## 2020-11-16 DIAGNOSIS — Z880 Allergy status to penicillin: Secondary | ICD-10-CM | POA: Insufficient documentation

## 2020-11-16 DIAGNOSIS — Z801 Family history of malignant neoplasm of trachea, bronchus and lung: Secondary | ICD-10-CM | POA: Diagnosis not present

## 2020-11-16 DIAGNOSIS — Z807 Family history of other malignant neoplasms of lymphoid, hematopoietic and related tissues: Secondary | ICD-10-CM | POA: Insufficient documentation

## 2020-11-16 HISTORY — DX: Presence of dental prosthetic device (complete) (partial): Z97.2

## 2020-11-16 HISTORY — PX: CATARACT EXTRACTION W/PHACO: SHX586

## 2020-11-16 HISTORY — DX: Polyneuropathy, unspecified: G62.9

## 2020-11-16 SURGERY — PHACOEMULSIFICATION, CATARACT, WITH IOL INSERTION
Anesthesia: Monitor Anesthesia Care | Site: Eye | Laterality: Right

## 2020-11-16 MED ORDER — MOXIFLOXACIN HCL 0.5 % OP SOLN
OPHTHALMIC | Status: DC | PRN
  Administered 2020-11-16: 0.2 mL via OPHTHALMIC

## 2020-11-16 MED ORDER — PHENYLEPHRINE HCL 10 % OP SOLN
1.0000 [drp] | OPHTHALMIC | Status: DC | PRN
  Administered 2020-11-16 (×3): 1 [drp] via OPHTHALMIC

## 2020-11-16 MED ORDER — FENTANYL CITRATE (PF) 100 MCG/2ML IJ SOLN
INTRAMUSCULAR | Status: DC | PRN
  Administered 2020-11-16: 50 ug via INTRAVENOUS

## 2020-11-16 MED ORDER — LACTATED RINGERS IV SOLN: INTRAVENOUS | Status: DC

## 2020-11-16 MED ORDER — ACETAMINOPHEN 160 MG/5ML PO SOLN: 325.0000 mg | Freq: Once | ORAL | Status: DC

## 2020-11-16 MED ORDER — LIDOCAINE HCL (PF) 2 % IJ SOLN
INTRAOCULAR | Status: DC | PRN
  Administered 2020-11-16: 1 mL via INTRAOCULAR

## 2020-11-16 MED ORDER — TETRACAINE HCL 0.5 % OP SOLN
1.0000 [drp] | OPHTHALMIC | Status: DC | PRN
  Administered 2020-11-16 (×3): 1 [drp] via OPHTHALMIC

## 2020-11-16 MED ORDER — CYCLOPENTOLATE HCL 2 % OP SOLN
1.0000 [drp] | OPHTHALMIC | Status: DC | PRN
  Administered 2020-11-16 (×3): 1 [drp] via OPHTHALMIC

## 2020-11-16 MED ORDER — MIDAZOLAM HCL 2 MG/2ML IJ SOLN
INTRAMUSCULAR | Status: DC | PRN
  Administered 2020-11-16: 1 mg via INTRAVENOUS

## 2020-11-16 MED ORDER — SODIUM HYALURONATE 23 MG/ML IO SOLN
INTRAOCULAR | Status: DC | PRN
  Administered 2020-11-16: 0.6 mL via INTRAOCULAR

## 2020-11-16 MED ORDER — ACETAMINOPHEN 325 MG PO TABS: 325.0000 mg | ORAL_TABLET | Freq: Once | ORAL | Status: DC

## 2020-11-16 MED ORDER — SODIUM HYALURONATE 10 MG/ML IO SOLN
INTRAOCULAR | Status: DC | PRN
  Administered 2020-11-16: 0.55 mL via INTRAOCULAR

## 2020-11-16 MED ORDER — EPINEPHRINE PF 1 MG/ML IJ SOLN
INTRAOCULAR | Status: DC | PRN
  Administered 2020-11-16: 63 mL via OPHTHALMIC

## 2020-11-16 SURGICAL SUPPLY — 18 items
CANNULA ANT/CHMB 27G (MISCELLANEOUS) ×2 IMPLANT
CANNULA ANT/CHMB 27GA (MISCELLANEOUS) ×4 IMPLANT
DISSECTOR HYDRO NUCLEUS 50X22 (MISCELLANEOUS) ×2 IMPLANT
GLOVE SURG ENC MOIS LTX SZ7.5 (GLOVE) ×2 IMPLANT
GLOVE SURG SYN 8.5  E (GLOVE) ×2
GLOVE SURG SYN 8.5 E (GLOVE) ×1 IMPLANT
GLOVE SURG SYN 8.5 PF PI (GLOVE) ×1 IMPLANT
GOWN STRL REUS W/ TWL LRG LVL3 (GOWN DISPOSABLE) ×2 IMPLANT
GOWN STRL REUS W/TWL LRG LVL3 (GOWN DISPOSABLE) ×4
LENS IOL TECNIS EYHANCE 21.0 (Intraocular Lens) ×1 IMPLANT
MARKER SKIN DUAL TIP RULER LAB (MISCELLANEOUS) ×2 IMPLANT
PACK DR. KING ARMS (PACKS) ×2 IMPLANT
PACK EYE AFTER SURG (MISCELLANEOUS) ×2 IMPLANT
PACK OPTHALMIC (MISCELLANEOUS) ×2 IMPLANT
SYR 3ML LL SCALE MARK (SYRINGE) ×2 IMPLANT
SYR TB 1ML LUER SLIP (SYRINGE) ×2 IMPLANT
WATER STERILE IRR 250ML POUR (IV SOLUTION) ×2 IMPLANT
WIPE NON LINTING 3.25X3.25 (MISCELLANEOUS) ×2 IMPLANT

## 2020-11-16 NOTE — H&P (Signed)
[facility]   Primary Care Physician:  Idelle Crouch, MD Ophthalmologist: Dr. Benay Pillow  Pre-Procedure History & Physical: HPI:  Sabrina Stanley is a 60 y.o. female here for cataract surgery.   Past Medical History:  Diagnosis Date  . Abnormal mammogram, unspecified   . Alcoholism ([place])   . Allergic state   . Anemia   . Anxiety   . Arthritis   . Bilateral breast cysts   . Bipolar 1 disorder ([place])   . Bipolar 1 disorder ([place])   . Bone marrow disorder   . Bronchitis   . Cervical stenosis of spine   . Cervicalgia   . Chronic diarrhea   . Chronic fatigue disorder   . Chronic pain   . COPD (chronic obstructive pulmonary disease) ([place])   . COVID-19 05/2020  . Depression   . Depression   . Eczema   . Esophageal reflux   . Fibromyalgia   . GERD (gastroesophageal reflux disease)   . Headache   . Hiatal hernia   . History of esophagogastroduodenoscopy (EGD)   . Hyperlipidemia   . Hyperlipidemia   . Hypertension   . Hypothyroidism   . Insomnia   . Lithium toxicity   . Menstrual irregularity   . Myalgia   . Neuropathy    lower legs  . Osteopenia   . Osteopenia   . Panic disorder   . Pleurisy   . Pneumonia   . Psoriasis   . PVD (peripheral vascular disease) ([place])   . Sleep apnea    No CPAP  . Tobacco abuse   . UTI (urinary tract infection)   . Vitamin D deficiency   . Wears dentures    partial lower    Past Surgical History:  Procedure Laterality Date  . ABDOMINAL HYSTERECTOMY  1996  . BREAST BIOPSY Right 08-21-12   FIBROADENOMATOUS CHANGE WITH CALCIFICATIONS.   Marland Kitchen BREAST BIOPSY Right    FIBROUS SCARRING, negative for atypia  . COLONOSCOPY    . [place]  . ESOPHAGOGASTRODUODENOSCOPY    . ESOPHAGOGASTRODUODENOSCOPY N/A 09/17/2015   Procedure: ESOPHAGOGASTRODUODENOSCOPY (EGD);  Surgeon: Hulen Luster, MD;  Location: [facility] ENDOSCOPY;  Service: Gastroenterology;  Laterality: N/A;  . EYE SURGERY  2010  . KNEE SURGERY  Right 1998   meniscus repair  . NASAL SINUS SURGERY  1998, 2000  . NECK SURGERY     x 2  . OOPHORECTOMY    . SPINE SURGERY  2006, 2008   cervical fusion  . TONSILLECTOMY      Prior to Admission medications   Medication Sig Start Date End Date Taking? Authorizing Provider  ABILIFY 5 MG tablet 7.5 mg daily. 01/30/13  Yes [provider]  atorvastatin (LIPITOR) 10 MG tablet  08/02/17  Yes [provider]  benzonatate (TESSALON) 200 MG capsule Take 200 mg by mouth 3 (three) times daily as needed for cough.   Yes [provider]  Biotin 5 MG CAPS Take 1,000 mg by mouth daily.   Yes [provider]  bisoprolol-hydrochlorothiazide (ZIAC) 2.5-6.25 MG tablet Take 1 tablet by mouth daily.   Yes [provider]  clobetasol ointment (TEMOVATE) 9.14 % Apply 1 application topically 2 (two) times daily. Use up to 3 weeks then prn 11/29/19  Yes Moye, [place], MD  cyanocobalamin (,VITAMIN B-12,) 1000 MCG/ML injection Inject into the muscle. 12/14/15  Yes [provider]  diclofenac sodium (VOLTAREN) 1 % GEL Apply topically. 05/17/19  Yes [provider]  divalproex (DEPAKOTE ER) 500 MG 24 hr tablet  06/26/17  Yes [provider]  FLUoxetine (PROZAC) 20 MG capsule Take 60 mg by mouth daily.    Yes [provider]  Fluticasone-Salmeterol (ADVAIR) 100-50 MCG/DOSE AEPB Inhale 1 puff into the lungs daily as needed.   Yes [provider]  furosemide (LASIX) 20 MG tablet Take by mouth. 05/24/19 11/16/20 Yes [provider]  Meth-Hyo-M Bl-Na Phos-Ph Sal (URIBEL) 118 MG CAPS Take by mouth.   Yes [provider]  morphine (MS CONTIN) 15 MG 12 hr tablet Limit 2-3 tabs by mouth per day if tolerated 04/20/16  Yes Mohammed Kindle, MD  oxyCODONE-acetaminophen (PERCOCET) 10-325 MG tablet Take 1 tablet by mouth every 4 (four) hours as needed. 10/07/19  Yes [provider]  oxyCODONE-acetaminophen (PERCOCET) 7.5-325 MG  tablet Take 1 tablet by mouth every 4 (four) hours as needed for severe pain.   Yes [provider]  potassium chloride SA (K-DUR,KLOR-CON) 20 MEQ tablet Take 20 mEq by mouth daily.   Yes [provider]  primidone (MYSOLINE) 50 MG tablet Take 50 mg by mouth 2 (two) times daily.   Yes [provider]  rOPINIRole (REQUIP) 1 MG tablet Take 1 mg by mouth at bedtime.    Yes [provider]  SUMAtriptan (IMITREX) 100 MG tablet Take 100 mg by mouth every 2 (two) hours as needed for migraine.   Yes [provider]  tiZANidine (ZANAFLEX) 4 MG tablet TAKE 1 TABLET BY MOUTH NIGHTLY 12/11/18  Yes [provider]  topiramate (TOPAMAX) 200 MG tablet Take 200 mg by mouth daily. 10/16/19  Yes [provider]  Vitamin D, Ergocalciferol, (DRISDOL) 50000 units CAPS capsule  08/28/17  Yes [provider]  naloxone (NARCAN) 4 MG/0.1ML LIQD nasal spray kit Place into the nose.    [provider]  ondansetron (ZOFRAN-ODT) 4 MG disintegrating tablet  05/31/19   [provider]    Allergies as of 09/29/2020 - Review Complete 12/10/2019  Allergen Reaction Noted  . Asa [aspirin]  12/27/2012  . Codeine    . Cyclobenzaprine Other (See Comments) 01/06/2014  . Darvocet [propoxyphene n-acetaminophen]  12/27/2012  . Effexor [venlafaxine hcl] Other (See Comments) 12/27/2012  . Erythromycin Other (See Comments) 01/06/2014  . Meperidine-promethazine    . Neurontin [gabapentin]  12/27/2012  . Nsaids  09/02/2013  . Propoxyphene    . Mobic [meloxicam] Palpitations 12/27/2012  . Penicillins Rash 12/27/2012    Family History  Problem Relation Age of Onset  . Lung cancer Father   . Cancer Father   . Depression Mother   . Hypertension Mother   . Breast cancer Maternal Aunt        great aunt  . Lung cancer Paternal Uncle   . Cervical cancer Maternal Grandmother     Social History   Socioeconomic History  . Marital status: Widowed     Spouse name: Not on file  . Number of children: Not on file  . Years of education: Not on file  . Highest education level: Not on file  Occupational History  . Not on file  Tobacco Use  . Smoking status: Current Every Day Smoker    Packs/day: 1.00    Years: 35.00    Pack years: 35.00    Types: Cigarettes  . Smokeless tobacco: Never Used  . Tobacco comment: started age 85  Vaping Use  . Vaping Use: Former  Substance and Sexual Activity  .  Alcohol use: No    Alcohol/week: 0.0 standard drinks  . Drug use: No  . Sexual activity: Not on file  Other Topics Concern  . Not on file  Social History Narrative  . Not on file   Social Determinants of Health   Financial Resource Strain: Not on file  Food Insecurity: Not on file  Transportation Needs: Not on file  Physical Activity: Not on file  Stress: Not on file  Social Connections: Not on file  Intimate Partner Violence: Not on file    Review of Systems: See HPI, otherwise negative ROS  Physical Exam: BP 133/75   Pulse 79   Temp (!) 97.1 F (36.2 C) (Temporal)   Resp 16   Ht _0  (1.575 m)   Wt 79.8 kg   SpO2 100%   BMI 32.19 kg/m  General:   Alert,  pleasant and cooperative in NAD Head:  Normocephalic and atraumatic. Respiratory:  Normal work of breathing.  Impression/Plan: Sabrina Stanley is here for cataract surgery.  Risks, benefits, limitations, and alternatives regarding cataract surgery have been reviewed with the patient.  Questions have been answered.  All parties agreeable.   Benay Pillow, MD  11/16/2020, 9:52 AM

## 2020-11-16 NOTE — Op Note (Signed)
OPERATIVE NOTE  Sabrina Stanley [id number] 11/16/2020   PREOPERATIVE DIAGNOSIS:  Nuclear sclerotic cataract right eye.  H25.11   POSTOPERATIVE DIAGNOSIS:    Nuclear sclerotic cataract right eye.     PROCEDURE:  Phacoemusification with posterior chamber intraocular lens placement of the right eye   LENS:   Implant Name Type Inv. Item Serial No. Manufacturer Lot No. LRB No. Used Action  LENS IOL TECNIS EYHANCE 21.0 - [id number] Intraocular Lens LENS IOL TECNIS EYHANCE 21.0 [id number] JOHNSON   Right 1 Implanted       Procedure(s) with comments: CATARACT EXTRACTION PHACO AND INTRAOCULAR LENS PLACEMENT (IOC) RIGHT (Right) - 0.77 0:12.9  DIB00 +21.0   ULTRASOUND TIME: 0 minutes 12 seconds.  CDE 0.77   SURGEON:  Benay Pillow, MD, MPH  ANESTHESIOLOGIST: Anesthesiologist: Ronelle Nigh, MD CRNA: Silvana Newness, CRNA   ANESTHESIA:  Topical with tetracaine drops augmented with 1% preservative-free intracameral lidocaine.  ESTIMATED BLOOD LOSS: less than 1 mL.   COMPLICATIONS:  None.   DESCRIPTION OF PROCEDURE:  The patient was identified in the holding room and transported to the operating room and placed in the supine position under the operating microscope.  The right eye was identified as the operative eye and it was prepped and draped in the usual sterile ophthalmic fashion.   A 1.0 millimeter clear-corneal paracentesis was made at the 10:30 position. 0.5 ml of preservative-free 1% lidocaine with epinephrine was injected into the anterior chamber.  The anterior chamber was filled with Healon 5 viscoelastic.  A 2.4 millimeter keratome was used to make a near-clear corneal incision at the 8:00 position.  A curvilinear capsulorrhexis was made with a cystotome and capsulorrhexis forceps.  Balanced salt solution was used to hydrodissect and hydrodelineate the nucleus.   Phacoemulsification was then used in stop and chop fashion to remove the lens nucleus and epinucleus.  The remaining  cortex was then removed using the irrigation and aspiration handpiece. Healon was then placed into the capsular bag to distend it for lens placement.  A lens was then injected into the capsular bag.  The remaining viscoelastic was aspirated.   Wounds were hydrated with balanced salt solution.  The anterior chamber was inflated to a physiologic pressure with balanced salt solution.   Intracameral vigamox 0.1 mL undiluted was injected into the eye and a drop placed onto the ocular surface.  No wound leaks were noted.  The patient was taken to the recovery room in stable condition without complications of anesthesia or surgery  Benay Pillow 11/16/2020, 10:28 AM

## 2020-11-16 NOTE — Anesthesia Postprocedure Evaluation (Signed)
Anesthesia Post Note  Patient: Sabrina Stanley  Procedure(s) Performed: CATARACT EXTRACTION PHACO AND INTRAOCULAR LENS PLACEMENT (IOC) RIGHT (Right Eye)     Patient location during evaluation: PACU Anesthesia Type: MAC Level of consciousness: awake and alert and oriented Pain management: satisfactory to patient Vital Signs Assessment: post-procedure vital signs reviewed and stable Respiratory status: spontaneous breathing, nonlabored ventilation and respiratory function stable Cardiovascular status: blood pressure returned to baseline and stable Postop Assessment: Adequate PO intake and No signs of nausea or vomiting Anesthetic complications: no   No complications documented.  Raliegh Ip

## 2020-11-16 NOTE — Anesthesia Preprocedure Evaluation (Signed)
Anesthesia Evaluation  Patient identified by MRN, date of birth, ID band Patient awake    Reviewed: Allergy & Precautions, H&P , NPO status , Patient's Chart, lab work & pertinent test results  Airway Mallampati: II  TM Distance: >3 FB Neck ROM: full    Dental no notable dental hx.    Pulmonary sleep apnea , COPD, Current Smoker and Patient abstained from smoking.,    Pulmonary exam normal breath sounds clear to auscultation       Cardiovascular hypertension, + Peripheral Vascular Disease  Normal cardiovascular exam Rhythm:regular Rate:Normal     Neuro/Psych PSYCHIATRIC DISORDERS Bipolar Disorder Chronic pain  Neuromuscular disease    GI/Hepatic   Endo/Other  Hypothyroidism   Renal/GU      Musculoskeletal   Abdominal   Peds  Hematology   Anesthesia Other Findings   Reproductive/Obstetrics                             Anesthesia Physical Anesthesia Plan  ASA: III  Anesthesia Plan: MAC   Post-op Pain Management:    Induction:   PONV Risk Score and Plan: 2 and Treatment may vary due to age or medical condition, Midazolam and TIVA  Airway Management Planned:   Additional Equipment:   Intra-op Plan:   Post-operative Plan:   Informed Consent: I have reviewed the patients History and Physical, chart, labs and discussed the procedure including the risks, benefits and alternatives for the proposed anesthesia with the patient or authorized representative who has indicated his/her understanding and acceptance.     Dental Advisory Given  Plan Discussed with: CRNA  Anesthesia Plan Comments:         Anesthesia Quick Evaluation

## 2020-11-16 NOTE — Anesthesia Procedure Notes (Signed)
Procedure Name: MAC Date/Time: 11/16/2020 10:11 AM Performed by: Silvana Newness, CRNA Pre-anesthesia Checklist: Patient identified, Emergency Drugs available, Suction available, Patient being monitored and Timeout performed Patient Re-evaluated:Patient Re-evaluated prior to induction Oxygen Delivery Method: Nasal cannula Placement Confirmation: positive ETCO2

## 2020-11-16 NOTE — Transfer of Care (Signed)
Immediate Anesthesia Transfer of Care Note  Patient: Sabrina Stanley  Procedure(s) Performed: CATARACT EXTRACTION PHACO AND INTRAOCULAR LENS PLACEMENT (IOC) RIGHT (Right Eye)  Patient Location: PACU  Anesthesia Type: MAC  Level of Consciousness: awake, alert  and patient cooperative  Airway and Oxygen Therapy: Patient Spontanous Breathing and Patient connected to supplemental oxygen  Post-op Assessment: Post-op Vital signs reviewed, Patient's Cardiovascular Status Stable, Respiratory Function Stable, Patent Airway and No signs of Nausea or vomiting  Post-op Vital Signs: Reviewed and stable  Complications: No complications documented.

## 2020-11-17 ENCOUNTER — Encounter: Payer: Self-pay | Admitting: Ophthalmology

## 2020-11-26 ENCOUNTER — Other Ambulatory Visit: Payer: Medicare Other

## 2020-12-07 ENCOUNTER — Other Ambulatory Visit: Payer: Self-pay

## 2020-12-07 ENCOUNTER — Encounter: Payer: Self-pay | Admitting: Ophthalmology

## 2020-12-10 NOTE — Discharge Instructions (Signed)

## 2020-12-14 ENCOUNTER — Ambulatory Visit: Payer: Medicare Other | Admitting: Anesthesiology

## 2020-12-14 ENCOUNTER — Other Ambulatory Visit: Payer: Self-pay

## 2020-12-14 ENCOUNTER — Encounter
Admission: RE | Disposition: A | Discharge status: Home / Self Care | Payer: Self-pay | Source: Home / Self Care | Attending: Ophthalmology

## 2020-12-14 ENCOUNTER — Ambulatory Visit
Admission: RE | Admit: 2020-12-14 | Discharge: 2020-12-14 | Disposition: A | Discharge status: Home / Self Care | Payer: Medicare Other | Attending: Ophthalmology | Admitting: Ophthalmology

## 2020-12-14 ENCOUNTER — Encounter: Payer: Self-pay | Admitting: Ophthalmology

## 2020-12-14 DIAGNOSIS — Z809 Family history of malignant neoplasm, unspecified: Secondary | ICD-10-CM | POA: Diagnosis not present

## 2020-12-14 DIAGNOSIS — Z881 Allergy status to other antibiotic agents status: Secondary | ICD-10-CM | POA: Diagnosis not present

## 2020-12-14 DIAGNOSIS — Z8049 Family history of malignant neoplasm of other genital organs: Secondary | ICD-10-CM | POA: Diagnosis not present

## 2020-12-14 DIAGNOSIS — I10 Essential (primary) hypertension: Secondary | ICD-10-CM | POA: Diagnosis not present

## 2020-12-14 DIAGNOSIS — Z7982 Long term (current) use of aspirin: Secondary | ICD-10-CM | POA: Diagnosis not present

## 2020-12-14 DIAGNOSIS — Z801 Family history of malignant neoplasm of trachea, bronchus and lung: Secondary | ICD-10-CM | POA: Diagnosis not present

## 2020-12-14 DIAGNOSIS — F17210 Nicotine dependence, cigarettes, uncomplicated: Secondary | ICD-10-CM | POA: Diagnosis not present

## 2020-12-14 DIAGNOSIS — J449 Chronic obstructive pulmonary disease, unspecified: Secondary | ICD-10-CM | POA: Insufficient documentation

## 2020-12-14 DIAGNOSIS — Z888 Allergy status to other drugs, medicaments and biological substances status: Secondary | ICD-10-CM | POA: Diagnosis not present

## 2020-12-14 DIAGNOSIS — K219 Gastro-esophageal reflux disease without esophagitis: Secondary | ICD-10-CM | POA: Diagnosis not present

## 2020-12-14 DIAGNOSIS — U099 Post covid-19 condition, unspecified: Secondary | ICD-10-CM | POA: Diagnosis not present

## 2020-12-14 DIAGNOSIS — Z8249 Family history of ischemic heart disease and other diseases of the circulatory system: Secondary | ICD-10-CM | POA: Insufficient documentation

## 2020-12-14 DIAGNOSIS — Z880 Allergy status to penicillin: Secondary | ICD-10-CM | POA: Insufficient documentation

## 2020-12-14 DIAGNOSIS — Z791 Long term (current) use of non-steroidal anti-inflammatories (NSAID): Secondary | ICD-10-CM | POA: Diagnosis not present

## 2020-12-14 DIAGNOSIS — R059 Cough, unspecified: Secondary | ICD-10-CM | POA: Diagnosis not present

## 2020-12-14 DIAGNOSIS — H2512 Age-related nuclear cataract, left eye: Secondary | ICD-10-CM | POA: Insufficient documentation

## 2020-12-14 DIAGNOSIS — Z803 Family history of malignant neoplasm of breast: Secondary | ICD-10-CM | POA: Insufficient documentation

## 2020-12-14 HISTORY — PX: CATARACT EXTRACTION W/PHACO: SHX586

## 2020-12-14 SURGERY — PHACOEMULSIFICATION, CATARACT, WITH IOL INSERTION
Anesthesia: Monitor Anesthesia Care | Site: Eye | Laterality: Left

## 2020-12-14 MED ORDER — EPINEPHRINE PF 1 MG/ML IJ SOLN
INTRAOCULAR | Status: DC | PRN
  Administered 2020-12-14: 56 mL via OPHTHALMIC

## 2020-12-14 MED ORDER — ARMC OPHTHALMIC DILATING DROPS
1.0000 "application " | OPHTHALMIC | Status: DC | PRN
  Administered 2020-12-14 (×3): 1 via OPHTHALMIC

## 2020-12-14 MED ORDER — MOXIFLOXACIN HCL 0.5 % OP SOLN
OPHTHALMIC | Status: DC | PRN
  Administered 2020-12-14: 0.2 mL via OPHTHALMIC

## 2020-12-14 MED ORDER — SODIUM HYALURONATE 10 MG/ML IO SOLN
INTRAOCULAR | Status: DC | PRN
  Administered 2020-12-14: 0.55 mL via INTRAOCULAR

## 2020-12-14 MED ORDER — TETRACAINE HCL 0.5 % OP SOLN
1.0000 [drp] | OPHTHALMIC | Status: DC | PRN
  Administered 2020-12-14 (×3): 1 [drp] via OPHTHALMIC

## 2020-12-14 MED ORDER — SODIUM HYALURONATE 23 MG/ML IO SOLN
INTRAOCULAR | Status: DC | PRN
  Administered 2020-12-14: 0.6 mL via INTRAOCULAR

## 2020-12-14 MED ORDER — FENTANYL CITRATE (PF) 100 MCG/2ML IJ SOLN
INTRAMUSCULAR | Status: DC | PRN
  Administered 2020-12-14 (×2): 50 ug via INTRAVENOUS

## 2020-12-14 MED ORDER — LACTATED RINGERS IV SOLN: INTRAVENOUS | Status: DC

## 2020-12-14 MED ORDER — LIDOCAINE HCL (PF) 2 % IJ SOLN
INTRAOCULAR | Status: DC | PRN
  Administered 2020-12-14: 1 mL via INTRAOCULAR

## 2020-12-14 MED ORDER — MIDAZOLAM HCL 2 MG/2ML IJ SOLN
INTRAMUSCULAR | Status: DC | PRN
  Administered 2020-12-14 (×2): 1 mg via INTRAVENOUS

## 2020-12-14 SURGICAL SUPPLY — 19 items
CANNULA ANT/CHMB 27G (MISCELLANEOUS) ×2 IMPLANT
CANNULA ANT/CHMB 27GA (MISCELLANEOUS) ×4 IMPLANT
DISSECTOR HYDRO NUCLEUS 50X22 (MISCELLANEOUS) ×2 IMPLANT
GLOVE PI ULTRA LF STRL 7.5 (GLOVE) ×1 IMPLANT
GLOVE PI ULTRA NON LATEX 7.5 (GLOVE) ×2
GLOVE SURG SYN 8.5  E (GLOVE) ×2
GLOVE SURG SYN 8.5 E (GLOVE) ×1 IMPLANT
GLOVE SURG SYN 8.5 PF PI (GLOVE) ×1 IMPLANT
GOWN STRL REUS W/ TWL LRG LVL3 (GOWN DISPOSABLE) ×2 IMPLANT
GOWN STRL REUS W/TWL LRG LVL3 (GOWN DISPOSABLE) ×4
LENS IOL TECNIS EYHANCE 22.0 (Intraocular Lens) ×1 IMPLANT
MARKER SKIN DUAL TIP RULER LAB (MISCELLANEOUS) ×2 IMPLANT
PACK DR. KING ARMS (PACKS) ×2 IMPLANT
PACK EYE AFTER SURG (MISCELLANEOUS) ×2 IMPLANT
PACK OPTHALMIC (MISCELLANEOUS) ×2 IMPLANT
SYR 3ML LL SCALE MARK (SYRINGE) ×2 IMPLANT
SYR TB 1ML LUER SLIP (SYRINGE) ×2 IMPLANT
WATER STERILE IRR 250ML POUR (IV SOLUTION) ×2 IMPLANT
WIPE NON LINTING 3.25X3.25 (MISCELLANEOUS) ×2 IMPLANT

## 2020-12-14 NOTE — Transfer of Care (Signed)
Immediate Anesthesia Transfer of Care Note  Patient: Sabrina Stanley  Procedure(s) Performed: CATARACT EXTRACTION PHACO AND INTRAOCULAR LENS PLACEMENT (IOC) LEFT (Left Eye)  Patient Location: PACU  Anesthesia Type: MAC  Level of Consciousness: awake, alert  and patient cooperative  Airway and Oxygen Therapy: Patient Spontanous Breathing and Patient connected to supplemental oxygen  Post-op Assessment: Post-op Vital signs reviewed, Patient's Cardiovascular Status Stable, Respiratory Function Stable, Patent Airway and No signs of Nausea or vomiting  Post-op Vital Signs: Reviewed and stable  Complications: No complications documented.

## 2020-12-14 NOTE — Anesthesia Procedure Notes (Signed)
Procedure Name: MAC Date/Time: 12/14/2020 8:58 AM Performed by: Silvana Newness, CRNA Pre-anesthesia Checklist: Patient identified, Emergency Drugs available, Suction available, Patient being monitored and Timeout performed Patient Re-evaluated:Patient Re-evaluated prior to induction Oxygen Delivery Method: Nasal cannula Placement Confirmation: positive ETCO2

## 2020-12-14 NOTE — Anesthesia Preprocedure Evaluation (Signed)
Anesthesia Evaluation  Patient identified by MRN, date of birth, ID band Patient awake    Reviewed: Allergy & Precautions, H&P , NPO status , Patient's Chart, lab work & pertinent test results  History of Anesthesia Complications Negative for: history of anesthetic complications  Airway Mallampati: II  TM Distance: >3 FB Neck ROM: full    Dental  (+) Upper Dentures   Pulmonary sleep apnea (no cpap) , COPD (mild), Current Smoker and Patient abstained from smoking.,    Pulmonary exam normal breath sounds clear to auscultation       Cardiovascular hypertension, (-) angina (denies CP)+ Peripheral Vascular Disease (leg edema and stasis)  Normal cardiovascular exam Rhythm:regular Rate:Normal     Neuro/Psych  Headaches, PSYCHIATRIC DISORDERS Anxiety Depression Bipolar Disorder Hx EtOh abuse, last 5 years agoChronic pain  > morphine & oxycodone Peripheral neuropathy bilateral feet   RLegS  DDD  Hand tremors    GI/Hepatic Neg liver ROS, GERD  Controlled,  Endo/Other  Morbid obesity (bmi 32)  Renal/GU negative Renal ROS     Musculoskeletal  (+) Arthritis , Fibromyalgia -  Abdominal   Peds  Hematology negative hematology ROS (+)   Anesthesia Other Findings had [facility] on 11/16/20;  neurology :  Benay Spice, [place] at 11/21/2020 ; pcpIdelle Crouch, MD at 10/08/2020 ;  COVID-19 05/2020   Reproductive/Obstetrics                             Anesthesia Physical  Anesthesia Plan  ASA: III  Anesthesia Plan: MAC   Post-op Pain Management:    Induction:   PONV Risk Score and Plan: 2 and Midazolam and TIVA  Airway Management Planned:   Additional Equipment:   Intra-op Plan:   Post-operative Plan:   Informed Consent: I have reviewed the patients History and Physical, chart, labs and discussed the procedure including the risks, benefits and alternatives for the proposed anesthesia  with the patient or authorized representative who has indicated his/her understanding and acceptance.     Dental Advisory Given  Plan Discussed with: CRNA  Anesthesia Plan Comments:         Anesthesia Quick Evaluation

## 2020-12-14 NOTE — Anesthesia Postprocedure Evaluation (Signed)
Anesthesia Post Note  Patient: Sabrina Stanley  Procedure(s) Performed: CATARACT EXTRACTION PHACO AND INTRAOCULAR LENS PLACEMENT (IOC) LEFT (Left Eye)     Patient location during evaluation: PACU Anesthesia Type: MAC Level of consciousness: awake and alert Pain management: pain level controlled Vital Signs Assessment: post-procedure vital signs reviewed and stable Respiratory status: spontaneous breathing, nonlabored ventilation, respiratory function stable and patient connected to nasal cannula oxygen Cardiovascular status: stable and blood pressure returned to baseline Postop Assessment: no apparent nausea or vomiting Anesthetic complications: no   No complications documented.  Fidel Levy

## 2020-12-14 NOTE — Op Note (Signed)
OPERATIVE NOTE  Sabrina Stanley [id number] 12/14/2020   PREOPERATIVE DIAGNOSIS:  Nuclear sclerotic cataract left eye.  H25.12   POSTOPERATIVE DIAGNOSIS:    Nuclear sclerotic cataract left eye.     PROCEDURE:  Phacoemusification with posterior chamber intraocular lens placement of the left eye   LENS:   Implant Name Type Inv. Item Serial No. Manufacturer Lot No. LRB No. Used Action  LENS IOL TECNIS EYHANCE 22.0 - [id number] Intraocular Lens LENS IOL TECNIS EYHANCE 22.0 [id number] JOHNSON   Left 1 Implanted      Procedure(s) with comments: CATARACT EXTRACTION PHACO AND INTRAOCULAR LENS PLACEMENT (IOC) LEFT (Left) - 1.25 0:12.0  DIB00 +22.0   ULTRASOUND TIME: 0 minutes 12 seconds.  CDE 1.25   SURGEON:  Benay Pillow, MD, MPH   ANESTHESIA:  Topical with tetracaine drops augmented with 1% preservative-free intracameral lidocaine.  ESTIMATED BLOOD LOSS: <1 mL   COMPLICATIONS:  None.   DESCRIPTION OF PROCEDURE:  The patient was identified in the holding room and transported to the operating room and placed in the supine position under the operating microscope.  The left eye was identified as the operative eye and it was prepped and draped in the usual sterile ophthalmic fashion.   A 1.0 millimeter clear-corneal paracentesis was made at the 5:00 position. 0.5 ml of preservative-free 1% lidocaine with epinephrine was injected into the anterior chamber.  The anterior chamber was filled with Healon 5 viscoelastic.  A 2.4 millimeter keratome was used to make a near-clear corneal incision at the 2:00 position.  A curvilinear capsulorrhexis was made with a cystotome and capsulorrhexis forceps.  Balanced salt solution was used to hydrodissect and hydrodelineate the nucleus.   Phacoemulsification was then used in stop and chop fashion to remove the lens nucleus and epinucleus.  The remaining cortex was then removed using the irrigation and aspiration handpiece. Healon was then placed into the  capsular bag to distend it for lens placement.  A lens was then injected into the capsular bag.  The remaining viscoelastic was aspirated.   Wounds were hydrated with balanced salt solution.  The anterior chamber was inflated to a physiologic pressure with balanced salt solution.  Intracameral vigamox 0.1 mL undiltued was injected into the eye and a drop placed onto the ocular surface.  No wound leaks were noted.  The patient was taken to the recovery room in stable condition without complications of anesthesia or surgery  Benay Pillow 12/14/2020, 9:17 AM

## 2020-12-14 NOTE — H&P (Signed)
[facility]   Primary Care Physician:  Idelle Crouch, MD Ophthalmologist: Dr. Benay Pillow  Pre-Procedure History & Physical: HPI:  Sabrina Stanley is a 60 y.o. female here for cataract surgery.   Past Medical History:  Diagnosis Date  . Abnormal mammogram, unspecified   . Alcoholism ([place])   . Allergic state   . Anemia   . Anxiety   . Arthritis   . Bilateral breast cysts   . Bipolar 1 disorder ([place])   . Bipolar 1 disorder ([place])   . Bone marrow disorder   . Bronchitis   . Cervical stenosis of spine   . Cervicalgia   . Chronic diarrhea   . Chronic fatigue disorder   . Chronic pain   . COPD (chronic obstructive pulmonary disease) ([place])   . COVID-19 05/2020  . Depression   . Depression   . Eczema   . Esophageal reflux   . Fibromyalgia   . GERD (gastroesophageal reflux disease)   . Headache   . Hiatal hernia   . History of esophagogastroduodenoscopy (EGD)   . Hyperlipidemia   . Hyperlipidemia   . Hypertension   . Hypothyroidism   . Insomnia   . Lithium toxicity   . Menstrual irregularity   . Myalgia   . Neuropathy    lower legs  . Osteopenia   . Osteopenia   . Panic disorder   . Pleurisy   . Pneumonia   . Psoriasis   . PVD (peripheral vascular disease) ([place])   . Sleep apnea    No CPAP  . Tobacco abuse   . UTI (urinary tract infection)   . Vitamin D deficiency   . Wears dentures    partial lower    Past Surgical History:  Procedure Laterality Date  . ABDOMINAL HYSTERECTOMY  1996  . BREAST BIOPSY Right 08-21-12   FIBROADENOMATOUS CHANGE WITH CALCIFICATIONS.   Marland Kitchen BREAST BIOPSY Right    FIBROUS SCARRING, negative for atypia  . CATARACT EXTRACTION W/PHACO Right 11/16/2020   Procedure: CATARACT EXTRACTION PHACO AND INTRAOCULAR LENS PLACEMENT ([place]) RIGHT;  Surgeon: Eulogio Bear, MD;  Location: [facility];  Service: Ophthalmology;  Laterality: Right;  0.77 0:12.9  . COLONOSCOPY    . [place]  .  ESOPHAGOGASTRODUODENOSCOPY    . ESOPHAGOGASTRODUODENOSCOPY N/A 09/17/2015   Procedure: ESOPHAGOGASTRODUODENOSCOPY (EGD);  Surgeon: Hulen Luster, MD;  Location: [facility] ENDOSCOPY;  Service: Gastroenterology;  Laterality: N/A;  . EYE SURGERY  2010  . KNEE SURGERY Right 1998   meniscus repair  . NASAL SINUS SURGERY  1998, 2000  . NECK SURGERY     x 2  . OOPHORECTOMY    . SPINE SURGERY  2006, 2008   cervical fusion  . TONSILLECTOMY      Prior to Admission medications   Medication Sig Start Date End Date Taking? Authorizing Provider  ABILIFY 5 MG tablet 5 mg daily. 01/30/13  Yes [provider]  atorvastatin (LIPITOR) 10 MG tablet  08/02/17  Yes [provider]  Biotin 5 MG CAPS Take 1,000 mg by mouth daily.   Yes [provider]  bisoprolol-hydrochlorothiazide (ZIAC) 2.5-6.25 MG tablet Take 1 tablet by mouth daily.   Yes [provider]  buPROPion (WELLBUTRIN) 100 MG tablet Take 100 mg by mouth daily.   Yes [provider]  clobetasol ointment (TEMOVATE) 2.72 % Apply 1 application topically 2 (two) times daily. Use up to 3 weeks then prn 11/29/19  Yes Moye, [place], MD  cyanocobalamin (,VITAMIN B-12,) 1000 MCG/ML injection Inject into the muscle. 12/14/15  Yes [provider]  diclofenac sodium (VOLTAREN) 1 % GEL Apply topically. 05/17/19  Yes [provider]  divalproex (DEPAKOTE ER) 500 MG 24 hr tablet  06/26/17  Yes [provider]  FLUoxetine (PROZAC) 20 MG capsule Take 60 mg by mouth daily.    Yes [provider]  Fluticasone-Salmeterol (ADVAIR) 100-50 MCG/DOSE AEPB Inhale 1 puff into the lungs daily as needed.   Yes [provider]  furosemide (LASIX) 20 MG tablet Take by mouth. 05/24/19 12/14/20 Yes [provider]  Meth-Hyo-M Bl-Na Phos-Ph Sal (URIBEL) 118 MG CAPS Take by mouth.   Yes [provider]  morphine (MS CONTIN) 15 MG 12 hr tablet Limit 2-3 tabs by mouth per day if tolerated  04/20/16  Yes Mohammed Kindle, MD  ondansetron (ZOFRAN-ODT) 4 MG disintegrating tablet  05/31/19  Yes [provider]  oxyCODONE-acetaminophen (PERCOCET) 10-325 MG tablet Take 1 tablet by mouth every 4 (four) hours as needed. 10/07/19  Yes [provider]  potassium chloride SA (K-DUR,KLOR-CON) 20 MEQ tablet Take 20 mEq by mouth daily.   Yes [provider]  primidone (MYSOLINE) 50 MG tablet Take 50 mg by mouth 2 (two) times daily.   Yes [provider]  rOPINIRole (REQUIP) 1 MG tablet Take 1 mg by mouth at bedtime.    Yes [provider]  SUMAtriptan (IMITREX) 100 MG tablet Take 100 mg by mouth every 2 (two) hours as needed for migraine.   Yes [provider]  tiZANidine (ZANAFLEX) 4 MG tablet TAKE 1 TABLET BY MOUTH NIGHTLY 12/11/18  Yes [provider]  topiramate (TOPAMAX) 200 MG tablet Take 200 mg by mouth daily. 10/16/19  Yes [provider]  Vitamin D, Ergocalciferol, (DRISDOL) 50000 units CAPS capsule  08/28/17  Yes [provider]  benzonatate (TESSALON) 200 MG capsule Take 200 mg by mouth 3 (three) times daily as needed for cough. Patient not taking: Reported on 12/14/2020    [provider]  naloxone [facility]) 4 MG/0.1ML LIQD nasal spray kit Place into the nose.    [provider]  oxyCODONE-acetaminophen (PERCOCET) 7.5-325 MG tablet Take 1 tablet by mouth every 4 (four) hours as needed for severe pain. Patient not taking: Reported on 12/14/2020    [provider]    Allergies as of 09/29/2020 - Review Complete 12/10/2019  Allergen Reaction Noted  . Asa [aspirin]  12/27/2012  . Codeine    . Cyclobenzaprine Other (See Comments) 01/06/2014  . Darvocet [propoxyphene n-acetaminophen]  12/27/2012  . Effexor [venlafaxine hcl] Other (See Comments) 12/27/2012  . Erythromycin Other (See Comments) 01/06/2014  . Meperidine-promethazine    . Neurontin [gabapentin]  12/27/2012  . Nsaids   09/02/2013  . Propoxyphene    . Mobic [meloxicam] Palpitations 12/27/2012  . Penicillins Rash 12/27/2012    Family History  Problem Relation Age of Onset  . Lung cancer Father   . Cancer Father   . Depression Mother   . Hypertension Mother   . Breast cancer Maternal Aunt        great aunt  . Lung cancer Paternal Uncle   . Cervical cancer Maternal Grandmother     Social History   Socioeconomic History  . Marital status: Widowed    Spouse name: Not on file  . Number of children: Not on file  . Years of education: Not on file  . Highest education level:  Not on file  Occupational History  . Not on file  Tobacco Use  . Smoking status: Current Every Day Smoker    Packs/day: 1.00    Years: 35.00    Pack years: 35.00    Types: Cigarettes  . Smokeless tobacco: Never Used  . Tobacco comment: started age 88  Vaping Use  . Vaping Use: Former  Substance and Sexual Activity  . Alcohol use: No    Alcohol/week: 0.0 standard drinks  . Drug use: No  . Sexual activity: Not on file  Other Topics Concern  . Not on file  Social History Narrative  . Not on file   Social Determinants of Health   Financial Resource Strain: Not on file  Food Insecurity: Not on file  Transportation Needs: Not on file  Physical Activity: Not on file  Stress: Not on file  Social Connections: Not on file  Intimate Partner Violence: Not on file    Review of Systems: See HPI, otherwise negative ROS  Physical Exam: BP 118/73   Pulse 84   Temp (!) 96.4 F (35.8 C) (Temporal)   Resp 16   Ht $R'5\' 2"'Nk$  (1.575 m)   Wt 79.9 kg   SpO2 95%   BMI 32.23 kg/m  General:   Alert,  pleasant and cooperative in NAD Head:  Normocephalic and atraumatic. Respiratory:  Normal work of breathing. Cardiovascular:  RRR  Impression/Plan: Sabrina Stanley is here for cataract surgery.  Risks, benefits, limitations, and alternatives regarding cataract surgery have been reviewed with the patient.  Questions have been  answered.  All parties agreeable.   Benay Pillow, MD  12/14/2020, 8:49 AM

## 2020-12-15 ENCOUNTER — Encounter: Payer: Self-pay | Admitting: Ophthalmology

## 2021-04-23 ENCOUNTER — Other Ambulatory Visit: Payer: Self-pay | Admitting: Internal Medicine

## 2021-04-23 DIAGNOSIS — M5440 Lumbago with sciatica, unspecified side: Secondary | ICD-10-CM

## 2021-05-05 ENCOUNTER — Ambulatory Visit
Admission: RE | Admit: 2021-05-05 | Discharge: 2021-05-05 | Disposition: A | Discharge status: Home / Self Care | Payer: Medicare Other | Source: Ambulatory Visit | Attending: Internal Medicine | Admitting: Internal Medicine

## 2021-05-05 DIAGNOSIS — M5440 Lumbago with sciatica, unspecified side: Secondary | ICD-10-CM | POA: Insufficient documentation

## 2021-05-14 ENCOUNTER — Other Ambulatory Visit: Payer: Self-pay

## 2021-05-14 ENCOUNTER — Encounter (INDEPENDENT_AMBULATORY_CARE_PROVIDER_SITE_OTHER): Payer: Self-pay | Admitting: Vascular Surgery

## 2021-05-14 ENCOUNTER — Ambulatory Visit (INDEPENDENT_AMBULATORY_CARE_PROVIDER_SITE_OTHER): Payer: Medicare Other | Admitting: Vascular Surgery

## 2021-05-14 VITALS — BP 121/76 | HR 80 | Resp 16 | Wt 177.0 lb

## 2021-05-14 DIAGNOSIS — M79605 Pain in left leg: Secondary | ICD-10-CM

## 2021-05-14 DIAGNOSIS — I10 Essential (primary) hypertension: Secondary | ICD-10-CM | POA: Diagnosis not present

## 2021-05-14 DIAGNOSIS — M79609 Pain in unspecified limb: Secondary | ICD-10-CM | POA: Insufficient documentation

## 2021-05-14 DIAGNOSIS — I890 Lymphedema, not elsewhere classified: Secondary | ICD-10-CM | POA: Diagnosis not present

## 2021-05-14 DIAGNOSIS — M79604 Pain in right leg: Secondary | ICD-10-CM | POA: Diagnosis not present

## 2021-05-14 NOTE — Assessment & Plan Note (Signed)
Her lymphedema is quite significant.  She is using her lymphedema pump, but not consistently wearing her compression stockings and we told her that is going to be very important to keep her symptoms under reasonable control.  When she has flares and has weeping from her wounds, she needs to contact our office for Unna boots.  She should elevate her legs and ambulate as much as possible.  She is already in the pain clinic for the pain in her legs. RTC one year.

## 2021-05-14 NOTE — Progress Notes (Signed)
MRN : [id number]  Sabrina Stanley is a 60 y.o. (1961/05/05) female who presents with chief complaint of  Chief Complaint  Patient presents with   Follow-up    Ref [place] lymphedema  .  History of Present Illness: Patient is referred back from her primary care physician for follow-up of severe and persistent lymphedema.  She was last seen almost 2 years ago.  At that time, she was not wearing her compression socks, elevating her legs, or using lymphedema pump.  She is using the lymphedema pump and does try to elevate her legs some.  It sounds like she is using her compression socks occasionally to intermittently.  She has had previous arterial and venous work-up which were very unrevealing and it sounds like she has lymphedema and chronic pain syndrome.  She is already seeing the pain clinic.  Her lymphedema is significant.  She has had previous cellulitis although none recently.  She does get weeping and blistering of the skin a little more on the left than the right.  None is currently present.  Current Outpatient Medications  Medication Sig Dispense Refill   ABILIFY 5 MG tablet 5 mg daily.     Asenapine Maleate 10 MG SUBL      atorvastatin (LIPITOR) 10 MG tablet      Biotin 5 MG CAPS Take 1,000 mg by mouth daily.     buPROPion (WELLBUTRIN) 100 MG tablet Take 100 mg by mouth daily.     clobetasol ointment (TEMOVATE) 0.05 % Apply 1 application topically 2 (two) times daily. Use up to 3 weeks then prn 60 g 1   cyanocobalamin (,VITAMIN B-12,) 1000 MCG/ML injection Inject into the muscle.     diclofenac sodium (VOLTAREN) 1 % GEL Apply topically.     divalproex (DEPAKOTE ER) 500 MG 24 hr tablet      FLUoxetine (PROZAC) 20 MG capsule Take 60 mg by mouth daily.      Fluticasone-Salmeterol (ADVAIR) 100-50 MCG/DOSE AEPB Inhale 1 puff into the lungs daily as needed.     furosemide (LASIX) 20 MG tablet Take by mouth.     Meth-Hyo-M Bl-Na Phos-Ph Sal (URIBEL) 118 MG CAPS Take by mouth.     morphine  (MS CONTIN) 15 MG 12 hr tablet Limit 2-3 tabs by mouth per day if tolerated 90 tablet 0   naloxone (NARCAN) 4 MG/0.1ML LIQD nasal spray kit Place into the nose.     oxyCODONE-acetaminophen (PERCOCET) 10-325 MG tablet Take 1 tablet by mouth every 4 (four) hours as needed.     potassium chloride SA (K-DUR,KLOR-CON) 20 MEQ tablet Take 20 mEq by mouth daily.     primidone (MYSOLINE) 50 MG tablet Take 50 mg by mouth 2 (two) times daily.     rOPINIRole (REQUIP) 1 MG tablet Take 1 mg by mouth at bedtime.      SUMAtriptan (IMITREX) 100 MG tablet Take 100 mg by mouth every 2 (two) hours as needed for migraine.     tiZANidine (ZANAFLEX) 4 MG tablet TAKE 1 TABLET BY MOUTH NIGHTLY     topiramate (TOPAMAX) 200 MG tablet Take 200 mg by mouth daily.     Vitamin D, Ergocalciferol, (DRISDOL) 50000 units CAPS capsule      benzonatate (TESSALON) 200 MG capsule Take 200 mg by mouth 3 (three) times daily as needed for cough. (Patient not taking: No sig reported)     bisoprolol-hydrochlorothiazide (ZIAC) 2.5-6.25 MG tablet Take 1 tablet by mouth daily. (Patient not taking: Reported  on 05/14/2021)     ondansetron (ZOFRAN-ODT) 4 MG disintegrating tablet  (Patient not taking: Reported on 05/14/2021)     oxyCODONE-acetaminophen (PERCOCET) 7.5-325 MG tablet Take 1 tablet by mouth every 4 (four) hours as needed for severe pain. (Patient not taking: No sig reported)     Current Facility-Administered Medications  Medication Dose Route Frequency Provider Last Rate Last Admin   lidocaine (PF) (XYLOCAINE) 1 % injection 10 mL  10 mL Subcutaneous Once Mohammed Kindle, MD       midazolam (VERSED) 5 MG/5ML injection 5 mg  5 mg Intravenous Once Mohammed Kindle, MD       orphenadrine (NORFLEX) injection 60 mg  60 mg Intramuscular Once Mohammed Kindle, MD        Past Medical History:  Diagnosis Date   Abnormal mammogram, unspecified    Alcoholism ([place])    Allergic state    Anemia    Anxiety    Arthritis    Bilateral breast cysts     Bipolar 1 disorder ([place])    Bipolar 1 disorder ([place])    Bone marrow disorder    Bronchitis    Cervical stenosis of spine    Cervicalgia    Chronic diarrhea    Chronic fatigue disorder    Chronic pain    COPD (chronic obstructive pulmonary disease) ([place])    COVID-19 05/2020   Depression    Depression    Eczema    Esophageal reflux    Fibromyalgia    GERD (gastroesophageal reflux disease)    Headache    Hiatal hernia    History of esophagogastroduodenoscopy (EGD)    Hyperlipidemia    Hyperlipidemia    Hypertension    Hypothyroidism    Insomnia    Lithium toxicity    Menstrual irregularity    Myalgia    Neuropathy    lower legs   Osteopenia    Osteopenia    Panic disorder    Pleurisy    Pneumonia    Psoriasis    PVD (peripheral vascular disease) ([facility])    Sleep apnea    No CPAP   Tobacco abuse    UTI (urinary tract infection)    Vitamin D deficiency    Wears dentures    partial lower    Past Surgical History:  Procedure Laterality Date   ABDOMINAL HYSTERECTOMY  1996   BREAST BIOPSY Right 08-21-12   FIBROADENOMATOUS CHANGE WITH CALCIFICATIONS.    BREAST BIOPSY Right    FIBROUS SCARRING, negative for atypia   CATARACT EXTRACTION W/PHACO Right 11/16/2020   Procedure: CATARACT EXTRACTION PHACO AND INTRAOCULAR LENS PLACEMENT (I[facility]) RIGHT;  Surgeon: Eulogio Bear, MD;  Location: [place];  Service: Ophthalmology;  Laterality: Right;  0.77 0:12.9   CATARACT EXTRACTION W/PHACO Left 12/14/2020   Procedure: CATARACT EXTRACTION PHACO AND INTRAOCULAR LENS PLACEMENT ([facility]) LEFT;  Surgeon: Eulogio Bear, MD;  Location: [place];  Service: Ophthalmology;  Laterality: Left;  1.25 0:12.0   COLONOSCOPY     DILATION AND CURETTAGE OF UTERUS  1979, 1980   ESOPHAGOGASTRODUODENOSCOPY     ESOPHAGOGASTRODUODENOSCOPY N/A 09/17/2015   Procedure: ESOPHAGOGASTRODUODENOSCOPY (EGD);  Surgeon: Hulen Luster, MD;  Location: [facility] ENDOSCOPY;  Service:  Gastroenterology;  Laterality: N/A;   EYE SURGERY  2010   KNEE SURGERY Right 1998   meniscus repair   NASAL SINUS SURGERY  1998, 2000   NECK SURGERY     x 2   OOPHORECTOMY     SPINE SURGERY  2006, 2008   cervical fusion   TONSILLECTOMY       Social History   Tobacco Use   Smoking status: Every Day    Packs/day: 1.00    Years: 35.00    Pack years: 35.00    Types: Cigarettes   Smokeless tobacco: Never   Tobacco comments:    started age 13  Vaping Use   Vaping Use: Former  Substance Use Topics   Alcohol use: No    Alcohol/week: 0.0 standard drinks   Drug use: No      Family History  Problem Relation Age of Onset   Lung cancer Father    Cancer Father    Depression Mother    Hypertension Mother    Breast cancer Maternal Aunt        great aunt   Lung cancer Paternal Uncle    Cervical cancer Maternal Grandmother      Allergies  Allergen Reactions   Asa [Aspirin]     Stomach pain   Codeine     Other reaction(s): Diarrhea and vomiting (finding)   Cyclobenzaprine Other (See Comments)    Other reaction(s): Altered mental status (finding), Unknown   Darvocet [Propoxyphene N-Acetaminophen]     Stomach pain   Effexor [Venlafaxine Hcl] Other (See Comments)    hallucinations   Erythromycin Other (See Comments)    Other reaction(s): Distress (finding)   Meperidine-Promethazine     Other reaction(s): Other (qualifier value) Causes internal itching   Neurontin [Gabapentin]     fatigue   Nsaids     Bad stomach ache    Propoxyphene     Other reaction(s): Other (qualifier value) causes constipation   Mobic [Meloxicam] Palpitations   Penicillins Rash    REVIEW OF SYSTEMS (Negative unless checked) Constitutional: [] Weight loss  [] Fever  [] Chills Cardiac: [] Chest pain   []  Atrial Fibrillation  [] Palpitations   [] Shortness of breath when laying flat   [] Shortness of breath with exertion. [] Shortness of breath at rest Vascular:  [x] Pain in legs with walking    [x] Pain in legs with standing [] Pain in legs when laying flat   [] Claudication    [] Pain in feet when laying flat    [] History of DVT   [] Phlebitis   [x] Swelling in legs   [x] Varicose veins   [] Non-healing ulcers Pulmonary:   [] Uses home oxygen   [] Productive cough   [] Hemoptysis   [] Wheeze  [] COPD   [] Asthma Neurologic:  [] Dizziness   [] Seizures  [] Blackouts [] History of stroke   [] History of TIA  [] Aphasia   [] Temporary Blindness   [] Weakness or numbness in arm   [x] Weakness or numbness in leg Musculoskeletal:   [] Joint swelling   [] Joint pain   [] Low back pain  []  History of Knee Replacement [] Arthritis [] back Surgeries  []  Spinal Stenosis    Hematologic:  [] Easy bruising  [] Easy bleeding   [] Hypercoagulable state   [] Anemic Gastrointestinal:  [x] Diarrhea   [] Vomiting  [x] Gastroesophageal reflux/heartburn   [] Difficulty swallowing. [] Abdominal pain Genitourinary:  [] Chronic kidney disease   [] Difficult urination  [] Anuric   [] Blood in urine [] Frequent urination  [] Burning with urination   [] Hematuria Skin:  [] Rashes   [] Ulcers [] Wounds Psychological:  [x] History of anxiety   [x]  History of major depression  []  Memory   Physical Examination  BP 121/76 (BP Location: Right Arm)   Pulse 80   Resp 16   Wt 177 lb (80.3 kg)   BMI 32.37 kg/m  Gen:  WD/WN, NAD Head: [place]/AT, No temporalis  wasting. Ear/Nose/Throat: Hearing grossly intact, nares w/o erythema or drainage Eyes: Conjunctiva clear. Sclera non-icteric Neck: Supple.  Trachea midline Pulmonary:  Good air movement, no use of accessory muscles.  Cardiac: RRR, no JVD Vascular:  Vessel Right Left  Radial Palpable Palpable                          PT Trace Palpable Not Palpable  DP Palpable Palpable   Musculoskeletal: M/S 5/5 throughout.  No deformity or atrophy.  1+ right lower extremity edema, 2+ left lower extremity edema.  Marked stasis dermatitis changes present both lower extremities Neurologic: Sensation grossly intact in  extremities.  Symmetrical.  Speech is fluent.  Psychiatric: Judgment intact, Mood & affect appropriate for pt's clinical situation. Dermatologic: No rashes or ulcers noted.  No cellulitis or open wounds.      Labs No results found for this or any previous visit (from the past 2160 hour(s)).  Radiology MR LUMBAR SPINE WO CONTRAST  Result Date: 05/06/2021 CLINICAL DATA:  Low back pain with right gluteal pain for a few years EXAM: MRI LUMBAR SPINE WITHOUT CONTRAST TECHNIQUE: Multiplanar, multisequence MR imaging of the lumbar spine was performed. No intravenous contrast was administered. COMPARISON:  10/30/2018 FINDINGS: Segmentation:  5 lumbar type vertebrae Alignment:  Physiologic. Vertebrae:  No fracture, evidence of discitis, or bone lesion. Conus medullaris and cauda equina: Conus extends to the L1 level. Conus and cauda equina appear normal. Paraspinal and other soft tissues: No evidence of perispinal mass or inflammation Disc levels: T12- L1: Unremarkable. L1-L2: Mild ventral spondylitic spurring L2-L3: Mild disc bulging L3-L4: Mild disc bulging L4-L5: Disc bulging with small central protrusion. Degenerative facet spurring which is similar to prior but there is a new right joint effusion. Ligamentum flavum thickening. Narrowing of the subarticular recesses but no static L5 compression. Spinal stenosis is overall mild to moderate. L5-S1:Tiny central disc protrusion.  Mild facet spurring. IMPRESSION: 1. Progression of L4-5 facet osteoarthritis since 2020, with newly seen right joint effusion. Mild to moderate thecal sac narrowing at this level has also mildly progressed from prior. 2. Diffusely patent foramina Electronically Signed   By: Jorje Guild M.D.   On: 05/06/2021 12:41    Assessment/Plan Benign essential hypertension blood pressure control important in reducing the progression of atherosclerotic disease. On appropriate oral medications.  Lymphedema Her lymphedema is quite  significant.  She is using her lymphedema pump, but not consistently wearing her compression stockings and we told her that is going to be very important to keep her symptoms under reasonable control.  When she has flares and has weeping from her wounds, she needs to contact our office for Unna boots.  She should elevate her legs and ambulate as much as possible.  She is already in the pain clinic for the pain in her legs. RTC one year.  Pain in limb We again discussed means to try to get her lymph edema under better control.  She is already seeing the pain clinic.    Leotis Pain, MD  05/14/2021 11:28 AM    This note was created with Dragon medical transcription system.  Any errors from dictation are purely unintentional

## 2021-05-14 NOTE — Assessment & Plan Note (Signed)
We again discussed means to try to get her lymph edema under better control.  She is already seeing the pain clinic.

## 2021-07-12 ENCOUNTER — Other Ambulatory Visit: Payer: Self-pay

## 2021-07-12 ENCOUNTER — Ambulatory Visit (INDEPENDENT_AMBULATORY_CARE_PROVIDER_SITE_OTHER): Payer: Medicare Other | Admitting: Dermatology

## 2021-07-12 DIAGNOSIS — L817 Pigmented purpuric dermatosis: Secondary | ICD-10-CM | POA: Diagnosis not present

## 2021-07-12 DIAGNOSIS — L299 Pruritus, unspecified: Secondary | ICD-10-CM

## 2021-07-12 DIAGNOSIS — L853 Xerosis cutis: Secondary | ICD-10-CM | POA: Diagnosis not present

## 2021-07-12 DIAGNOSIS — L819 Disorder of pigmentation, unspecified: Secondary | ICD-10-CM

## 2021-07-12 DIAGNOSIS — I872 Venous insufficiency (chronic) (peripheral): Secondary | ICD-10-CM

## 2021-07-12 MED ORDER — TRIAMCINOLONE ACETONIDE 0.1 % EX CREA: 1.0000 "application " | TOPICAL_CREAM | CUTANEOUS | 2 refills | Status: AC

## 2021-07-12 NOTE — Patient Instructions (Signed)
Use cream to legs nightly Monday - Friday only  Use Compression stockings - use daily and take off at night  Topical steroids (such as triamcinolone, fluocinolone, fluocinonide, mometasone, clobetasol, halobetasol, betamethasone, hydrocortisone) can cause thinning and lightening of the skin if they are used for too long in the same area. Your physician has selected the right strength medicine for your problem and area affected on the body. Please use your medication only as directed by your physician to prevent side effects.   If you have any questions or concerns for your doctor, please call our main line at [phone] and press option 4 to reach your doctor's medical assistant. If no one answers, please leave a voicemail as directed and we will return your call as soon as possible. Messages left after 4 pm will be answered the following business day.   You may also send [place] a message via [place]. We typically respond to MyChart messages within 1-2 business days.  For prescription refills, please ask your pharmacy to contact our office. Our fax number is [phone].  If you have an urgent issue when the clinic is closed that cannot wait until the next business day, you can page your doctor at the number below.    Please note that while we do our best to be available for urgent issues outside of office hours, we are not available 24/7.   If you have an urgent issue and are unable to reach [place], you may choose to seek medical care at your doctor's office, retail clinic, urgent care center, or emergency room.  If you have a medical emergency, please immediately call 911 or go to the emergency department.  Pager Numbers  - Dr. Nehemiah Massed: [phone]  - Dr. Laurence Ferrari: [phone]  - Dr. Nicole Kindred: [phone]  In the event of inclement weather, please call our main line at [phone] for an update on the status of any delays or closures.  Dermatology Medication Tips: Please keep the boxes  that topical medications come in in order to help keep track of the instructions about where and how to use these. Pharmacies typically print the medication instructions only on the boxes and not directly on the medication tubes.   If your medication is too expensive, please contact our office at [phone] option 4 or send [place] a message through [place].   We are unable to tell what your co-pay for medications will be in advance as this is different depending on your insurance coverage. However, we may be able to find a substitute medication at lower cost or fill out paperwork to get insurance to cover a needed medication.   If a prior authorization is required to get your medication covered by your insurance company, please allow [place] 1-2 business days to complete this process.  Drug prices often vary depending on where the prescription is filled and some pharmacies may offer cheaper prices.  The website www.goodrx.com contains coupons for medications through different pharmacies. The prices here do not account for what the cost may be with help from insurance (it may be cheaper with your insurance), but the website can give you the price if you did not use any insurance.  - You can print the associated coupon and take it with your prescription to the pharmacy.  - You may also stop by our office during regular business hours and pick up a GoodRx coupon card.  - If you need your prescription sent electronically to a different pharmacy, notify our office through  [place] or by phone at [phone] option 4.

## 2021-07-12 NOTE — Progress Notes (Signed)
   Follow-Up Visit   Subjective  Sabrina Stanley is a 60 y.o. female who presents for the following: Follow-up (Patient here today concerning stasis dermatitis at legs for years. Reports legs itch constantly and drain. She was given prescription for clobetasol last year but is unable to remember if it helped. ).  The following portions of the chart were reviewed this encounter and updated as appropriate:  Tobacco  Allergies  Meds  Problems  Med Hx  Surg Hx  Fam Hx     Review of Systems: No other skin or systemic complaints except as noted in HPI or Assessment and Plan.   Objective  Well appearing patient in no apparent distress; mood and affect are within normal limits.  A focused examination was performed including bilateral lower extremities . Relevant physical exam findings are noted in the Assessment and Plan.  bilateral lower legs Blue gray stasis changes with some edema               Assessment & Plan  Stasis dermatitis of both legs with pruritus and xerosis and dyspigmentation with Schaumburg's purpura bilateral lower legs Stasis in the legs causes chronic leg swelling, which may result in itchy or painful rashes, skin discoloration, skin texture changes, and sometimes ulceration.  Recommend daily compression hose/stockings- easiest to put on first thing in morning, remove at bedtime.  Elevate legs as much as possible. Avoid salt/sodium rich foods.  Patient's arterial blood flow was checked by vascular surgeon and is ok.  Start Triamcinolone 0.1 % cream - apply topically to lower legs nightly Monday - Friday. Avoid applying to face, groin, and axilla. Use as directed.  Daily apply compression stockings in morning and remove at night   Will recheck in 2 months   If patient is doing well will start on protopic and continue with stockings  Topical steroids (such as triamcinolone, fluocinolone, fluocinonide, mometasone, clobetasol, halobetasol, betamethasone,  hydrocortisone) can cause thinning and lightening of the skin if they are used for too long in the same area. Your physician has selected the right strength medicine for your problem and area affected on the body. Please use your medication only as directed by your physician to prevent side effects.   triamcinolone cream (KENALOG) 0.1 % - bilateral lower legs Apply 1 application topically See admin instructions. Apply nightly to legs Monday - Friday only. Avoid applying to face, groin, and axilla. Use as directed.  Return for 2 month follow up on stasis dermatitis .  IRuthell Rummage, CMA, am acting as scribe for Sarina Ser, MD. Documentation: I have reviewed the above documentation for accuracy and completeness, and I agree with the above.  Sarina Ser, MD

## 2021-07-18 ENCOUNTER — Encounter: Payer: Self-pay | Admitting: Dermatology

## 2021-09-13 ENCOUNTER — Ambulatory Visit: Payer: Medicare Other | Admitting: Dermatology

## 2021-10-27 ENCOUNTER — Other Ambulatory Visit: Payer: Self-pay | Admitting: Internal Medicine

## 2021-10-27 DIAGNOSIS — Z1231 Encounter for screening mammogram for malignant neoplasm of breast: Secondary | ICD-10-CM

## 2021-11-17 ENCOUNTER — Other Ambulatory Visit: Payer: Self-pay | Admitting: *Deleted

## 2021-11-17 DIAGNOSIS — F17210 Nicotine dependence, cigarettes, uncomplicated: Secondary | ICD-10-CM

## 2021-11-17 DIAGNOSIS — Z87891 Personal history of nicotine dependence: Secondary | ICD-10-CM

## 2021-11-24 ENCOUNTER — Ambulatory Visit (INDEPENDENT_AMBULATORY_CARE_PROVIDER_SITE_OTHER): Payer: Medicare Other | Admitting: Acute Care

## 2021-11-24 ENCOUNTER — Other Ambulatory Visit: Payer: Self-pay

## 2021-11-24 ENCOUNTER — Ambulatory Visit
Admission: RE | Admit: 2021-11-24 | Discharge: 2021-11-24 | Disposition: A | Discharge status: Home / Self Care | Payer: Medicare Other | Source: Ambulatory Visit | Attending: Acute Care | Admitting: Acute Care

## 2021-11-24 ENCOUNTER — Encounter: Payer: Self-pay | Admitting: Acute Care

## 2021-11-24 DIAGNOSIS — Z87891 Personal history of nicotine dependence: Secondary | ICD-10-CM | POA: Diagnosis present

## 2021-11-24 DIAGNOSIS — F17210 Nicotine dependence, cigarettes, uncomplicated: Secondary | ICD-10-CM

## 2021-11-24 NOTE — Progress Notes (Signed)
Virtual Visit via Telephone Note ? ?I connected with Sabrina Stanley on 11/24/21 at 11:00 AM EDT by telephone and verified that I am speaking with the correct person using two identifiers. ? ?Location: ?Patient: At home ?Provider: Working from home ?  ?I discussed the limitations, risks, security and privacy concerns of performing an evaluation and management service by telephone and the availability of in person appointments. I also discussed with the patient that there may be a patient responsible charge related to this service. The patient expressed understanding and agreed to proceed. ? ? ?Shared Decision Making Visit Lung Cancer Screening Program ?([id number]) ? ? ?Eligibility: ?Age 61 y.o. ?Pack Years Smoking History Calculation 58.75 pack  ?(# packs/per year x # years smoked) ?Recent History of coughing up blood  no ?Unexplained weight loss? no ?( >Than 15 pounds within the last 6 months ) ?Prior History Lung / other cancer no ?(Diagnosis within the last 5 years already requiring surveillance chest CT Scans). ?Smoking Status Current Smoker ?Former Smokers: Years since quit:  NA ? Quit Date:  NA ? ?Visit Components: ?Discussion included one or more decision making aids. yes ?Discussion included risk/benefits of screening. yes ?Discussion included potential follow up diagnostic testing for abnormal scans. yes ?Discussion included meaning and risk of over diagnosis. yes ?Discussion included meaning and risk of False Positives. yes ?Discussion included meaning of total radiation exposure. yes ? ?Counseling Included: ?Importance of adherence to annual lung cancer LDCT screening. yes ?Impact of comorbidities on ability to participate in the program. yes ?Ability and willingness to under diagnostic treatment. yes ? ?Smoking Cessation Counseling: ?Current Smokers:  ?Discussed importance of smoking cessation. yes ?Information about tobacco cessation classes and interventions provided to patient. yes ?Patient provided with  "ticket" for LDCT Scan. NA ?Symptomatic Patient. no ? Counseling NA ?Diagnosis Code: Tobacco Use Z72.0 ?Asymptomatic Patient yes ? Counseling (Intermediate counseling: > three minutes counseling) U8891 ?Former Smokers:  ?Discussed the importance of maintaining cigarette abstinence. yes ?Diagnosis Code: Personal History of Nicotine Dependence. Q94.503 ?Information about tobacco cessation classes and interventions provided to patient. Yes ?Patient provided with "ticket" for LDCT Scan. yes ?Written Order for Lung Cancer Screening with LDCT placed in Epic. Yes ?(CT Chest Lung Cancer Screening Low Dose W/O CM) UUE2800 ?Z12.2-Screening of respiratory organs ?Z87.891-Personal history of nicotine dependence ? ?I have spent 25 minutes of face to face/ virtual visit   time with Sabrina Stanley discussing the risks and benefits of lung cancer screening. We viewed / discussed a power point together that explained in detail the above noted topics. We paused at intervals to allow for questions to be asked and answered to ensure understanding.We discussed that the single most powerful action that she can take to decrease her risk of developing lung cancer is to quit smoking. We discussed whether or not she is ready to commit to setting a quit date. We discussed options for tools to aid in quitting smoking including nicotine replacement therapy, non-nicotine medications, support groups, Quit Smart classes, and behavior modification. We discussed that often times setting smaller, more achievable goals, such as eliminating 1 cigarette a day for a week and then 2 cigarettes a day for a week can be helpful in slowly decreasing the number of cigarettes smoked. This allows for a sense of accomplishment as well as providing a clinical benefit. I provided  her  with smoking cessation  information  with contact information for community resources, classes, free nicotine replacement therapy, and access to mobile apps, text messaging,  and on-line  smoking cessation help. I have also provided  her  the office contact information in the event she needs to contact me, or the screening staff. We discussed the time and location of the scan, and that either Doroteo Glassman RN, Joella Prince, RN  or I will call / send a letter with the results within 24-72 hours of receiving them. The patient verbalized understanding of all of  the above and had no further questions upon leaving the office. They have my contact information in the event they have any further questions. ? ?I spent 5 minutes counseling on smoking cessation and the health risks of continued tobacco abuse. ? ?I explained to the patient that there has been a high incidence of coronary artery disease noted on these exams. I explained that this is a non-gated exam therefore degree or severity cannot be determined. This patient is on statin therapy. I have asked the patient to follow-up with their PCP regarding any incidental finding of coronary artery disease and management with diet or medication as their PCP  feels is clinically indicated. The patient verbalized understanding of the above and had no further questions upon completion of the visit. ? ?  ? ? ?Magdalen Spatz, NP ?11/24/2021 ? ? ? ? ? ? ?

## 2021-11-24 NOTE — Patient Instructions (Signed)
Thank you for participating in the [facility] Scre[facility]. It was our pleasure to meet you today. We will call you with the results of your scan within the next few days. Your scan will be assigned a Lung RADS category score by the physicians reading the scans.  This Lung RADS score determines follow up scanning.  See below for description of categories, and follow up screening recommendations. We will be in touch to schedule your follow up screening annually or based on recommendations of our providers. We will fax a copy of your scan results to your Primary Care Physician, or the physician who referred you to the program, to ensure they have the results. Please call the office if you have any questions or concerns regarding your scanning experience or results.  Our office number is [phone]. Please speak with Denise Phelps, RN. , or  Denise Buckner RN, They are  our Lung Cancer Screening RN.'s If They are unavailable when you call, Please leave a message on the voice mail. We will return your call at our earliest convenience.This voice mail is monitored several times a day.  Remember, if your scan is normal, we will scan you annually as long as you continue to meet the criteria for the program. (Age 55-77, Current smoker or smoker who has quit within the last 15 years). If you are a smoker, remember, quitting is the single most powerful action that you can take to decrease your risk of lung cancer and other pulmonary, breathing related problems. We know quitting is hard, and we are here to help.  Please let us know if there is anything we can do to help you meet your goal of quitting. If you are a former smoker, congratulations. We are proud of you! Remain smoke free! Remember you can refer friends or family members through the number above.  We will screen them to make sure they meet criteria for the program. Thank you for helping us take better care of you by  participating in Lung Screening.  You can receive free nicotine replacement therapy ( patches, gum or mints) by calling 1-800-QUIT NOW. Please call so we can get you on the path to becoming  a non-smoker. I know it is hard, but you can do this!  Lung RADS Categories:  Lung RADS 1: no nodules or definitely non-concerning nodules.  Recommendation is for a repeat annual scan in 12 months.  Lung RADS 2:  nodules that are non-concerning in appearance and behavior with a very low likelihood of becoming an active cancer. Recommendation is for a repeat annual scan in 12 months.  Lung RADS 3: nodules that are probably non-concerning , includes nodules with a low likelihood of becoming an active cancer.  Recommendation is for a 6-month repeat screening scan. Often noted after an upper respiratory illness. We will be in touch to make sure you have no questions, and to schedule your 6-month scan.  Lung RADS 4 A: nodules with concerning findings, recommendation is most often for a follow up scan in 3 months or additional testing based on our provider's assessment of the scan. We will be in touch to make sure you have no questions and to schedule the recommended 3 month follow up scan.  Lung RADS 4 B:  indicates findings that are concerning. We will be in touch with you to schedule additional diagnostic testing based on our provider's  assessment of the scan.  Other options for assistance in smoking cessation (   As covered by your insurance benefits)  Hypnosis for smoking cessation  [employer] [phone]  Acupuncture for smoking cessation  [facility] [phone]   

## 2021-11-26 ENCOUNTER — Other Ambulatory Visit: Payer: Self-pay

## 2021-11-26 DIAGNOSIS — Z87891 Personal history of nicotine dependence: Secondary | ICD-10-CM

## 2021-11-26 DIAGNOSIS — F17210 Nicotine dependence, cigarettes, uncomplicated: Secondary | ICD-10-CM

## 2021-12-02 ENCOUNTER — Ambulatory Visit
Admission: RE | Admit: 2021-12-02 | Discharge: 2021-12-02 | Disposition: A | Discharge status: Home / Self Care | Payer: Medicare Other | Source: Ambulatory Visit | Attending: Internal Medicine | Admitting: Internal Medicine

## 2021-12-02 DIAGNOSIS — Z1231 Encounter for screening mammogram for malignant neoplasm of breast: Secondary | ICD-10-CM | POA: Diagnosis present

## 2021-12-06 ENCOUNTER — Ambulatory Visit: Payer: Self-pay | Admitting: Urology

## 2022-01-10 ENCOUNTER — Other Ambulatory Visit: Payer: Self-pay | Admitting: *Deleted

## 2022-01-10 ENCOUNTER — Ambulatory Visit (INDEPENDENT_AMBULATORY_CARE_PROVIDER_SITE_OTHER): Payer: Medicare Other | Admitting: Urology

## 2022-01-10 VITALS — BP 106/69 | HR 70 | Ht 62.0 in | Wt 161.0 lb

## 2022-01-10 DIAGNOSIS — R351 Nocturia: Secondary | ICD-10-CM

## 2022-01-10 DIAGNOSIS — R3912 Poor urinary stream: Secondary | ICD-10-CM | POA: Diagnosis not present

## 2022-01-10 DIAGNOSIS — N3946 Mixed incontinence: Secondary | ICD-10-CM

## 2022-01-10 DIAGNOSIS — R350 Frequency of micturition: Secondary | ICD-10-CM

## 2022-01-10 LAB — URINALYSIS, COMPLETE
Bilirubin, UA: NEGATIVE
Glucose, UA: NEGATIVE
Ketones, UA: NEGATIVE
Leukocytes,UA: NEGATIVE
Nitrite, UA: NEGATIVE
Protein,UA: NEGATIVE
Specific Gravity, UA: 1.02 (ref 1.005–1.030)
Urobilinogen, Ur: 0.2 mg/dL (ref 0.2–1.0)
pH, UA: 7.5 (ref 5.0–7.5)

## 2022-01-10 LAB — MICROSCOPIC EXAMINATION

## 2022-01-10 MED ORDER — MIRABEGRON ER 50 MG PO TB24: 50.0000 mg | ORAL_TABLET | Freq: Every day | ORAL | 11 refills | Status: DC

## 2022-01-10 NOTE — Progress Notes (Signed)
? ?01/10/2022 ?1:17 PM  ? ?Sabrina Stanley ?January 31, 1961 ?[id number] ? ?Referring provider: Idelle Crouch, MD ?[facility],  [place] [address] ? ?No chief complaint on file. ? ? ?HPI: ?I was consulted to assess the patient's frequency.  She voids every hour and often a small amount.  She cannot hold it for 2 hours.  Gets up twice at night.  She is uncommon urge incontinence if she holds it too long.  She has uncommon stress incontinence.  She is leaked while she sleeping twice.  Does not wear a pad ? ?She said she will have the need to urinate and then sit for many minutes before she can urinate. ? ?She has had 2 neck operations and a hysterectomy ? ?Uribel was helping medicine but no longer is covered by her formulary ? ?She has a smoking history ? ?Years ago she was get bladder infections.  No kidney stone.  No bladder surgery. ? ? ?PMH: ?Past Medical History:  ?Diagnosis Date  ? Abnormal mammogram, unspecified   ? Alcoholism ([place])   ? Allergic state   ? Anemia   ? Anxiety   ? Arthritis   ? Bilateral breast cysts   ? Bipolar 1 disorder ([place])   ? Bipolar 1 disorder ([place])   ? Bone marrow disorder   ? Bronchitis   ? Cervical stenosis of spine   ? Cervicalgia   ? Chronic diarrhea   ? Chronic fatigue disorder   ? Chronic pain   ? COPD (chronic obstructive pulmonary disease) ([place])   ? COVID-19 05/2020  ? Depression   ? Depression   ? Eczema   ? Esophageal reflux   ? Fibromyalgia   ? GERD (gastroesophageal reflux disease)   ? Headache   ? Hiatal hernia   ? History of esophagogastroduodenoscopy (EGD)   ? Hyperlipidemia   ? Hyperlipidemia   ? Hypertension   ? Hypothyroidism   ? Insomnia   ? Lithium toxicity   ? Menstrual irregularity   ? Myalgia   ? Neuropathy   ? lower legs  ? Osteopenia   ? Osteopenia   ? Panic disorder   ? Pleurisy   ? Pneumonia   ? Psoriasis   ? PVD (peripheral vascular disease) ([place])   ? Sleep apnea   ? No CPAP  ? Tobacco abuse   ? UTI (urinary tract infection)   ?  Vitamin D deficiency   ? Wears dentures   ? partial lower  ? ? ?Surgical History: ?Past Surgical History:  ?Procedure Laterality Date  ? ABDOMINAL HYSTERECTOMY  1996  ? BREAST BIOPSY Right 08-21-12  ? FIBROADENOMATOUS CHANGE WITH CALCIFICATIONS.   ? BREAST BIOPSY Right   ? FIBROUS SCARRING, negative for atypia  ? CATARACT EXTRACTION W/PHACO Right 11/16/2020  ? Procedure: CATARACT EXTRACTION PHACO AND INTRAOCULAR LENS PLACEMENT ([place]) RIGHT;  Surgeon: Eulogio Bear, MD;  Location: [facility];  Service: Ophthalmology;  Laterality: Right;  0.77 ?0:12.9  ? CATARACT EXTRACTION W/PHACO Left 12/14/2020  ? Procedure: CATARACT EXTRACTION PHACO AND INTRAOCULAR LENS PLACEMENT ([place]) LEFT;  Surgeon: Eulogio Bear, MD;  Location: [facility];  Service: Ophthalmology;  Laterality: Left;  1.25 ?0:12.0  ? COLONOSCOPY    ? [place]  ? ESOPHAGOGASTRODUODENOSCOPY    ? ESOPHAGOGASTRODUODENOSCOPY N/A 09/17/2015  ? Procedure: ESOPHAGOGASTRODUODENOSCOPY (EGD);  Surgeon: Hulen Luster, MD;  Location: [facility] ENDOSCOPY;  Service: Gastroenterology;  Laterality: N/A;  ?  EYE SURGERY  2010  ? KNEE SURGERY Right 1998  ? meniscus repair  ? NASAL SINUS SURGERY  1998, 2000  ? NECK SURGERY    ? x 2  ? OOPHORECTOMY    ? [place]  2006, 2008  ? cervical fusion  ? TONSILLECTOMY    ? ? ?Home Medications:  ?Allergies as of 01/10/2022   ? ?   Reactions  ? Asa [aspirin]   ? Stomach pain  ? Codeine   ? Other reaction(s): Diarrhea and vomiting (finding)  ? Cyclobenzaprine Other (See Comments)  ? Other reaction(s): Altered mental status (finding), Unknown  ? Darvocet [propoxyphene N-acetaminophen]   ? Stomach pain  ? Effexor [venlafaxine Hcl] Other (See Comments)  ? hallucinations  ? Erythromycin Other (See Comments)  ? Other reaction(s): Distress (finding)  ? Meperidine-promethazine   ? Other reaction(s): Other (qualifier value) ?Causes internal itching  ? Neurontin [gabapentin]   ? fatigue  ? Nsaids   ?  Bad stomach ache  ? Propoxyphene   ? Other reaction(s): Other (qualifier value) ?causes constipation  ? Mobic [meloxicam] Palpitations  ? Penicillins Rash  ? ?  ? ?  ?Medication List  ?  ? ?  ? Accurate as of Jan 10, 2022  1:17 PM. If you have any questions, ask your nurse or doctor.  ?  ?  ? ?  ? ?Abilify 5 MG tablet ?Generic drug: ARIPiprazole ?5 mg daily. ?  ?Asenapine Maleate 10 MG Subl ?  ?atorvastatin 10 MG tablet ?Commonly known as: LIPITOR ?  ?benzonatate 200 MG capsule ?Commonly known as: TESSALON ?Take 200 mg by mouth 3 (three) times daily as needed for cough. ?  ?Biotin 5 MG Caps ?Take 1,000 mg by mouth daily. ?  ?bisoprolol-hydrochlorothiazide 2.5-6.25 MG tablet ?Commonly known as: ZIAC ?Take 1 tablet by mouth daily. ?  ?buPROPion 100 MG tablet ?Commonly known as: WELLBUTRIN ?Take 100 mg by mouth daily. ?  ?cyanocobalamin 1000 MCG/ML injection ?Commonly known as: (VITAMIN B-12) ?Inject into the muscle. ?  ?diclofenac sodium 1 % Gel ?Commonly known as: VOLTAREN ?Apply topically. ?  ?divalproex 500 MG 24 hr tablet ?Commonly known as: DEPAKOTE ER ?  ?FLUoxetine 20 MG capsule ?Commonly known as: PROZAC ?Take 60 mg by mouth daily. ?  ?Fluticasone-Salmeterol 100-50 MCG/DOSE Aepb ?Commonly known as: ADVAIR ?Inhale 1 puff into the lungs daily as needed. ?  ?furosemide 20 MG tablet ?Commonly known as: LASIX ?Take by mouth. ?  ?morphine 15 MG 12 hr tablet ?Commonly known as: MS CONTIN ?Limit 2-3 tabs by mouth per day if tolerated ?  ?Narcan 4 MG/0.1ML Liqd nasal spray kit ?Generic drug: naloxone ?Place into the nose. ?  ?ondansetron 4 MG disintegrating tablet ?Commonly known as: ZOFRAN-ODT ?  ?oxyCODONE-acetaminophen 7.5-325 MG tablet ?Commonly known as: PERCOCET ?Take 1 tablet by mouth every 4 (four) hours as needed for severe pain. ?  ?oxyCODONE-acetaminophen 10-325 MG tablet ?Commonly known as: PERCOCET ?Take 1 tablet by mouth every 4 (four) hours as needed. ?  ?potassium chloride SA 20 MEQ tablet ?Commonly  known as: KLOR-CON M ?Take 20 mEq by mouth daily. ?  ?primidone 50 MG tablet ?Commonly known as: MYSOLINE ?Take 50 mg by mouth 2 (two) times daily. ?  ?rOPINIRole 1 MG tablet ?Commonly known as: REQUIP ?Take 1 mg by mouth at bedtime. ?  ?SUMAtriptan 100 MG tablet ?Commonly known as: IMITREX ?Take 100 mg by mouth every 2 (two) hours as needed for migraine. ?  ?tiZANidine 4 MG tablet ?Commonly  known as: ZANAFLEX ?TAKE 1 TABLET BY MOUTH NIGHTLY ?  ?topiramate 200 MG tablet ?Commonly known as: TOPAMAX ?Take 200 mg by mouth daily. ?  ?triamcinolone cream 0.1 % ?Commonly known as: KENALOG ?Apply 1 application topically See admin instructions. Apply nightly to legs Monday - Friday only. Avoid applying to face, groin, and axilla. Use as directed. ?  ?Uribel 118 MG Caps ?Take by mouth. ?  ?Vitamin D (Ergocalciferol) 1.25 MG (50000 UNIT) Caps capsule ?Commonly known as: DRISDOL ?  ? ?  ? ? ?Allergies:  ?Allergies  ?Allergen Reactions  ? Asa [Aspirin]   ?  Stomach pain  ? Codeine   ?  Other reaction(s): Diarrhea and vomiting (finding)  ? Cyclobenzaprine Other (See Comments)  ?  Other reaction(s): Altered mental status (finding), Unknown  ? Darvocet [Propoxyphene N-Acetaminophen]   ?  Stomach pain  ? Effexor [Venlafaxine Hcl] Other (See Comments)  ?  hallucinations  ? Erythromycin Other (See Comments)  ?  Other reaction(s): Distress (finding)  ? Meperidine-Promethazine   ?  Other reaction(s): Other (qualifier value) ?Causes internal itching  ? Neurontin [Gabapentin]   ?  fatigue  ? Nsaids   ?  Bad stomach ache ?  ? Propoxyphene   ?  Other reaction(s): Other (qualifier value) ?causes constipation  ? Mobic [Meloxicam] Palpitations  ? Penicillins Rash  ? ? ?Family History: ?Family History  ?Problem Relation Age of Onset  ? Depression Mother   ? Hypertension Mother   ? Lung cancer Father   ? Cancer Father   ? Lung cancer Paternal Uncle   ? Cervical cancer Maternal Grandmother   ? Breast cancer Other   ? ? ?Social History:  reports  that she has been smoking cigarettes. She has a 58.75 pack-year smoking history. She has never used smokeless tobacco. She reports that she does not drink alcohol and does not use drugs. ? ?ROS: ?  ? ?  ?

## 2022-01-10 NOTE — Patient Instructions (Signed)

## 2022-01-13 LAB — CULTURE, URINE COMPREHENSIVE

## 2022-02-21 ENCOUNTER — Other Ambulatory Visit: Payer: Medicare Other | Admitting: Urology

## 2022-02-24 ENCOUNTER — Other Ambulatory Visit: Payer: Self-pay | Admitting: Obstetrics and Gynecology

## 2022-02-24 DIAGNOSIS — N828 Other female genital tract fistulae: Secondary | ICD-10-CM

## 2022-03-07 ENCOUNTER — Other Ambulatory Visit: Payer: Medicare Other | Admitting: Urology

## 2022-03-08 ENCOUNTER — Ambulatory Visit
Admission: RE | Admit: 2022-03-08 | Discharge: 2022-03-08 | Disposition: A | Discharge status: Home / Self Care | Payer: Medicare Other | Source: Ambulatory Visit | Attending: Obstetrics and Gynecology | Admitting: Obstetrics and Gynecology

## 2022-03-08 DIAGNOSIS — N828 Other female genital tract fistulae: Secondary | ICD-10-CM | POA: Insufficient documentation

## 2022-03-08 MED ORDER — IOHEXOL 300 MG/ML  SOLN
100.0000 mL | Freq: Once | INTRAMUSCULAR | Status: AC | PRN
  Administered 2022-03-08: 100 mL via INTRAVENOUS

## 2022-04-08 ENCOUNTER — Other Ambulatory Visit: Payer: Self-pay

## 2022-04-08 ENCOUNTER — Emergency Department: Payer: Medicare Other

## 2022-04-08 ENCOUNTER — Encounter: Payer: Self-pay | Admitting: Emergency Medicine

## 2022-04-08 DIAGNOSIS — L03115 Cellulitis of right lower limb: Secondary | ICD-10-CM | POA: Diagnosis not present

## 2022-04-08 DIAGNOSIS — Z8616 Personal history of COVID-19: Secondary | ICD-10-CM | POA: Diagnosis not present

## 2022-04-08 DIAGNOSIS — Z720 Tobacco use: Secondary | ICD-10-CM | POA: Diagnosis not present

## 2022-04-08 DIAGNOSIS — Z20822 Contact with and (suspected) exposure to covid-19: Secondary | ICD-10-CM | POA: Insufficient documentation

## 2022-04-08 DIAGNOSIS — E039 Hypothyroidism, unspecified: Secondary | ICD-10-CM | POA: Diagnosis not present

## 2022-04-08 DIAGNOSIS — J449 Chronic obstructive pulmonary disease, unspecified: Secondary | ICD-10-CM | POA: Insufficient documentation

## 2022-04-08 DIAGNOSIS — I10 Essential (primary) hypertension: Secondary | ICD-10-CM | POA: Insufficient documentation

## 2022-04-08 DIAGNOSIS — K449 Diaphragmatic hernia without obstruction or gangrene: Secondary | ICD-10-CM | POA: Insufficient documentation

## 2022-04-08 DIAGNOSIS — R600 Localized edema: Secondary | ICD-10-CM | POA: Diagnosis present

## 2022-04-08 LAB — CBC
HCT: 37 % (ref 36.0–46.0)
Hemoglobin: 11.5 g/dL — ABNORMAL LOW (ref 12.0–15.0)
MCH: 30.3 pg (ref 26.0–34.0)
MCHC: 31.1 g/dL (ref 30.0–36.0)
MCV: 97.6 fL (ref 80.0–100.0)
Platelets: 140 10*3/uL — ABNORMAL LOW (ref 150–400)
RBC: 3.79 MIL/uL — ABNORMAL LOW (ref 3.87–5.11)
RDW: 13 % (ref 11.5–15.5)
WBC: 6.2 10*3/uL (ref 4.0–10.5)
nRBC: 0 % (ref 0.0–0.2)

## 2022-04-08 LAB — BRAIN NATRIURETIC PEPTIDE: B Natriuretic Peptide: 35.7 pg/mL (ref 0.0–100.0)

## 2022-04-08 NOTE — ED Triage Notes (Signed)
Pt to ED via POV with c/o R foot swelling. Pt states has been on Prednisone '10mg'$  for 3 months, states last week her prednisone was decreased to '5mg'$ . Pt states increasing SOB and swelling and redness to R foot. Pt states feels like she can't get a deep breath. Pt states is "on strong narcotics from her pain doctor in [place]".

## 2022-04-09 ENCOUNTER — Emergency Department
Admission: EM | Admit: 2022-04-09 | Discharge: 2022-04-09 | Disposition: A | Discharge status: Home / Self Care | Payer: Medicare Other | Attending: Emergency Medicine | Admitting: Emergency Medicine

## 2022-04-09 ENCOUNTER — Emergency Department: Payer: Medicare Other

## 2022-04-09 DIAGNOSIS — L03115 Cellulitis of right lower limb: Secondary | ICD-10-CM

## 2022-04-09 DIAGNOSIS — Z720 Tobacco use: Secondary | ICD-10-CM

## 2022-04-09 DIAGNOSIS — K449 Diaphragmatic hernia without obstruction or gangrene: Secondary | ICD-10-CM

## 2022-04-09 LAB — TROPONIN I (HIGH SENSITIVITY)
Troponin I (High Sensitivity): 3 ng/L (ref <18)
Troponin I (High Sensitivity): 3 ng/L (ref <18)

## 2022-04-09 LAB — BASIC METABOLIC PANEL
Anion gap: 11 (ref 5–15)
BUN: 16 mg/dL (ref 8–23)
CO2: 23 mmol/L (ref 22–32)
Calcium: 9.2 mg/dL (ref 8.9–10.3)
Chloride: 105 mmol/L (ref 98–111)
Creatinine, Ser: 0.85 mg/dL (ref 0.44–1.00)
GFR, Estimated: >60 mL/min (ref >60)
Glucose, Bld: 98 mg/dL (ref 70–99)
Potassium: 3.7 mmol/L (ref 3.5–5.1)
Sodium: 139 mmol/L (ref 135–145)

## 2022-04-09 LAB — D-DIMER, QUANTITATIVE: D-Dimer, Quant: 0.65 ug/mL-FEU — ABNORMAL HIGH (ref 0.00–0.50)

## 2022-04-09 LAB — SARS CORONAVIRUS 2 BY RT PCR: SARS Coronavirus 2 by RT PCR: NEGATIVE

## 2022-04-09 MED ORDER — DOXYCYCLINE HYCLATE 100 MG PO CAPS: 100.0000 mg | ORAL_CAPSULE | Freq: Two times a day (BID) | ORAL | 0 refills | Status: AC

## 2022-04-09 MED ORDER — IOHEXOL 350 MG/ML SOLN
75.0000 mL | Freq: Once | INTRAVENOUS | Status: AC | PRN
  Administered 2022-04-09: 75 mL via INTRAVENOUS

## 2022-04-09 MED ORDER — LACTATED RINGERS IV BOLUS
1000.0000 mL | Freq: Once | INTRAVENOUS | Status: AC
  Administered 2022-04-09: 1000 mL via INTRAVENOUS

## 2022-04-09 NOTE — ED Notes (Addendum)
Pt to ED for R foot and leg swelling that started a couple days ago, pt states it is tender when she walks and tender upon palpation. Pt R leg edematous upon assessment Pt states she has hx of COPD and emphysema, and her SOB has gotten worse over the past week. Pt denies CP.  Pt is A&OX4.

## 2022-04-09 NOTE — ED Provider Notes (Signed)
[facility] Provider Note    Event Date/Time   First MD Initiated Contact with Patient 04/09/22 0230     (approximate)   History   Foot Swelling   HPI  Sabrina Stanley is a 61 y.o. female with a past medical history of COPD, ongoing tobacco abuse, HTN, HDL, bipolar disorder, PVD, and hypothyroidism who presents for evaluation of 2 complaints.  She states that about a week ago when her PCP decreased her prednisone from 10 mg to 5 mg/day which had been prescribed for COPD she developed some pain and redness in her right foot.  She denies any injuries.  She said since then she has also had a slight increase cough from baseline and some shortness of breath with exertion.  No chest pain, fevers, hemoptysis, headache earache, sore throat, abdominal pain, vomiting, diarrhea, urinary symptoms or any other extremity pain.  She denies any injuries.  She denies any illegal drug use.  She states an appointment to see her chronic pain specialist this coming week.  She also states she started amoxicillin 2 days ago for upcoming dental procedure.    Past Medical History:  Diagnosis Date   Abnormal mammogram, unspecified    Alcoholism ([place])    Allergic state    Anemia    Anxiety    Arthritis    Bilateral breast cysts    Bipolar 1 disorder (HCC)    Bipolar 1 disorder (HCC)    Bone marrow disorder    Bronchitis    Cervical stenosis of spine    Cervicalgia    Chronic diarrhea    Chronic fatigue disorder    Chronic pain    COPD (chronic obstructive pulmonary disease) ([place])    COVID-19 05/2020   Depression    Depression    Eczema    Esophageal reflux    Fibromyalgia    GERD (gastroesophageal reflux disease)    Headache    Hiatal hernia    History of esophagogastroduodenoscopy (EGD)    Hyperlipidemia    Hyperlipidemia    Hypertension    Hypothyroidism    Insomnia    Lithium toxicity    Menstrual irregularity    Myalgia    Neuropathy    lower legs    Osteopenia    Osteopenia    Panic disorder    Pleurisy    Pneumonia    Psoriasis    PVD (peripheral vascular disease) (HCC)    Sleep apnea    No CPAP   Tobacco abuse    UTI (urinary tract infection)    Vitamin D deficiency    Wears dentures    partial lower     Physical Exam  Triage Vital Signs: ED Triage Vitals  Enc Vitals Group     BP 04/08/22 2139 115/85     Pulse Rate 04/08/22 2139 90     Resp 04/08/22 2139 (!) 24     Temp 04/08/22 2139 99.3 F (37.4 C)     Temp Source 04/08/22 2139 Oral     SpO2 04/08/22 2139 100 %     Weight 04/08/22 2139 161 lb (73 kg)     Height 04/08/22 2139 '5\' 2"'$  (1.575 m)     Head Circumference --      Peak Flow --      Pain Score 04/08/22 2137 9     Pain Loc --      Pain Edu? --      Excl. in [place]? --  Most recent vital signs: Vitals:   04/09/22 0016 04/09/22 0334  BP: 116/69 (!) 97/58  Pulse: 77 69  Resp: 18 20  Temp: 99 F (37.2 C)   SpO2: 97% 100%    General: Awake, no distress.  CV:  Good peripheral perfusion.  2+ radial pulses.  Palpable bilateral PT pulses. Resp:  Normal effort.  Clear bilaterally. Abd:  No distention.  Soft throughout. Other:  Bilateral lower extremity edema slightly worse on the right with some erythema warmth and tenderness over the dorsum of the foot extending up around the lower calf.  She is able to flex and extend at the ankle there is no large effusion.  Sensation is intact to light touch throughout.  There is no fluctuance purulence or bleeding or any large deformities.   ED Results / Procedures / Treatments  Labs (all labs ordered are listed, but only abnormal results are displayed) Labs Reviewed  CBC - Abnormal; Notable for the following components:      Result Value   RBC 3.79 (*)    Hemoglobin 11.5 (*)    Platelets 140 (*)    All other components within normal limits  D-DIMER, QUANTITATIVE - Abnormal; Notable for the following components:   D-Dimer, Quant 0.65 (*)    All other  components within normal limits  SARS CORONAVIRUS 2 BY RT PCR  BASIC METABOLIC PANEL  BRAIN NATRIURETIC PEPTIDE  TROPONIN I (HIGH SENSITIVITY)  TROPONIN I (HIGH SENSITIVITY)     EKG  EKG is remarkable for sinus rhythm with ventricular rate of 82, normal axis, unremarkable intervals with nonspecific Q waves in lead III but no other significant evidence of acute ischemia or arrhythmia.   RADIOLOGY  Right lower extremity ultrasound interpretation without evidence of a DVT.  I also reviewed radiology's interpretation.  Chest reviewed by myself shows no focal consoidation, effusion, edema, pneumothorax or other clear acute thoracic process. I also reviewed radiology interpretation and agree with findings described.  CTA chest my interpretation without evidence of PE, focal consolidation, effusion, edema or pneumothorax although there is some compression of the right middle lobe.  I reviewed radiology interpretation and agree their findings of this without clear source including no evidence of a large compressive mass.  I also agree with radiology interpretation with small hiatal hernia and aortic atherosclerosis.  PROCEDURES:  Critical Care performed: No  Procedures   MEDICATIONS ORDERED IN ED: Medications  lactated ringers bolus 1,000 mL (1,000 mLs Intravenous New Bag/Given 04/09/22 0411)  iohexol (OMNIPAQUE) 350 MG/ML injection 75 mL (75 mLs Intravenous Contrast Given 04/09/22 0359)     IMPRESSION / MDM / ASSESSMENT AND PLAN / ED COURSE  I reviewed the triage vital signs and the nursing notes. Patient's presentation is most consistent with acute presentation with potential threat to life or bodily function.                               Differential diagnosis includes, but is not limited to right lower extremity DVT, acute arterial occlusion, cellulitis.  If this is a DVT this could be related to her shortness of breath and differential considerations for this include PE, CHF,  arrhythmia, anemia, pneumonia and COPD exacerbation although she is not hypoxic or in any significant respiratory distress and do not hear wheezing.  EKG is remarkable for sinus rhythm with ventricular rate of 82, normal axis, unremarkable intervals with nonspecific Q waves in lead III but  no other significant evidence of acute ischemia or arrhythmia.  Troponins x2 are nonelevated not suggestive of ACS.  Right lower extremity ultrasound interpretation without evidence of a DVT.  I also reviewed radiology's interpretation.  Chest reviewed by myself shows no focal consoidation, effusion, edema, pneumothorax or other clear acute thoracic process. I also reviewed radiology interpretation and agree with findings described.  CTA chest obtained due to elevated D-dimer my interpretation without evidence of PE, focal consolidation, effusion, edema or pneumothorax although there is some compression of the right middle lobe.  I reviewed radiology interpretation and agree their findings of this without clear source including no evidence of a large compressive mass.  I also agree with radiology interpretation with small hiatal hernia and aortic atherosclerosis.  BMP without any significant electrolyte or metabolic derangements.  CBC without leukocytosis or acute anemia.  COVID PCR negative.  I suspect a cellulitis in the right lower extremity.  I do not think patient is septic.  However increasing this of breath over the last week may be related to some collapse of the right middle lobe although she is not hypoxic or in evidence of rest or distress here.  I will provide her an incentive spirometer and I discussed appropriate use.  Recommended close outpatient pulmonology follow-up and she will call Dr. Raul Del on Monday.  Emphasized importance of returning for any new or worsening of symptoms.  I will broaden her amoxicillin to include doxycycline for her cellulitis.  Discharged in stable condition.  Strict return  precautions advised and discussed.  Counseled at length on tobacco cessation.     FINAL CLINICAL IMPRESSION(S) / ED DIAGNOSES   Final diagnoses:  Cellulitis of right lower extremity  Tobacco abuse  Hiatal hernia     Rx / DC Orders   ED Discharge Orders          Ordered    doxycycline (VIBRAMYCIN) 100 MG capsule  2 times daily        04/09/22 0445             Note:  This document was prepared using Dragon voice recognition software and may include unintentional dictation errors.   Lucrezia Starch, MD 04/09/22 [phone]

## 2022-04-09 NOTE — Discharge Instructions (Addendum)
Your CT today showed: IMPRESSION: 1. No evidence of pulmonary embolism. 2. Diffuse narrowing of the right middle lobe bronchus with right middle lobe collapse. 3. Small hiatal hernia.   Aortic Atherosclerosis (ICD10-I70.0).

## 2022-04-27 ENCOUNTER — Ambulatory Visit
Admission: RE | Admit: 2022-04-27 | Discharge: 2022-04-27 | Disposition: A | Discharge status: Home / Self Care | Payer: Medicare Other | Attending: Internal Medicine | Admitting: Internal Medicine

## 2022-04-27 ENCOUNTER — Ambulatory Visit: Payer: Medicare Other | Admitting: Certified Registered"

## 2022-04-27 ENCOUNTER — Encounter
Admission: RE | Disposition: A | Discharge status: Home / Self Care | Payer: Self-pay | Source: Home / Self Care | Attending: Internal Medicine

## 2022-04-27 ENCOUNTER — Encounter: Payer: Self-pay | Admitting: Internal Medicine

## 2022-04-27 DIAGNOSIS — K2970 Gastritis, unspecified, without bleeding: Secondary | ICD-10-CM | POA: Diagnosis not present

## 2022-04-27 DIAGNOSIS — K5989 Other specified functional intestinal disorders: Secondary | ICD-10-CM | POA: Diagnosis not present

## 2022-04-27 DIAGNOSIS — Z79899 Other long term (current) drug therapy: Secondary | ICD-10-CM | POA: Diagnosis not present

## 2022-04-27 DIAGNOSIS — K449 Diaphragmatic hernia without obstruction or gangrene: Secondary | ICD-10-CM | POA: Insufficient documentation

## 2022-04-27 DIAGNOSIS — G4730 Sleep apnea, unspecified: Secondary | ICD-10-CM | POA: Diagnosis not present

## 2022-04-27 DIAGNOSIS — D759 Disease of blood and blood-forming organs, unspecified: Secondary | ICD-10-CM | POA: Diagnosis not present

## 2022-04-27 DIAGNOSIS — Q438 Other specified congenital malformations of intestine: Secondary | ICD-10-CM | POA: Diagnosis not present

## 2022-04-27 DIAGNOSIS — I739 Peripheral vascular disease, unspecified: Secondary | ICD-10-CM | POA: Diagnosis not present

## 2022-04-27 DIAGNOSIS — K591 Functional diarrhea: Secondary | ICD-10-CM | POA: Insufficient documentation

## 2022-04-27 DIAGNOSIS — F319 Bipolar disorder, unspecified: Secondary | ICD-10-CM | POA: Diagnosis not present

## 2022-04-27 DIAGNOSIS — J449 Chronic obstructive pulmonary disease, unspecified: Secondary | ICD-10-CM | POA: Diagnosis not present

## 2022-04-27 DIAGNOSIS — D649 Anemia, unspecified: Secondary | ICD-10-CM | POA: Insufficient documentation

## 2022-04-27 DIAGNOSIS — R1030 Lower abdominal pain, unspecified: Secondary | ICD-10-CM | POA: Diagnosis not present

## 2022-04-27 DIAGNOSIS — K2100 Gastro-esophageal reflux disease with esophagitis, without bleeding: Secondary | ICD-10-CM | POA: Diagnosis not present

## 2022-04-27 DIAGNOSIS — I10 Essential (primary) hypertension: Secondary | ICD-10-CM | POA: Insufficient documentation

## 2022-04-27 DIAGNOSIS — F419 Anxiety disorder, unspecified: Secondary | ICD-10-CM | POA: Diagnosis not present

## 2022-04-27 HISTORY — PX: COLONOSCOPY: SHX5424

## 2022-04-27 HISTORY — PX: ESOPHAGOGASTRODUODENOSCOPY: SHX5428

## 2022-04-27 SURGERY — COLONOSCOPY
Anesthesia: General

## 2022-04-27 MED ORDER — PROPOFOL 10 MG/ML IV BOLUS
INTRAVENOUS | Status: DC | PRN
  Administered 2022-04-27: 70 mg via INTRAVENOUS
  Administered 2022-04-27: 10 mg via INTRAVENOUS
  Administered 2022-04-27: 20 mg via INTRAVENOUS
  Administered 2022-04-27: 30 mg via INTRAVENOUS

## 2022-04-27 MED ORDER — SODIUM CHLORIDE 0.9 % IV SOLN
INTRAVENOUS | Status: DC
  Administered 2022-04-27: 1000 mL via INTRAVENOUS

## 2022-04-27 MED ORDER — GLYCOPYRROLATE 0.2 MG/ML IJ SOLN
INTRAMUSCULAR | Status: DC | PRN
  Administered 2022-04-27: .2 mg via INTRAVENOUS

## 2022-04-27 MED ORDER — PROPOFOL 500 MG/50ML IV EMUL
INTRAVENOUS | Status: DC | PRN
  Administered 2022-04-27: 155 ug/kg/min via INTRAVENOUS

## 2022-04-27 MED ORDER — LIDOCAINE HCL (CARDIAC) PF 100 MG/5ML IV SOSY
PREFILLED_SYRINGE | INTRAVENOUS | Status: DC | PRN
  Administered 2022-04-27: 100 mg via INTRAVENOUS

## 2022-04-27 NOTE — Anesthesia Preprocedure Evaluation (Addendum)
Anesthesia Evaluation  Patient identified by MRN, date of birth, ID band Patient awake    Reviewed: Allergy & Precautions, NPO status , Patient's Chart, lab work & pertinent test results  Airway Mallampati: III  TM Distance: >3 FB Neck ROM: full    Dental  (+) Chipped   Pulmonary sleep apnea , COPD, Current Smoker and Patient abstained from smoking.,    Pulmonary exam normal        Cardiovascular Exercise Tolerance: Good hypertension, + Peripheral Vascular Disease  Normal cardiovascular exam  Normal Stress Echocardiogram  NORMAL RIGHT VENTRICULAR SYSTOLIC FUNCTION  MILD VALVULAR REGURGITATION (See above)  NO VALVULAR STENOSIS NOTED     Neuro/Psych PSYCHIATRIC DISORDERS Anxiety Depression Bipolar Disorder negative neurological ROS     GI/Hepatic Neg liver ROS, hiatal hernia (small), GERD  Controlled,  Endo/Other  Hypothyroidism   Renal/GU negative Renal ROS  negative genitourinary   Musculoskeletal   Abdominal   Peds  Hematology  (+) Blood dyscrasia, anemia ,   Anesthesia Other Findings Chronic steroid use this year  Past Medical History: No date: Abnormal mammogram, unspecified No date: Alcoholism ([place]) No date: Allergic state No date: Anemia No date: Anxiety No date: Arthritis No date: Bilateral breast cysts No date: Bipolar 1 disorder (HCC) No date: Bipolar 1 disorder (HCC) No date: Bone marrow disorder No date: Bronchitis No date: Cervical stenosis of spine No date: Cervicalgia No date: Chronic diarrhea No date: Chronic fatigue disorder No date: Chronic pain No date: COPD (chronic obstructive pulmonary disease) ([place]) 05/2020: COVID-19 No date: Depression No date: Depression No date: Eczema No date: Esophageal reflux No date: Fibromyalgia No date: GERD (gastroesophageal reflux disease) No date: Headache No date: Hiatal hernia No date: History of esophagogastroduodenoscopy (EGD) No date:  Hyperlipidemia No date: Hyperlipidemia No date: Hypertension No date: Hypothyroidism No date: Insomnia No date: Lithium toxicity No date: Menstrual irregularity No date: Myalgia No date: Neuropathy     Comment:  lower legs No date: Osteopenia No date: Osteopenia No date: Panic disorder No date: Pleurisy No date: Pneumonia No date: Psoriasis No date: PVD (peripheral vascular disease) (HCC) No date: Sleep apnea     Comment:  No CPAP No date: Tobacco abuse No date: UTI (urinary tract infection) No date: Vitamin D deficiency No date: Wears dentures     Comment:  partial lower  Past Surgical History: 1996: ABDOMINAL HYSTERECTOMY No date: BACK SURGERY 08/21/2012: BREAST BIOPSY; Right     Comment:  FIBROADENOMATOUS CHANGE WITH CALCIFICATIONS.  No date: BREAST BIOPSY; Right     Comment:  FIBROUS SCARRING, negative for atypia 11/16/2020: CATARACT EXTRACTION W/PHACO; Right     Comment:  Procedure: CATARACT EXTRACTION PHACO AND INTRAOCULAR               LENS PLACEMENT (IOC) RIGHT;  Surgeon: Eulogio Bear,              MD;  Location: [facility];  Service:               Ophthalmology;  Laterality: Right;  0.77 0:12.9 12/14/2020: CATARACT EXTRACTION W/PHACO; Left     Comment:  Procedure: CATARACT EXTRACTION PHACO AND INTRAOCULAR               LENS PLACEMENT ([place]) LEFT;  Surgeon: Eulogio Bear,               MD;  Location: [place] [place];  Service:  Ophthalmology;  Laterality: Left;  1.25 0:12.0 No date: COLONOSCOPY 1979, 1980: DILATION AND CURETTAGE OF UTERUS No date: ESOPHAGOGASTRODUODENOSCOPY 09/17/2015: ESOPHAGOGASTRODUODENOSCOPY; N/A     Comment:  Procedure: ESOPHAGOGASTRODUODENOSCOPY (EGD);  Surgeon:               Hulen Luster, MD;  Location: [facility] END[facility]COPY;  Service:               Gastroenterology;  Laterality: N/A; 2010: EYE SURGERY 1998: KNEE SURGERY; Right     Comment:  meniscus repair 1998, 2000: NASAL SINUS SURGERY No date: NECK  SURGERY     Comment:  x 2 No date: OOPHORECTOMY 2006, 2008: SPINE SURGERY     Comment:  cervical fusion No date: TONSILLECTOMY  BMI    Body Mass Index: 33.35 kg/m      Reproductive/Obstetrics negative OB ROS                           Anesthesia Physical Anesthesia Plan  ASA: 3  Anesthesia Plan: General   Post-op Pain Management: Minimal or no pain anticipated   Induction: Intravenous  PONV Risk Score and Plan: Propofol infusion and TIVA  Airway Management Planned: Natural Airway  Additional Equipment:   Intra-op Plan:   Post-operative Plan:   Informed Consent: I have reviewed the patients History and Physical, chart, labs and discussed the procedure including the risks, benefits and alternatives for the proposed anesthesia with the patient or authorized representative who has indicated his/her understanding and acceptance.     Dental advisory given  Plan Discussed with: Anesthesiologist, CRNA and Surgeon  Anesthesia Plan Comments:        Anesthesia Quick Evaluation

## 2022-04-27 NOTE — Op Note (Signed)
[facility] Gastroenterology Patient Name: Sabrina Stanley Procedure Date: 04/27/2022 12:12 PM MRN: [id number] Account #: [id number] Date of Birth: 1960/12/04 Admit Type: Outpatient Age: 61 Room: [facility] ENDO ROOM 2 Gender: Female Note Status: Finalized Instrument Name: Michaelle Birks [id number] Procedure:             Upper GI endoscopy Indications:           Lower abdominal pain, Gastro-esophageal reflux disease Providers:             Benay Pike. Alice Reichert MD, MD Referring MD:          Leonie Douglas. Doy Hutching, MD (Referring MD) Medicines:             Propofol per Anesthesia Complications:         No immediate complications. Procedure:             Pre-Anesthesia Assessment:                        - The risks and benefits of the procedure and the                         sedation options and risks were discussed with the                         patient. All questions were answered and informed                         consent was obtained.                        - Patient identification and proposed procedure were                         verified prior to the procedure by the nurse. The                         procedure was verified in the procedure room.                        - After reviewing the risks and benefits, the patient                         was deemed in satisfactory condition to undergo the                         procedure.                        - ASA Grade Assessment: III - A patient with severe                         systemic disease.                        After obtaining informed consent, the endoscope was                         passed under direct vision. Throughout the procedure,  the patient's blood pressure, pulse, and oxygen                         saturations were monitored continuously. The Endoscope                         was introduced through the mouth, and advanced to the                         third part of duodenum. The  upper GI endoscopy was                         accomplished without difficulty. The patient tolerated                         the procedure well. Findings:      The esophagus was normal.      Scattered mild inflammation characterized by congestion (edema) and       erythema was found in the gastric body and in the gastric antrum.       Biopsies were taken with a cold forceps for Helicobacter pylori testing.       Estimated blood loss: none.      A 1 cm hiatal hernia was present.      The examined duodenum was normal.      The exam was otherwise without abnormality. Impression:            - Normal esophagus.                        - Gastritis. Biopsied.                        - 1 cm hiatal hernia.                        - Normal examined duodenum.                        - The examination was otherwise normal. Recommendation:        - Await pathology results.                        - Proceed with colonoscopy Procedure Code(s):     --- Professional ---                        [phone], Esophagogastroduodenoscopy, flexible,                         transoral; with biopsy, single or multiple Diagnosis Code(s):     --- Professional ---                        K21.9, Gastro-esophageal reflux disease without                         esophagitis                        R10.30, Lower abdominal pain, unspecified  K44.9, Diaphragmatic hernia without obstruction or                         gangrene                        K29.70, Gastritis, unspecified, without bleeding CPT copyright 2019 American Medical Association. All rights reserved. The codes documented in this report are preliminary and upon coder review may  be revised to meet current compliance requirements. Efrain Sella MD, MD 04/27/2022 12:41:45 PM This report has been signed electronically. Number of Addenda: 0 Note Initiated On: 04/27/2022 12:12 PM Estimated Blood Loss:  Estimated blood loss: none.      [facility]

## 2022-04-27 NOTE — Anesthesia Postprocedure Evaluation (Signed)
Anesthesia Post Note  Patient: Sabrina Stanley  Procedure(s) Performed: COLONOSCOPY ESOPHAGOGASTRODUODENOSCOPY (EGD)  Patient location during evaluation: Endoscopy Anesthesia Type: General Level of consciousness: awake and alert Pain management: pain level controlled Vital Signs Assessment: post-procedure vital signs reviewed and stable Respiratory status: spontaneous breathing, nonlabored ventilation and respiratory function stable Cardiovascular status: blood pressure returned to baseline and stable Postop Assessment: no apparent nausea or vomiting Anesthetic complications: no   No notable events documented.   Last Vitals:  Vitals:   04/27/22 1310 04/27/22 1320  BP:    Pulse: 69 64  Resp:  20  Temp:    SpO2: 100% 100%    Last Pain:  Vitals:   04/27/22 1320  TempSrc:   PainSc: 0-No pain                 [place]

## 2022-04-27 NOTE — Interval H&P Note (Signed)
History and Physical Interval Note:  04/27/2022 12:20 PM  Sabrina Stanley  has presented today for surgery, with the diagnosis of Gastroesophageal reflux disease, unspecified whether esophagitis present (K21.9) Bilateral lower abdominal pain (R10.31,R10.32).  The various methods of treatment have been discussed with the patient and family. After consideration of risks, benefits and other options for treatment, the patient has consented to  Procedure(s): COLONOSCOPY (N/A) ESOPHAGOGASTRODUODENOSCOPY (EGD) (N/A) as a surgical intervention.  The patient's history has been reviewed, patient examined, no change in status, stable for surgery.  I have reviewed the patient's chart and labs.  Questions were answered to the patient's satisfaction.     [place], [place]

## 2022-04-27 NOTE — H&P (Signed)
Outpatient short stay form Pre-procedure 04/27/2022 12:15 PM Budd Freiermuth K. Alice Reichert, M.D.  Primary Physician: Fulton Reek, M.D.  Reason for visit:  GERD, change in bowel habits, functional diarrhea  History of present illness:  Diarrhea has now returned. She uses Imodium as needed. Differential would include microscopic colitis, IBD, less likely infectious given chronic symptoms. Her last colonoscopy was 12 years ago. GERD-symptomatic with pantoprazole 20 mg once a day. We will add on Pepcid. We did discuss her diet and she only drinks coffee energy drinks and sodas. She does not like water or any other liquids. Suspect a lot of symptoms are diet induced.   Current Facility-Administered Medications:    0.9 %  sodium chloride infusion, , Intravenous, Continuous, Dorette Hartel, Benay Pike, MD, Last Rate: 20 mL/hr at 04/27/22 1155, 1,000 mL at 04/27/22 1155  Facility-Administered Medications Prior to Admission  Medication Dose Route Frequency Provider Last Rate Last Admin   lidocaine (PF) (XYLOCAINE) 1 % injection 10 mL  10 mL Subcutaneous Once Mohammed Kindle, MD       midazolam (VERSED) 5 MG/5ML injection 5 mg  5 mg Intravenous Once Mohammed Kindle, MD       orphenadrine (NORFLEX) injection 60 mg  60 mg Intramuscular Once Mohammed Kindle, MD       Medications Prior to Admission  Medication Sig Dispense Refill Last Dose   ABILIFY 5 MG tablet 5 mg daily.   04/26/2022   Asenapine Maleate 10 MG SUBL    04/26/2022   atorvastatin (LIPITOR) 10 MG tablet    04/26/2022   Biotin 5 MG CAPS Take 1,000 mg by mouth daily.   04/26/2022   bisoprolol-hydrochlorothiazide (ZIAC) 2.5-6.25 MG tablet Take 1 tablet by mouth daily.   04/26/2022   buPROPion (WELLBUTRIN) 100 MG tablet Take 100 mg by mouth daily.   04/26/2022   cyanocobalamin (,VITAMIN B-12,) 1000 MCG/ML injection Inject into the muscle.   Past Week   diclofenac sodium (VOLTAREN) 1 % GEL Apply topically.   04/26/2022   divalproex (DEPAKOTE ER) 500 MG 24 hr tablet     04/26/2022   FLUoxetine (PROZAC) 20 MG capsule Take 60 mg by mouth daily.    04/26/2022   Fluticasone-Salmeterol (ADVAIR) 100-50 MCG/DOSE AEPB Inhale 1 puff into the lungs daily as needed.   04/26/2022   mirabegron ER (MYRBETRIQ) 50 MG TB24 tablet Take 1 tablet (50 mg total) by mouth daily. 30 tablet 11 04/26/2022   morphine (MS CONTIN) 15 MG 12 hr tablet Limit 2-3 tabs by mouth per day if tolerated 90 tablet 0 04/26/2022   ondansetron (ZOFRAN-ODT) 4 MG disintegrating tablet    04/26/2022   oxyCODONE-acetaminophen (PERCOCET) 10-325 MG tablet Take 1 tablet by mouth every 4 (four) hours as needed.   04/27/2022 at 0500   potassium chloride SA (K-DUR,KLOR-CON) 20 MEQ tablet Take 20 mEq by mouth daily.   04/26/2022   primidone (MYSOLINE) 50 MG tablet Take 50 mg by mouth 2 (two) times daily.   04/26/2022   rOPINIRole (REQUIP) 1 MG tablet Take 1 mg by mouth at bedtime.    04/26/2022   tiZANidine (ZANAFLEX) 4 MG tablet TAKE 1 TABLET BY MOUTH NIGHTLY   04/26/2022   topiramate (TOPAMAX) 200 MG tablet Take 200 mg by mouth daily.   04/26/2022   triamcinolone cream (KENALOG) 0.1 % Apply 1 application topically See admin instructions. Apply nightly to legs Monday - Friday only. Avoid applying to face, groin, and axilla. Use as directed. 453.6 g 2 04/26/2022   Vitamin D,  Ergocalciferol, (DRISDOL) 50000 units CAPS capsule    04/26/2022   benzonatate (TESSALON) 200 MG capsule Take 200 mg by mouth 3 (three) times daily as needed for cough. (Patient not taking: Reported on 04/27/2022)   Not Taking   furosemide (LASIX) 20 MG tablet Take by mouth.      Meth-Hyo-M Bl-Na Phos-Ph Sal (URIBEL) 118 MG CAPS Take by mouth. (Patient not taking: Reported on 01/10/2022)      naloxone (NARCAN) 4 MG/0.1ML LIQD nasal spray kit Place into the nose. (Patient not taking: Reported on 01/10/2022)      oxyCODONE-acetaminophen (PERCOCET) 7.5-325 MG tablet Take 1 tablet by mouth every 4 (four) hours as needed for severe pain.      SUMAtriptan (IMITREX)  100 MG tablet Take 100 mg by mouth every 2 (two) hours as needed for migraine.    at prn     Allergies  Allergen Reactions   Asa [Aspirin]     Stomach pain   Codeine     Other reaction(s): Diarrhea and vomiting (finding)   Cyclobenzaprine Other (See Comments)    Other reaction(s): Altered mental status (finding), Unknown   Darvocet [Propoxyphene N-Acetaminophen]     Stomach pain   Effexor [Venlafaxine Hcl] Other (See Comments)    hallucinations   Erythromycin Other (See Comments)    Other reaction(s): Distress (finding)   Meperidine-Promethazine     Other reaction(s): Other (qualifier value) Causes internal itching   Neurontin [Gabapentin]     fatigue   Nsaids     Bad stomach ache    Propoxyphene     Other reaction(s): Other (qualifier value) causes constipation   Mobic [Meloxicam] Palpitations   Penicillins Rash     Past Medical History:  Diagnosis Date   Abnormal mammogram, unspecified    Alcoholism ([place])    Allergic state    Anemia    Anxiety    Arthritis    Bilateral breast cysts    Bipolar 1 disorder (HCC)    Bipolar 1 disorder (HCC)    Bone marrow disorder    Bronchitis    Cervical stenosis of spine    Cervicalgia    Chronic diarrhea    Chronic fatigue disorder    Chronic pain    COPD (chronic obstructive pulmonary disease) ([place])    COVID-19 05/2020   Depression    Depression    Eczema    Esophageal reflux    Fibromyalgia    GERD (gastroesophageal reflux disease)    Headache    Hiatal hernia    History of esophagogastroduodenoscopy (EGD)    Hyperlipidemia    Hyperlipidemia    Hypertension    Hypothyroidism    Insomnia    Lithium toxicity    Menstrual irregularity    Myalgia    Neuropathy    lower legs   Osteopenia    Osteopenia    Panic disorder    Pleurisy    Pneumonia    Psoriasis    PVD (peripheral vascular disease) (HCC)    Sleep apnea    No CPAP   Tobacco abuse    UTI (urinary tract infection)    Vitamin D deficiency     Wears dentures    partial lower    Review of systems:  Otherwise negative.    Physical Exam  Gen: Alert, oriented. Appears stated age.  HEENT: [place]/AT. PERRLA. Lungs: CTA, no wheezes. CV: RR nl S1, S2. Abd: soft, benign, no masses. BS+ Ext: No edema. Pulses 2+  Planned procedures: Proceed with colonoscopy. The patient understands the nature of the planned procedure, indications, risks, alternatives and potential complications including but not limited to bleeding, infection, perforation, damage to internal organs and possible oversedation/side effects from anesthesia. The patient agrees and gives consent to proceed.  Please refer to procedure notes for findings, recommendations and patient disposition/instructions.     Saajan Willmon K. Alice Reichert, M.D. Gastroenterology 04/27/2022  12:15 PM

## 2022-04-27 NOTE — Op Note (Signed)
[facility] Patient Name: Sabrina Stanley Procedure Date: 04/27/2022 12:11 PM MRN: [id number] Account #: [id number] Date of Birth: 14-Apr-1961 Admit Type: Outpatient Age: 61 Room: [facility] ENDO ROOM 2 Gender: Female Note Status: Finalized Instrument Name: Jasper Riling [id number] Procedure:             Colonoscopy Indications:           Functional diarrhea Providers:             Benay Pike. Alice Reichert MD, MD Referring MD:          Leonie Douglas. Doy Hutching, MD (Referring MD) Medicines:             Propofol per Anesthesia Complications:         No immediate complications. Procedure:             Pre-Anesthesia Assessment:                        - The risks and benefits of the procedure and the                         sedation options and risks were discussed with the                         patient. All questions were answered and informed                         consent was obtained.                        - Patient identification and proposed procedure were                         verified prior to the procedure by the nurse. The                         procedure was verified in the procedure room.                        - ASA Grade Assessment: III - A patient with severe                         systemic disease.                        - After reviewing the risks and benefits, the patient                         was deemed in satisfactory condition to undergo the                         procedure.                        After obtaining informed consent, the colonoscope was                         passed under direct vision. Throughout the procedure,                         the patient's blood  pressure, pulse, and oxygen                         saturations were monitored continuously. The                         Colonoscope was introduced through the anus and                         advanced to the the cecum, identified by appendiceal                         orifice and  ileocecal valve. The patient tolerated the                         procedure well. The colonoscopy was somewhat difficult                         due to restricted mobility of the colon and                         significant looping. Successful completion of the                         procedure was aided by applying abdominal pressure.                         The patient tolerated the procedure well. The quality                         of the bowel preparation was adequate. The ileocecal                         valve, appendiceal orifice, and rectum were                         photographed. Findings:      The perianal and digital rectal examinations were normal. Pertinent       negatives include normal sphincter tone and no palpable rectal lesions.      The colon (entire examined portion) appeared normal. Biopsies for       histology were taken with a cold forceps from the random colon for       evaluation of microscopic colitis.      The retroflexed view of the distal rectum and anal verge was normal and       showed no anal or rectal abnormalities. Impression:            - The entire examined colon is normal. Biopsied. Recommendation:        - Patient has a contact number available for                         emergencies. The signs and symptoms of potential                         delayed complications were discussed with the patient.                         Return to normal  activities tomorrow. Written                         discharge instructions were provided to the patient.                        - Lactose free diet.                        - Await pathology results.                        - Return to physician assistant in 3 months.                        - Follow up with Laurine Blazer, PA-C at [facility]. [phone].                        - Telephone GI office to schedule appointment.                        - The findings and  recommendations were discussed with                         the patient. Procedure Code(s):     --- Professional ---                        [phone], Colonoscopy, flexible; with biopsy, single or                         multiple Diagnosis Code(s):     --- Professional ---                        K59.1, Functional diarrhea CPT copyright 2019 [employer]. All rights reserved. The codes documented in this report are preliminary and upon coder review may  be revised to meet current compliance requirements. Efrain Sella MD, MD 04/27/2022 1:00:39 PM This report has been signed electronically. Number of Addenda: 0 Note Initiated On: 04/27/2022 12:11 PM Scope Withdrawal Time: 0 hours 5 minutes 39 seconds  Total Procedure Duration: 0 hours 13 minutes 56 seconds  Estimated Blood Loss:  Estimated blood loss: none.      [facility]

## 2022-04-27 NOTE — Transfer of Care (Addendum)
Immediate Anesthesia Transfer of Care Note  Patient: Sabrina Stanley  Procedure(s) Performed: COLONOSCOPY ESOPHAGOGASTRODUODENOSCOPY (EGD)  Patient Location: Endoscopy Unit  Anesthesia Type:General  Level of Consciousness: awake, drowsy and patient cooperative  Airway & Oxygen Therapy: Patient Spontanous Breathing and Patient connected to face mask oxygen  Post-op Assessment: Report given to RN and Post -op Vital signs reviewed and stable  Post vital signs: Reviewed and stable  Last Vitals:  Vitals Value Taken Time  BP 152/78 04/27/22 1259  Temp    Pulse 72 04/27/22 1302  Resp 19 04/27/22 1303  SpO2 100 % 04/27/22 1302  Vitals shown include unvalidated device data.  Last Pain:  Vitals:   04/27/22 1300  TempSrc:   PainSc: Asleep         Complications: No notable events documented.

## 2022-04-27 NOTE — Anesthesia Procedure Notes (Signed)
Procedure Name: General with mask airway Date/Time: 04/27/2022 12:40 PM  Performed by: Kelton Pillar, CRNAPre-anesthesia Checklist: Patient identified, Emergency Drugs available, Suction available and Patient being monitored Patient Re-evaluated:Patient Re-evaluated prior to induction Oxygen Delivery Method: Simple face mask Induction Type: IV induction Placement Confirmation: positive ETCO2, CO2 detector and breath sounds checked- equal and bilateral Dental Injury: Teeth and Oropharynx as per pre-operative assessment

## 2022-04-28 ENCOUNTER — Encounter: Payer: Self-pay | Admitting: Internal Medicine

## 2022-04-29 LAB — SURGICAL PATHOLOGY

## 2022-05-13 ENCOUNTER — Ambulatory Visit (INDEPENDENT_AMBULATORY_CARE_PROVIDER_SITE_OTHER): Payer: Medicare Other | Admitting: Vascular Surgery

## 2022-06-03 ENCOUNTER — Ambulatory Visit (INDEPENDENT_AMBULATORY_CARE_PROVIDER_SITE_OTHER): Payer: Medicare Other | Admitting: Vascular Surgery

## 2022-06-03 ENCOUNTER — Encounter (INDEPENDENT_AMBULATORY_CARE_PROVIDER_SITE_OTHER): Payer: Self-pay | Admitting: Vascular Surgery

## 2022-06-03 VITALS — BP 112/74 | HR 79 | Resp 17 | Ht 62.0 in | Wt 161.6 lb

## 2022-06-03 DIAGNOSIS — E782 Mixed hyperlipidemia: Secondary | ICD-10-CM

## 2022-06-03 DIAGNOSIS — M79604 Pain in right leg: Secondary | ICD-10-CM

## 2022-06-03 DIAGNOSIS — I10 Essential (primary) hypertension: Secondary | ICD-10-CM | POA: Diagnosis not present

## 2022-06-03 DIAGNOSIS — I890 Lymphedema, not elsewhere classified: Secondary | ICD-10-CM | POA: Diagnosis not present

## 2022-06-03 DIAGNOSIS — M79605 Pain in left leg: Secondary | ICD-10-CM

## 2022-06-03 NOTE — Assessment & Plan Note (Signed)
blood pressure control important in reducing the progression of atherosclerotic disease. On appropriate oral medications.  

## 2022-06-03 NOTE — Assessment & Plan Note (Signed)
Regular using her compression socks, elevating her legs, and using the lymphedema pump has resulted in significant improvement in her lower extremity swelling symptoms.  She still has considerable pain but has had a negative arterial and venous work-up and with her swelling under control I do not think we can attribute a vascular cause to her pain.  Her primary care physician is going to be managing her pain therapy.  I will see her back as needed.

## 2022-06-03 NOTE — Assessment & Plan Note (Signed)
lipid control important in reducing the progression of atherosclerotic disease. Continue statin therapy  

## 2022-06-03 NOTE — Progress Notes (Signed)
MRN : [id number]  Sabrina Stanley is a 61 y.o. (07-09-1961) female who presents with chief complaint of No chief complaint on file. Marland Kitchen  History of Present Illness: Patient returns today in follow up of her leg pain and swelling.  She has been wearing her zippered compression socks, elevating her legs, and using her lymphedema pump.  This has resulted in marked improvement in the swelling in her legs.  She really does not have any appreciable swelling today.  She still complains of debilitating pain in both legs.  Her pain physician has retired and her primary care physician is taking over her narcotic regimen.  She asked me today about a muscle relaxer which I told her I am not sure would be a great idea and would certainly not be something I would prescribe.  Current Outpatient Medications  Medication Sig Dispense Refill   ABILIFY 5 MG tablet 5 mg daily.     Asenapine Maleate 10 MG SUBL      atorvastatin (LIPITOR) 10 MG tablet      Biotin 5 MG CAPS Take 1,000 mg by mouth daily.     bisoprolol-hydrochlorothiazide (ZIAC) 2.5-6.25 MG tablet Take 1 tablet by mouth daily.     buPROPion (WELLBUTRIN) 100 MG tablet Take 100 mg by mouth daily.     cyanocobalamin (,VITAMIN B-12,) 1000 MCG/ML injection Inject into the muscle.     diclofenac sodium (VOLTAREN) 1 % GEL Apply topically.     divalproex (DEPAKOTE ER) 500 MG 24 hr tablet      FLUoxetine (PROZAC) 20 MG capsule Take 60 mg by mouth daily.      Fluticasone-Salmeterol (ADVAIR) 100-50 MCG/DOSE AEPB Inhale 1 puff into the lungs daily as needed.     Meth-Hyo-M Bl-Na Phos-Ph Sal (URIBEL) 118 MG CAPS Take by mouth.     mirabegron ER (MYRBETRIQ) 50 MG TB24 tablet Take 1 tablet (50 mg total) by mouth daily. 30 tablet 11   morphine (MS CONTIN) 15 MG 12 hr tablet Limit 2-3 tabs by mouth per day if tolerated 90 tablet 0   naloxone (NARCAN) 4 MG/0.1ML LIQD nasal spray kit Place into the nose.     ondansetron (ZOFRAN-ODT) 4 MG disintegrating tablet       oxyCODONE-acetaminophen (PERCOCET) 10-325 MG tablet Take 1 tablet by mouth every 4 (four) hours as needed.     oxyCODONE-acetaminophen (PERCOCET) 7.5-325 MG tablet Take 1 tablet by mouth every 4 (four) hours as needed for severe pain.     potassium chloride SA (K-DUR,KLOR-CON) 20 MEQ tablet Take 20 mEq by mouth daily.     primidone (MYSOLINE) 50 MG tablet Take 50 mg by mouth 2 (two) times daily.     rOPINIRole (REQUIP) 1 MG tablet Take 1 mg by mouth at bedtime.      SUMAtriptan (IMITREX) 100 MG tablet Take 100 mg by mouth every 2 (two) hours as needed for migraine.     topiramate (TOPAMAX) 200 MG tablet Take 200 mg by mouth daily.     triamcinolone cream (KENALOG) 0.1 % Apply 1 application topically See admin instructions. Apply nightly to legs Monday - Friday only. Avoid applying to face, groin, and axilla. Use as directed. 453.6 g 2   Vitamin D, Ergocalciferol, (DRISDOL) 50000 units CAPS capsule      furosemide (LASIX) 20 MG tablet Take by mouth.     Current Facility-Administered Medications  Medication Dose Route Frequency Provider Last Rate Last Admin   lidocaine (PF) (XYLOCAINE) 1 % injection  10 mL  10 mL Subcutaneous Once Mohammed Kindle, MD       midazolam (VERSED) 5 MG/5ML injection 5 mg  5 mg Intravenous Once Mohammed Kindle, MD       orphenadrine (NORFLEX) injection 60 mg  60 mg Intramuscular Once Mohammed Kindle, MD        Past Medical History:  Diagnosis Date   Abnormal mammogram, unspecified    Alcoholism ([place])    Allergic state    Anemia    Anxiety    Arthritis    Bilateral breast cysts    Bipolar 1 disorder ([place])    Bipolar 1 disorder ([place])    Bone marrow disorder    Bronchitis    Cervical stenosis of spine    Cervicalgia    Chronic diarrhea    Chronic fatigue disorder    Chronic pain    COPD (chronic obstructive pulmonary disease) ([place])    COVID-19 05/2020   Depression    Depression    Eczema    Esophageal reflux    Fibromyalgia    GERD (gastroesophageal reflux  disease)    Headache    Hiatal hernia    History of esophagogastroduodenoscopy (EGD)    Hyperlipidemia    Hyperlipidemia    Hypertension    Hypothyroidism    Insomnia    Lithium toxicity    Menstrual irregularity    Myalgia    Neuropathy    lower legs   Osteopenia    Osteopenia    Panic disorder    Pleurisy    Pneumonia    Psoriasis    PVD (peripheral vascular disease) ([place])    Sleep apnea    No CPAP   Tobacco abuse    UTI (urinary tract infection)    Vitamin D deficiency    Wears dentures    partial lower    Past Surgical History:  Procedure Laterality Date   ABDOMINAL HYSTERECTOMY  1996   BACK SURGERY     BREAST BIOPSY Right 08/21/2012   FIBROADENOMATOUS CHANGE WITH CALCIFICATIONS.    BREAST BIOPSY Right    FIBROUS SCARRING, negative for atypia   CATARACT EXTRACTION W/PHACO Right 11/16/2020   Procedure: CATARACT EXTRACTION PHACO AND INTRAOCULAR LENS PLACEMENT (IOC) RIGHT;  Surgeon: Eulogio Bear, MD;  Location: [facility];  Service: Ophthalmology;  Laterality: Right;  0.77 0:12.9   CATARACT EXTRACTION W/PHACO Left 12/14/2020   Procedure: CATARACT EXTRACTION PHACO AND INTRAOCULAR LENS PLACEMENT (IOC) LEFT;  Surgeon: Eulogio Bear, MD;  Location: [facility];  Service: Ophthalmology;  Laterality: Left;  1.25 0:12.0   COLONOSCOPY     COLONOSCOPY N/A 04/27/2022   Procedure: COLONOSCOPY;  Surgeon: Toledo, Benay Pike, MD;  Location: [facility] ENDOSCOPY;  Service: Gastroenterology;  Laterality: N/A;   DILATION AND CURETTAGE OF UTERUS  1979, 1980   ESOPHAGOGASTRODUODENOSCOPY     ESOPHAGOGASTRODUODENOSCOPY N/A 09/17/2015   Procedure: ESOPHAGOGASTRODUODENOSCOPY (EGD);  Surgeon: Hulen Luster, MD;  Location: [facility] ENDOSCOPY;  Service: Gastroenterology;  Laterality: N/A;   ESOPHAGOGASTRODUODENOSCOPY N/A 04/27/2022   Procedure: ESOPHAGOGASTRODUODENOSCOPY (EGD);  Surgeon: Toledo, Benay Pike, MD;  Location: [facility] ENDOSCOPY;  Service: Gastroenterology;  Laterality:  N/A;   EYE SURGERY  2010   KNEE SURGERY Right 1998   meniscus repair   NASAL SINUS SURGERY  1998, 2000   NECK SURGERY     x 2   OOPHORECTOMY     SPINE SURGERY  2006, 2008   cervical fusion   TONSILLECTOMY  Social History   Tobacco Use   Smoking status: Every Day    Packs/day: 1.25    Years: 47.00    Total pack years: 58.75    Types: Cigarettes   Smokeless tobacco: Never   Tobacco comments:    started age 41  Vaping Use   Vaping Use: Former  Substance Use Topics   Alcohol use: No    Alcohol/week: 0.0 standard drinks of alcohol   Drug use: No      Family History  Problem Relation Age of Onset   Depression Mother    Hypertension Mother    Lung cancer Father    Cancer Father    Lung cancer Paternal Uncle    Cervical cancer Maternal Grandmother    Breast cancer Other      Allergies  Allergen Reactions   Asa [Aspirin]     Stomach pain   Codeine     Other reaction(s): Diarrhea and vomiting (finding)   Cyclobenzaprine Other (See Comments)    Other reaction(s): Altered mental status (finding), Unknown   Darvocet [Propoxyphene N-Acetaminophen]     Stomach pain   Effexor [Venlafaxine Hcl] Other (See Comments)    hallucinations   Erythromycin Other (See Comments)    Other reaction(s): Distress (finding)   Meperidine-Promethazine     Other reaction(s): Other (qualifier value) Causes internal itching   Neurontin [Gabapentin]     fatigue   Nsaids     Bad stomach ache    Propoxyphene     Other reaction(s): Other (qualifier value) causes constipation   Mobic [Meloxicam] Palpitations   Penicillins Rash    REVIEW OF SYSTEMS (Negative unless checked) Constitutional: []Weight loss  []Fever  []Chills Cardiac: []Chest pain   [] Atrial Fibrillation  []Palpitations   []Shortness of breath when laying flat   []Shortness of breath with exertion. []Shortness of breath at rest Vascular:  [x]Pain in legs with walking   [x]Pain in legs with standing []Pain in legs  when laying flat   []Claudication    []Pain in feet when laying flat    []History of DVT   []Phlebitis   [x]Swelling in legs   [x]Varicose veins   []Non-healing ulcers Pulmonary:   []Uses home oxygen   []Productive cough   []Hemoptysis   []Wheeze  []COPD   []Asthma Neurologic:  []Dizziness   []Seizures  []Blackouts []History of stroke   []History of TIA  []Aphasia   []Temporary Blindness   []Weakness or numbness in arm   [x]Weakness or numbness in leg Musculoskeletal:   []Joint swelling   []Joint pain   []Low back pain  [] History of Knee Replacement []Arthritis []back Surgeries  [] Spinal Stenosis    Hematologic:  []Easy bruising  []Easy bleeding   []Hypercoagulable state   []Anemic Gastrointestinal:  [x]Diarrhea   []Vomiting  [x]Gastroesophageal reflux/heartburn   []Difficulty swallowing. []Abdominal pain Genitourinary:  []Chronic kidney disease   []Difficult urination  []Anuric   []Blood in urine []Frequent urination  []Burning with urination   []Hematuria Skin:  []Rashes   []Ulcers []Wounds Psychological:  [x]History of anxiety   [x] History of major depression  [] Memory   Physical Examination  BP 112/74 (BP Location: Left Arm)   Pulse 79   Resp 17   Ht 5' 2" (1.575 m)   Wt 161 lb 9.6 oz (73.3 kg)   BMI 29.56 kg/m  Gen:  WD/WN, NAD Head: [place]/AT, No temporalis wasting. Ear/Nose/Throat: Hearing grossly intact, nares w/o erythema or drainage Eyes: Conjunctiva  clear. Sclera non-icteric Neck: Supple.  Trachea midline Pulmonary:  Good air movement, no use of accessory muscles.  Cardiac: RRR, no JVD Vascular:  Vessel Right Left  Radial Palpable Palpable                          PT Palpable Palpable  DP Palpable Palpable   Gastrointestinal: soft, non-tender/non-distended. No guarding/reflex.  Musculoskeletal: M/S 5/5 throughout.  No deformity or atrophy.  No appreciable edema. Neurologic: Sensation grossly intact in extremities.  Symmetrical.  Speech is fluent.  Psychiatric:  Judgment intact, Mood & affect appropriate for pt's clinical situation. Dermatologic: No rashes or ulcers noted.  No cellulitis or open wounds.      Labs Recent Results (from the past 2160 hour(s))  Basic metabolic panel     Status: None   Collection Time: 04/08/22  9:42 PM  Result Value Ref Range   Sodium 139 135 - 145 mmol/L   Potassium 3.7 3.5 - 5.1 mmol/L   Chloride 105 98 - 111 mmol/L   CO2 23 22 - 32 mmol/L   Glucose, Bld 98 70 - 99 mg/dL    Comment: Glucose reference range applies only to samples taken after fasting for at least 8 hours.   BUN 16 8 - 23 mg/dL   Creatinine, Ser 0.85 0.44 - 1.00 mg/dL   Calcium 9.2 8.9 - 10.3 mg/dL   GFR, Estimated >60 >60 mL/min    Comment: (NOTE) Calculated using the CKD-EPI Creatinine Equation (2021)    Anion gap 11 5 - 15    Comment: Performed at [facility], [address] [place], [place] [address]  CBC     Status: Abnormal   Collection Time: 04/08/22  9:42 PM  Result Value Ref Range   WBC 6.2 4.0 - 10.5 K/uL   RBC 3.79 (L) 3.87 - 5.11 MIL/uL   Hemoglobin 11.5 (L) 12.0 - 15.0 g/dL   HCT 37.0 36.0 - 46.0 %   MCV 97.6 80.0 - 100.0 fL   MCH 30.3 26.0 - 34.0 pg   MCHC 31.1 30.0 - 36.0 g/dL   RDW 13.0 11.5 - 15.5 %   Platelets 140 (L) 150 - 400 K/uL   nRBC 0.0 0.0 - 0.2 %    Comment: Performed at [facility], [place], [place] [address]  Troponin I (High Sensitivity)     Status: None   Collection Time: 04/08/22  9:42 PM  Result Value Ref Range   Troponin I (High Sensitivity) 3 <18 ng/L    Comment: (NOTE) Elevated high sensitivity troponin I (hsTnI) values and significant  changes across serial measurements may suggest ACS but many other  chronic and acute conditions are known to elevate hsTnI results.  Refer to the "Links" section for chest pain algorithms and additional  guidance. Performed at [facility], [address] [place], [place] [address]   Brain natriuretic  peptide     Status: None   Collection Time: 04/08/22  9:42 PM  Result Value Ref Range   B Natriuretic Peptide 35.7 0.0 - 100.0 pg/mL    Comment: Performed at [facility], [place], [place] [address]  Troponin I (High Sensitivity)     Status: None   Collection Time: 04/09/22 12:19 AM  Result Value Ref Range   Troponin I (High Sensitivity) 3 <18 ng/L    Comment: (NOTE) Elevated high sensitivity troponin I (hsTnI) values and significant  changes across serial measurements may suggest ACS but many other  chronic and acute conditions are known to elevate hsTnI results.  Refer to the "Links" section for chest pain algorithms and additional  guidance. Performed at [facility], [place] [address], [place], [place] [address]   D-dimer, quantitative     Status: Abnormal   Collection Time: 04/09/22  3:00 AM  Result Value Ref Range   D-Dimer, Quant 0.65 (H) 0.00 - 0.50 ug/mL-FEU    Comment: (NOTE) At the manufacturer cut-off value of 0.5 g/mL FEU, this assay has a negative predictive value of 95-100%.This assay is intended for use in conjunction with a clinical pretest probability (PTP) assessment model to exclude pulmonary embolism (PE) and deep venous thrombosis (DVT) in outpatients suspected of PE or DVT. Results should be correlated with clinical presentation. Performed at [facility], [address] [place], [place] [address]   SARS Coronavirus 2 by RT PCR (hospital order, performed in [facility] hospital lab) *cepheid single result test* Anterior Nasal Swab     Status: None   Collection Time: 04/09/22  3:00 AM   Specimen: Anterior Nasal Swab  Result Value Ref Range   SARS Coronavirus 2 by RT PCR NEGATIVE NEGATIVE    Comment: (NOTE) SARS-CoV-2 target nucleic acids are NOT DETECTED.  The SARS-CoV-2 RNA is generally detectable in upper and lower respiratory specimens during the acute phase of infection. The lowest concentration of SARS-CoV-2  viral copies this assay can detect is 250 copies / mL. A negative result does not preclude SARS-CoV-2 infection and should not be used as the sole basis for treatment or other patient management decisions.  A negative result may occur with improper specimen collection / handling, submission of specimen other than nasopharyngeal swab, presence of viral mutation(s) within the areas targeted by this assay, and inadequate number of viral copies (<250 copies / mL). A negative result must be combined with clinical observations, patient history, and epidemiological information.  Fact Sheet for Patients:   https://www.patel.info/  Fact Sheet for Healthcare Providers: https://hall.com/  This test is not yet approved or  cleared by the [place] FDA and has been authorized for detection and/or diagnosis of SARS-CoV-2 by FDA under an Emergency Use Authorization (EUA).  This EUA will remain in effect (meaning this test can be used) for the duration of the COVID-19 declaration under Section 564(b)(1) of the Act, 21 U.S.C. section 360bbb-3(b)(1), unless the authorization is terminated or revoked sooner.  Performed at [facility], [address] [place], [place] [address]   Surgical pathology     Status: None   Collection Time: 04/27/22 12:37 PM  Result Value Ref Range   SURGICAL PATHOLOGY      SURGICAL PATHOLOGY CASE: [id number] PATIENT: Thayer Headings Billing Surgical Pathology Report     Specimen Submitted: A. Stomach body; cbx B. Colon, random; cbx  Clinical History: Gastroesophageal reflux disease, unspecified whether esophagitis present )K21.9), Bilateral lower abdominal pain (R10.31, R10.32). Small hiatal hernia, gastritis, normal colonoscopy.      DIAGNOSIS: A.  STOMACH BODY; COLD BIOPSY: - ANTRAL MUCOSA WITH MILD REACTIVE GASTRITIS. - UNREMARKABLE OXYNTIC MUCOSA. - NEGATIVE FOR H. PYLORI, INTESTINAL METAPLASIA,  DYSPLASIA, AND MALIGNANCY.  B.  COLON, RANDOM; COLD BIOPSY: - UNREMARKABLE COLONIC MUCOSA. - NEGATIVE FOR MICROSCOPIC COLITIS, DYSPLASIA, AND MALIGNANCY.  GROSS DESCRIPTION: A. Labeled: Cbx antrum body rule out H. pylori Received: Formalin Collection time: 12:37 PM on 04/27/2022 Placed into formalin time: 12:37 PM on 04/27/2022 Tissue fragment(s): 3  Size: Aggregate, 0.5 x 0.3 x 0.1 cm Description: Tan soft tissue fragments Entirel y submitted in 1 cassette.  B. Labeled: Cbx random colon for chronic diarrhea rule out microscopic colitis Received: Formalin Collection time: 12:54 PM on 04/27/2022 Placed into formalin time: 12:54 PM on 04/27/2022 Tissue fragment(s): Multiple Size: Aggregate, 1.0 x 0.4 x 0.1 cm Description: Tan soft tissue fragments Entirely submitted in 1 cassette.  CM 04/27/2022  Final Diagnosis performed by Quay Burow, MD.   Electronically signed 04/29/2022 11:10:42AM The electronic signature indicates that the named Attending Pathologist has evaluated the specimen Technical component performed at [facility], [address], [place], [place] [address] Lab: [phone] Dir: Rush Farmer, MD, MMM  Professional component performed at [facility], [facility], [place], [place], [place] [address] Lab: [phone] Dir: Kathi Simpers, MD     Radiology No results found.  Assessment/Plan Benign essential hypertension blood pressure control important in reducing the progression of atherosclerotic disease. On appropriate oral medications.  Mixed hyperlipidemia lipid control important in reducing the progression of atherosclerotic disease. Continue statin therapy   Benign essential hypertension blood pressure control important in reducing the progression of atherosclerotic disease. On appropriate oral medications.   Lymphedema Regular using her compression socks, elevating her legs, and using the lymphedema pump has resulted in significant  improvement in her lower extremity swelling symptoms.  She still has considerable pain but has had a negative arterial and venous work-up and with her swelling under control I do not think we can attribute a vascular cause to her pain.  Her primary care physician is going to be managing her pain therapy.  I will see her back as needed.  Pain in limb Regular using her compression socks, elevating her legs, and using the lymphedema pump has resulted in significant improvement in her lower extremity swelling symptoms.  She still has considerable pain but has had a negative arterial and venous work-up and with her swelling under control I do not think we can attribute a vascular cause to her pain.  Her primary care physician is going to be managing her pain therapy.  I will see her back as needed.    Leotis Pain, MD  06/03/2022 11:33 AM    This note was created with Dragon medical transcription system.  Any errors from dictation are purely unintentional

## 2022-06-06 ENCOUNTER — Ambulatory Visit (INDEPENDENT_AMBULATORY_CARE_PROVIDER_SITE_OTHER): Payer: Medicare Other | Admitting: Urology

## 2022-06-06 ENCOUNTER — Encounter: Payer: Self-pay | Admitting: Urology

## 2022-06-06 VITALS — BP 99/65 | HR 82 | Ht 62.0 in | Wt 161.0 lb

## 2022-06-06 DIAGNOSIS — N3946 Mixed incontinence: Secondary | ICD-10-CM | POA: Diagnosis not present

## 2022-06-06 DIAGNOSIS — R350 Frequency of micturition: Secondary | ICD-10-CM

## 2022-06-06 MED ORDER — GEMTESA 75 MG PO TABS: 1.0000 | ORAL_TABLET | Freq: Every day | ORAL | 11 refills | Status: DC

## 2022-06-06 NOTE — Addendum Note (Signed)
Addended by: Despina Hidden on: 06/06/2022 11:50 AM   Modules accepted: Orders

## 2022-06-06 NOTE — Progress Notes (Signed)
06/06/2022 10:08 AM   Sabrina Stanley 02/06/1961 [id number]  Referring provider: Idelle Crouch, MD [facility] [place],  [place] [address]  Chief Complaint  Patient presents with   Cysto    HPI: I was consulted to assess the patient's frequency.  She voids every hour and often a small amount.  She cannot hold it for 2 hours.  Gets up twice at night.  She is uncommon urge incontinence if she holds it too long.  She has uncommon stress incontinence.  She is leaked while she sleeping twice.  Does not wear a pad   She said she will have the need to urinate and then sit for many minutes before she can urinate.  She has had 2 neck operations and a hysterectomy   Uribel was helping medicine but no longer is covered by her formulary   She has a smoking history    Patient has mild mixed incontinence with hourly frequency and mild nocturia.  She has urgency and then significant flow symptoms.  She has a smoking history.  No blood in urine.  I will see her back in approximately 6 weeks for examination and cystoscopy.  I gave her Myrbetriq 50 mg samples and prescription.  Call if culture positive  Today Frequency stable.  Last culture negative Minimal improvement with Myrbetriq and still has urgency and small volume voiding and double voiding.  Sometimes she lifts up on her right side to try to empty better  Well supported bladder neck and no prolapse.  Cystoscopy: Patient underwent flexible cystoscopy.  Bladder mucosa and trigone were normal.  No cystitis or carcinoma.  I did a careful look around the bladder.  Normal urethral axis      PMH: Past Medical History:  Diagnosis Date   Abnormal mammogram, unspecified    Alcoholism ([place])    Allergic state    Anemia    Anxiety    Arthritis    Bilateral breast cysts    Bipolar 1 disorder (HCC)    Bipolar 1 disorder (HCC)    Bone marrow disorder    Bronchitis    Cervical stenosis of spine     Cervicalgia    Chronic diarrhea    Chronic fatigue disorder    Chronic pain    COPD (chronic obstructive pulmonary disease) ([place])    COVID-19 05/2020   Depression    Depression    Eczema    Esophageal reflux    Fibromyalgia    GERD (gastroesophageal reflux disease)    Headache    Hiatal hernia    History of esophagogastroduodenoscopy (EGD)    Hyperlipidemia    Hyperlipidemia    Hypertension    Hypothyroidism    Insomnia    Lithium toxicity    Menstrual irregularity    Myalgia    Neuropathy    lower legs   Osteopenia    Osteopenia    Panic disorder    Pleurisy    Pneumonia    Psoriasis    PVD (peripheral vascular disease) (HCC)    Sleep apnea    No CPAP   Tobacco abuse    UTI (urinary tract infection)    Vitamin D deficiency    Wears dentures    partial lower    Surgical History: Past Surgical History:  Procedure Laterality Date   ABDOMINAL HYSTERECTOMY  1996   BACK SURGERY     BREAST BIOPSY Right 08/21/2012   FIBROADENOMATOUS CHANGE WITH CALCIFICATIONS.  BREAST BIOPSY Right    FIBROUS SCARRING, negative for atypia   CATARACT EXTRACTION W/PHACO Right 11/16/2020   Procedure: CATARACT EXTRACTION PHACO AND INTRAOCULAR LENS PLACEMENT (IOC) RIGHT;  Surgeon: Eulogio Bear, MD;  Location: [facility];  Service: Ophthalmology;  Laterality: Right;  0.77 0:12.9   CATARACT EXTRACTION W/PHACO Left 12/14/2020   Procedure: CATARACT EXTRACTION PHACO AND INTRAOCULAR LENS PLACEMENT (IOC) LEFT;  Surgeon: Eulogio Bear, MD;  Location: [facility];  Service: Ophthalmology;  Laterality: Left;  1.25 0:12.0   COLONOSCOPY     COLONOSCOPY N/A 04/27/2022   Procedure: COLONOSCOPY;  Surgeon: Toledo, Benay Pike, MD;  Location: [facility] ENDOSCOPY;  Service: Gastroenterology;  Laterality: N/A;   DILATION AND CURETTAGE OF UTERUS  1979, 1980   ESOPHAGOGASTRODUODENOSCOPY     ESOPHAGOGASTRODUODENOSCOPY N/A 09/17/2015   Procedure: ESOPHAGOGASTRODUODENOSCOPY (EGD);   Surgeon: Hulen Luster, MD;  Location: [facility] ENDOSCOPY;  Service: Gastroenterology;  Laterality: N/A;   ESOPHAGOGASTRODUODENOSCOPY N/A 04/27/2022   Procedure: ESOPHAGOGASTRODUODENOSCOPY (EGD);  Surgeon: Toledo, Benay Pike, MD;  Location: [facility] ENDOSCOPY;  Service: Gastroenterology;  Laterality: N/A;   EYE SURGERY  2010   KNEE SURGERY Right 1998   meniscus repair   NASAL SINUS SURGERY  1998, 2000   NECK SURGERY     x 2   OOPHORECTOMY     SPINE SURGERY  2006, 2008   cervical fusion   TONSILLECTOMY      Home Medications:  Allergies as of 06/06/2022       Reactions   Asa [aspirin]    Stomach pain   Codeine    Other reaction(s): Diarrhea and vomiting (finding)   Cyclobenzaprine Other (See Comments)   Other reaction(s): Altered mental status (finding), Unknown   Darvocet [propoxyphene N-acetaminophen]    Stomach pain   Effexor [venlafaxine Hcl] Other (See Comments)   hallucinations   Erythromycin Other (See Comments)   Other reaction(s): Distress (finding)   Meperidine-promethazine    Other reaction(s): Other (qualifier value) Causes internal itching   Neurontin [gabapentin]    fatigue   Nsaids    Bad stomach ache   Propoxyphene    Other reaction(s): Other (qualifier value) causes constipation   Mobic [meloxicam] Palpitations   Penicillins Rash        Medication List        Accurate as of June 06, 2022 10:08 AM. If you have any questions, ask your nurse or doctor.          Abilify 5 MG tablet Generic drug: ARIPiprazole 5 mg daily.   Asenapine Maleate 10 MG Subl   atorvastatin 10 MG tablet Commonly known as: LIPITOR   Biotin 5 MG Caps Take 1,000 mg by mouth daily.   bisoprolol-hydrochlorothiazide 2.5-6.25 MG tablet Commonly known as: ZIAC Take 1 tablet by mouth daily.   buPROPion 100 MG tablet Commonly known as: WELLBUTRIN Take 100 mg by mouth daily.   cyanocobalamin 1000 MCG/ML injection Commonly known as: VITAMIN B12 Inject into the muscle.    diclofenac sodium 1 % Gel Commonly known as: VOLTAREN Apply topically.   divalproex 500 MG 24 hr tablet Commonly known as: DEPAKOTE ER   FLUoxetine 20 MG capsule Commonly known as: PROZAC Take 60 mg by mouth daily.   Fluticasone-Salmeterol 100-50 MCG/DOSE Aepb Commonly known as: ADVAIR Inhale 1 puff into the lungs daily as needed.   furosemide 20 MG tablet Commonly known as: LASIX Take by mouth.   mirabegron ER 50 MG Tb24 tablet Commonly known as: MYRBETRIQ Take 1 tablet (  50 mg total) by mouth daily.   morphine 15 MG 12 hr tablet Commonly known as: MS CONTIN Limit 2-3 tabs by mouth per day if tolerated   Narcan 4 MG/0.1ML Liqd nasal spray kit Generic drug: naloxone Place into the nose.   ondansetron 4 MG disintegrating tablet Commonly known as: ZOFRAN-ODT   oxyCODONE-acetaminophen 7.5-325 MG tablet Commonly known as: PERCOCET Take 1 tablet by mouth every 4 (four) hours as needed for severe pain.   oxyCODONE-acetaminophen 10-325 MG tablet Commonly known as: PERCOCET Take 1 tablet by mouth every 4 (four) hours as needed.   potassium chloride SA 20 MEQ tablet Commonly known as: KLOR-CON M Take 20 mEq by mouth daily.   primidone 50 MG tablet Commonly known as: MYSOLINE Take 50 mg by mouth 2 (two) times daily.   rOPINIRole 1 MG tablet Commonly known as: REQUIP Take 1 mg by mouth at bedtime.   SUMAtriptan 100 MG tablet Commonly known as: IMITREX Take 100 mg by mouth every 2 (two) hours as needed for migraine.   topiramate 200 MG tablet Commonly known as: TOPAMAX Take 200 mg by mouth daily.   triamcinolone cream 0.1 % Commonly known as: KENALOG Apply 1 application topically See admin instructions. Apply nightly to legs Monday - Friday only. Avoid applying to face, groin, and axilla. Use as directed.   Uribel 118 MG Caps Take by mouth.   Vitamin D (Ergocalciferol) 1.25 MG (50000 UNIT) Caps capsule Commonly known as: DRISDOL        Allergies:   Allergies  Allergen Reactions   Asa [Aspirin]     Stomach pain   Codeine     Other reaction(s): Diarrhea and vomiting (finding)   Cyclobenzaprine Other (See Comments)    Other reaction(s): Altered mental status (finding), Unknown   Darvocet [Propoxyphene N-Acetaminophen]     Stomach pain   Effexor [Venlafaxine Hcl] Other (See Comments)    hallucinations   Erythromycin Other (See Comments)    Other reaction(s): Distress (finding)   Meperidine-Promethazine     Other reaction(s): Other (qualifier value) Causes internal itching   Neurontin [Gabapentin]     fatigue   Nsaids     Bad stomach ache    Propoxyphene     Other reaction(s): Other (qualifier value) causes constipation   Mobic [Meloxicam] Palpitations   Penicillins Rash    Family History: Family History  Problem Relation Age of Onset   Depression Mother    Hypertension Mother    Lung cancer Father    Cancer Father    Lung cancer Paternal Uncle    Cervical cancer Maternal Grandmother    Breast cancer Other     Social History:  reports that she has been smoking cigarettes. She has a 58.75 pack-year smoking history. She has been exposed to tobacco smoke. She has never used smokeless tobacco. She reports that she does not drink alcohol and does not use drugs.  ROS:                                        Physical Exam:   Laboratory Data: Lab Results  Component Value Date   WBC 6.2 04/08/2022   HGB 11.5 (L) 04/08/2022   HCT 37.0 04/08/2022   MCV 97.6 04/08/2022   PLT 140 (L) 04/08/2022    Lab Results  Component Value Date   CREATININE 0.85 04/08/2022    No results found  for: "PSA"  No results found for: "TESTOSTERONE"  No results found for: "HGBA1C"  Urinalysis    Component Value Date/Time   APPEARANCEUR Clear 01/10/2022 1319   GLUCOSEU Negative 01/10/2022 1319   BILIRUBINUR Negative 01/10/2022 1319   PROTEINUR Negative 01/10/2022 1319   NITRITE Negative 01/10/2022  1319   LEUKOCYTESUR Negative 01/10/2022 1319    Pertinent Imaging:   Assessment & Plan: Clinically patient has overactive bladder.  Role of physical therapy and [place] discussed.  Return in 6 weeks.  Symptoms are affecting her quality of life.  chose not to have physical therapy  There are no diagnoses linked to this encounter.  No follow-ups on file.  Reece Packer, MD  [facility] [address], [place], [place] [address] [phone]

## 2022-06-07 LAB — URINALYSIS, COMPLETE
Bilirubin, UA: NEGATIVE
Glucose, UA: NEGATIVE
Ketones, UA: NEGATIVE
Nitrite, UA: NEGATIVE
Specific Gravity, UA: 1.025 (ref 1.005–1.030)
Urobilinogen, Ur: 0.2 mg/dL (ref 0.2–1.0)
pH, UA: 6.5 (ref 5.0–7.5)

## 2022-06-07 LAB — MICROSCOPIC EXAMINATION

## 2022-06-09 ENCOUNTER — Other Ambulatory Visit (HOSPITAL_COMMUNITY): Payer: Self-pay | Admitting: Student

## 2022-06-09 DIAGNOSIS — G43719 Chronic migraine without aura, intractable, without status migrainosus: Secondary | ICD-10-CM

## 2022-06-09 DIAGNOSIS — G251 Drug-induced tremor: Secondary | ICD-10-CM

## 2022-06-09 DIAGNOSIS — G608 Other hereditary and idiopathic neuropathies: Secondary | ICD-10-CM

## 2022-06-09 LAB — CULTURE, URINE COMPREHENSIVE

## 2022-07-18 ENCOUNTER — Ambulatory Visit: Payer: Medicare Other | Admitting: Urology

## 2022-08-01 ENCOUNTER — Ambulatory Visit: Payer: Medicare Other | Admitting: Urology

## 2022-08-05 ENCOUNTER — Ambulatory Visit (HOSPITAL_COMMUNITY)
Admission: RE | Admit: 2022-08-05 | Discharge: 2022-08-05 | Disposition: A | Discharge status: Home / Self Care | Payer: Medicare Other | Source: Ambulatory Visit | Attending: Student | Admitting: Student

## 2022-08-05 DIAGNOSIS — G43719 Chronic migraine without aura, intractable, without status migrainosus: Secondary | ICD-10-CM | POA: Diagnosis present

## 2022-08-05 DIAGNOSIS — G608 Other hereditary and idiopathic neuropathies: Secondary | ICD-10-CM | POA: Insufficient documentation

## 2022-08-05 DIAGNOSIS — G251 Drug-induced tremor: Secondary | ICD-10-CM | POA: Insufficient documentation

## 2022-08-18 ENCOUNTER — Other Ambulatory Visit: Payer: Self-pay | Admitting: Internal Medicine

## 2022-08-18 DIAGNOSIS — R053 Chronic cough: Secondary | ICD-10-CM

## 2022-08-25 ENCOUNTER — Ambulatory Visit
Admission: RE | Admit: 2022-08-25 | Discharge: 2022-08-25 | Disposition: A | Discharge status: Home / Self Care | Payer: Medicare Other | Source: Ambulatory Visit | Attending: Internal Medicine | Admitting: Internal Medicine

## 2022-08-25 DIAGNOSIS — R053 Chronic cough: Secondary | ICD-10-CM | POA: Diagnosis present

## 2022-09-01 ENCOUNTER — Ambulatory Visit (INDEPENDENT_AMBULATORY_CARE_PROVIDER_SITE_OTHER): Payer: Medicare Other | Admitting: Dermatology

## 2022-09-01 ENCOUNTER — Encounter: Payer: Self-pay | Admitting: Dermatology

## 2022-09-01 VITALS — BP 127/77 | HR 81

## 2022-09-01 DIAGNOSIS — L309 Dermatitis, unspecified: Secondary | ICD-10-CM

## 2022-09-01 DIAGNOSIS — R400 Somnolence: Secondary | ICD-10-CM

## 2022-09-01 DIAGNOSIS — S0031XA Abrasion of nose, initial encounter: Secondary | ICD-10-CM

## 2022-09-01 DIAGNOSIS — R296 Repeated falls: Secondary | ICD-10-CM

## 2022-09-01 MED ORDER — CLOBETASOL PROPIONATE 0.05 % EX CREA: TOPICAL_CREAM | CUTANEOUS | 2 refills | Status: AC

## 2022-09-01 NOTE — Progress Notes (Signed)
Follow-Up Visit   Subjective  Sabrina Stanley is a 62 y.o. female who presents for the following: Rash (Dur: 1 year. Dry hands. Fissuring. Painful) and Skin Problem (Abrasion on nose. States she has fallen 3 times and hits nose each time. Has been using Neosporin on nose).  Getting into "sleepy trances" and falls asleep, falls forward. Has fallen 3 times recently and hit her nose.  The following portions of the chart were reviewed this encounter and updated as appropriate:  Tobacco  Allergies  Meds  Problems  Med Hx  Surg Hx  Fam Hx      Review of Systems: No other skin or systemic complaints except as noted in HPI or Assessment and Plan.   Objective  Well appearing patient in no apparent distress; mood and affect are within normal limits. She does appear tired and appears to have difficulty staying awake.  A focused examination was performed including hands, face. Relevant physical exam findings are noted in the Assessment and Plan.  B/L hands Marked xerosis, peeling and fissuring  nasal dorsum Erosion with focal superficial ulceration   Assessment & Plan  Hand dermatitis B/L hands  Chronic and persistent condition with duration or expected duration over one year. Condition is bothersome/symptomatic for patient. Currently flared.  Avoid washing hands in dish soap.  Use Dove Soap for Sensitive Skin or Lever 2000 to wash hands.  Wear gloves when washing dishes or doing wet work.  Start Clobetasol cream twice daily to hands up to 2 weeks. Cover with Aquaphor at bedtime, wear white cotton gloves to bed. Can apply Aquaphor several times daily to open cuts. Use Neutrogena [place] Hand cream over Clobetasol in morning.  Can moisturize with Neutrogena hand cream several times daily especially after hand washing.  Topical steroids (such as triamcinolone, fluocinolone, fluocinonide, mometasone, clobetasol, halobetasol, betamethasone, hydrocortisone) can cause thinning and  lightening of the skin if they are used for too long in the same area. Your physician has selected the right strength medicine for your problem and area affected on the body. Please use your medication only as directed by your physician to prevent side effects.   Gentle skin care reviewed.   clobetasol cream (TEMOVATE) 0.05 % - B/L hands Apply twice daily to hands up to 2 weeks as needed  Abrasion of nose, initial encounter nasal dorsum  Secondary to trauma from fall.  Clean with hypochlorous spray twice a day. Apply Vaseline Jelly or Aquaphor and cover with bandage. Do this until area has healed.  Recommend [facility] Hypochlorous Spray (found in the wound care section) OR Cln brand Acne or Sports wash. The [facility] Hypochlorous Spray can be sprayed on daily and left on. The Cln wash should be applied to the affected area daily for at least 30 seconds and then rinsed off. If you are using clindamycin solution or lotion or another topical antibiotic to treat acne, using a hypochlorous product may help lower the risk of antibiotic resistant bacteria.    Falls frequently Head - Anterior (Face)  Patient reports 3 recent falls onto her face secondary to falling asleep  Drowsiness Head - Anterior (Face)  Patient notes frequent episodes of dosing off while sitting up leading to falls. Will have her follow-up with Dr. Doy Hutching to see if this may be a medication side effect or other issue such as sleep apnea or other   Seborrheic Keratoses. Chest.  - Stuck-on, waxy, tan-brown papules and/or plaques  - Benign-appearing - Discussed benign etiology and  prognosis. - Observe - Call for any changes  Return in about 1 month (around 10/02/2022) for Hand and nose recheck. Loraine Maple, CMA, am acting as scribe for Forest Gleason, MD.  Documentation: I have reviewed the above documentation for accuracy and completeness, and I agree with the above.  Forest Gleason, MD

## 2022-09-01 NOTE — Patient Instructions (Addendum)
Hands: Avoid washing hands in dish soap.  Use Dove Soap for Sensitive Skin or Lever 2000 to wash hands.  Wear gloves when washing dishes or doing wet work.  Start Clobetasol cream twice daily to hands up to 2 weeks. Cover with Aquaphor at bedtime, wear white cotton gloves to bed. Can apply Aquaphor several times daily to open cuts. Use Neutrogena [place] Hand cream over Clobetasol in morning.  Can moisturize with Neutrogena hand cream several times daily.  Topical steroids (such as triamcinolone, fluocinolone, fluocinonide, mometasone, clobetasol, halobetasol, betamethasone, hydrocortisone) can cause thinning and lightening of the skin if they are used for too long in the same area. Your physician has selected the right strength medicine for your problem and area affected on the body. Please use your medication only as directed by your physician to prevent side effects.     Nose abrasion: Clean with hypochlorous spray twice a day. Apply Vaseline Jelly or Aquaphor and cover with bandage. Do this until area has healed.   ,Recommend [facility] Hypochlorous Spray (found in the wound care section) OR Cln brand Acne or Sports wash. The [facility] Hypochlorous Spray can be sprayed on daily and left on. The Cln wash should be applied to the affected area daily for at least 30 seconds and then rinsed off. If you are using clindamycin solution or lotion or another topical antibiotic to treat acne, using a hypochlorous product may help lower the risk of antibiotic resistant bacteria.      Gentle Skin Care Guide  1. Bathe no more than once a day.  2. Avoid bathing in hot water  3. Use a mild soap like Dove, Vanicream, Cetaphil, CeraVe. Can use Lever 2000 or Cetaphil antibacterial soap  4. Use soap only where you need it. On most days, use it under your arms, between your legs, and on your feet. Let the water rinse other areas unless visibly dirty.  5. When you get out of the bath/shower, use a  towel to gently blot your skin dry, don't rub it.  6. While your skin is still a little damp, apply a moisturizing cream such as Vanicream, CeraVe, Cetaphil, Eucerin, Sarna lotion or plain Vaseline Jelly. For hands apply Neutrogena [place] Hand Cream or Excipial Hand Cream.  7. Reapply moisturizer any time you start to itch or feel dry.  8. Sometimes using free and clear laundry detergents can be helpful. Fabric softener sheets should be avoided. Downy Free & Gentle liquid, or any liquid fabric softener that is free of dyes and perfumes, it acceptable to use  9. If your doctor has given you prescription creams you may apply moisturizers over them   Due to recent changes in healthcare laws, you may see results of your pathology and/or laboratory studies on MyChart before the doctors have had a chance to review them. We understand that in some cases there may be results that are confusing or concerning to you. Please understand that not all results are received at the same time and often the doctors may need to interpret multiple results in order to provide you with the best plan of care or course of treatment. Therefore, we ask that you please give [place] 2 business days to thoroughly review all your results before contacting the office for clarification. Should we see a critical lab result, you will be contacted sooner.   If You Need Anything After Your Visit  If you have any questions or concerns for your doctor, please call our main line  at [phone] and press option 4 to reach your doctor's medical assistant. If no one answers, please leave a voicemail as directed and we will return your call as soon as possible. Messages left after 4 pm will be answered the following business day.   You may also send [place] a message via [place]. We typically respond to MyChart messages within 1-2 business days.  For prescription refills, please ask your pharmacy to contact our office. Our fax number is  [phone].  If you have an urgent issue when the clinic is closed that cannot wait until the next business day, you can page your doctor at the number below.    Please note that while we do our best to be available for urgent issues outside of office hours, we are not available 24/7.   If you have an urgent issue and are unable to reach [place], you may choose to seek medical care at your doctor's office, retail clinic, urgent care center, or emergency room.  If you have a medical emergency, please immediately call 911 or go to the emergency department.  Pager Numbers  - Dr. Nehemiah Massed: [phone]  - Dr. Laurence Ferrari: [phone]  - Dr. Nicole Kindred: [phone]  In the event of inclement weather, please call our main line at [phone] for an update on the status of any delays or closures.  Dermatology Medication Tips: Please keep the boxes that topical medications come in in order to help keep track of the instructions about where and how to use these. Pharmacies typically print the medication instructions only on the boxes and not directly on the medication tubes.   If your medication is too expensive, please contact our office at [phone] option 4 or send [place] a message through [place].   We are unable to tell what your co-pay for medications will be in advance as this is different depending on your insurance coverage. However, we may be able to find a substitute medication at lower cost or fill out paperwork to get insurance to cover a needed medication.   If a prior authorization is required to get your medication covered by your insurance company, please allow [place] 1-2 business days to complete this process.  Drug prices often vary depending on where the prescription is filled and some pharmacies may offer cheaper prices.  The website www.goodrx.com contains coupons for medications through different pharmacies. The prices here do not account for what the cost may be with help from  insurance (it may be cheaper with your insurance), but the website can give you the price if you did not use any insurance.  - You can print the associated coupon and take it with your prescription to the pharmacy.  - You may also stop by our office during regular business hours and pick up a GoodRx coupon card.  - If you need your prescription sent electronically to a different pharmacy, notify our office through [facility] or by phone at [phone] option 4.     Si Usted Necesita Algo Despus de Su Visita  Tambin puede enviarnos un mensaje a travs de Pharmacist, community. Por lo general respondemos a los mensajes de MyChart en el transcurso de 1 a 2 das hbiles.  Para renovar recetas, por favor pida a su farmacia que se ponga en contacto con nuestra oficina. Harland Dingwall de fax es Early [phone].  Si tiene un asunto urgente cuando la clnica est cerrada y que no puede esperar hasta el siguiente da hbil, Hydrologist a su  doctor(a) al nmero que aparece a continuacin.   Por favor, tenga en cuenta que aunque hacemos todo lo posible para estar disponibles para asuntos urgentes fuera del horario de [place], no estamos disponibles las 24 horas del da, los 7 das de la [place].   Si tiene un problema urgente y no puede comunicarse con nosotros, puede optar por buscar atencin mdica  en el consultorio de su doctor(a), en una clnica privada, en un centro de atencin urgente o en una sala de emergencias.  Si tiene Engineering geologist, por favor llame inmediatamente al 911 o vaya a la sala de emergencias.  Nmeros de bper  - Dr. Nehemiah Massed: [phone]  - Dra. Moye: [phone]  - Dra. Nicole Kindred: [phone]  En caso de inclemencias del [place], por favor llame a Johnsie Kindred principal al [phone] para una actualizacin sobre el [place] de cualquier retraso o cierre.  Consejos para la medicacin en dermatologa: Por favor, guarde las cajas en las que vienen los  medicamentos de uso tpico para ayudarle a seguir las instrucciones sobre dnde y cmo usarlos. Las farmacias generalmente imprimen las instrucciones del medicamento slo en las cajas y no directamente en los tubos del [place].   Si su medicamento es muy caro, por favor, pngase en contacto con Zigmund Daniel llamando al [phone] y presione la opcin 4 o envenos un mensaje a travs de Pharmacist, community.   No podemos decirle cul ser su copago por los medicamentos por adelantado ya que esto es diferente dependiendo de la cobertura de su seguro. Sin embargo, es posible que podamos encontrar un medicamento sustituto a Electrical engineer un formulario para que el seguro cubra el medicamento que se considera necesario.   Si se requiere una autorizacin previa para que su compaa de seguros [place] su medicamento, por favor permtanos de 1 a 2 das hbiles para completar este proceso.  Los precios de los medicamentos varan con frecuencia dependiendo del Environmental consultant de dnde se surte la receta y alguna farmacias pueden ofrecer precios ms baratos.  El sitio web www.goodrx.com tiene cupones para medicamentos de Airline pilot. Los precios aqu no tienen en cuenta lo que podra costar con la ayuda del seguro (puede ser ms barato con su seguro), pero el sitio web puede darle el precio si no utiliz Research scientist (physical sciences).  - Puede imprimir el cupn correspondiente y llevarlo con su receta a la farmacia.  - Tambin puede pasar por nuestra oficina durante el horario de atencin regular y Charity fundraiser una tarjeta de cupones de GoodRx.  - Si necesita que su receta se enve electrnicamente a una farmacia diferente, informe a nuestra oficina a travs de MyChart de [place] o por telfono llamando al [phone] y presione la opcin 4.

## 2022-10-04 ENCOUNTER — Other Ambulatory Visit: Payer: Self-pay | Admitting: Pulmonary Disease

## 2022-10-04 DIAGNOSIS — J9819 Other pulmonary collapse: Secondary | ICD-10-CM

## 2022-10-04 DIAGNOSIS — J432 Centrilobular emphysema: Secondary | ICD-10-CM

## 2022-10-06 ENCOUNTER — Ambulatory Visit: Payer: Medicare Other | Admitting: Dermatology

## 2022-10-12 ENCOUNTER — Ambulatory Visit: Payer: Medicare Other

## 2022-11-23 ENCOUNTER — Other Ambulatory Visit: Payer: Self-pay | Admitting: Internal Medicine

## 2022-11-23 DIAGNOSIS — Z1231 Encounter for screening mammogram for malignant neoplasm of breast: Secondary | ICD-10-CM

## 2022-11-25 ENCOUNTER — Ambulatory Visit
Admission: RE | Admit: 2022-11-25 | Discharge: 2022-11-25 | Disposition: A | Discharge status: Home / Self Care | Payer: Medicare Other | Source: Ambulatory Visit | Attending: Internal Medicine | Admitting: Internal Medicine

## 2022-11-25 DIAGNOSIS — Z87891 Personal history of nicotine dependence: Secondary | ICD-10-CM | POA: Insufficient documentation

## 2022-11-25 DIAGNOSIS — F17210 Nicotine dependence, cigarettes, uncomplicated: Secondary | ICD-10-CM | POA: Insufficient documentation

## 2022-11-28 ENCOUNTER — Other Ambulatory Visit: Payer: Self-pay | Admitting: Acute Care

## 2022-11-28 DIAGNOSIS — Z122 Encounter for screening for malignant neoplasm of respiratory organs: Secondary | ICD-10-CM

## 2022-11-28 DIAGNOSIS — F17210 Nicotine dependence, cigarettes, uncomplicated: Secondary | ICD-10-CM

## 2022-11-28 DIAGNOSIS — Z87891 Personal history of nicotine dependence: Secondary | ICD-10-CM

## 2023-02-28 ENCOUNTER — Ambulatory Visit
Admission: RE | Admit: 2023-02-28 | Discharge: 2023-02-28 | Disposition: A | Discharge status: Home / Self Care | Payer: Medicare Other | Source: Ambulatory Visit | Attending: Internal Medicine | Admitting: Internal Medicine

## 2023-02-28 DIAGNOSIS — Z1231 Encounter for screening mammogram for malignant neoplasm of breast: Secondary | ICD-10-CM | POA: Insufficient documentation

## 2023-05-09 ENCOUNTER — Ambulatory Visit
Admission: RE | Admit: 2023-05-09 | Discharge: 2023-05-09 | Disposition: A | Discharge status: Home / Self Care | Payer: Medicare Other | Source: Ambulatory Visit | Attending: Acute Care | Admitting: Acute Care

## 2023-05-09 DIAGNOSIS — Z87891 Personal history of nicotine dependence: Secondary | ICD-10-CM | POA: Diagnosis present

## 2023-05-09 DIAGNOSIS — Z122 Encounter for screening for malignant neoplasm of respiratory organs: Secondary | ICD-10-CM | POA: Diagnosis present

## 2023-05-09 DIAGNOSIS — J9819 Other pulmonary collapse: Secondary | ICD-10-CM | POA: Insufficient documentation

## 2023-05-09 DIAGNOSIS — J432 Centrilobular emphysema: Secondary | ICD-10-CM | POA: Insufficient documentation

## 2023-05-09 DIAGNOSIS — F17210 Nicotine dependence, cigarettes, uncomplicated: Secondary | ICD-10-CM | POA: Diagnosis present

## 2023-05-15 ENCOUNTER — Other Ambulatory Visit: Payer: Self-pay | Admitting: Physician Assistant

## 2023-05-15 DIAGNOSIS — Z981 Arthrodesis status: Secondary | ICD-10-CM

## 2023-05-15 DIAGNOSIS — M542 Cervicalgia: Secondary | ICD-10-CM

## 2023-05-19 ENCOUNTER — Ambulatory Visit
Admission: RE | Admit: 2023-05-19 | Discharge: 2023-05-19 | Disposition: A | Discharge status: Home / Self Care | Payer: Medicare Other | Source: Ambulatory Visit | Attending: Physician Assistant | Admitting: Physician Assistant

## 2023-05-19 DIAGNOSIS — M542 Cervicalgia: Secondary | ICD-10-CM | POA: Diagnosis present

## 2023-05-19 DIAGNOSIS — Z981 Arthrodesis status: Secondary | ICD-10-CM | POA: Insufficient documentation

## 2023-05-30 ENCOUNTER — Other Ambulatory Visit: Payer: Self-pay | Admitting: Family Medicine

## 2023-05-30 ENCOUNTER — Inpatient Hospital Stay
Admission: RE | Admit: 2023-05-30 | Discharge: 2023-05-30 | Disposition: A | Discharge status: Home / Self Care | Payer: Self-pay | Source: Ambulatory Visit | Attending: Orthopedic Surgery | Admitting: Orthopedic Surgery

## 2023-05-30 DIAGNOSIS — Z049 Encounter for examination and observation for unspecified reason: Secondary | ICD-10-CM

## 2023-06-01 NOTE — Progress Notes (Signed)
Referring Physician:  Marguarite Arbour, MD [address] [facility] [place],  [place] [address]  Primary Physician:  Marguarite Arbour, MD  History of Present Illness: 06/05/2023 Sabrina Stanley has a history of COPD, hypothyroidism, hyperlipidemia, bipolar, FM, HTN, OSA, PVD, history of ETOH abuse, and chronic pain.   History of cervical fusion maybe 10 years. She did well after her surgery.  She is a poor historian.   She had a fall around 05/08/23 and had increased neck and mid back pain with some radiation to her shoulders. Pain starts at bra strap and goes up. She feels like pain is getting worse, not better. Pain is much worse with driving. No arm pain. She has intermittent right hand pain (more in her palm)- this can throb like a toothache. She notes some weakness in both hands along with dexterity and balance issues. Not sure how long these have been going on.   She is on MS Contin and percocet 10 from her PCP ([place]). Was given prednisone on 05/11/23 along with robaxin- this hasn't helped much.   Bowel/Bladder Dysfunction: none  Conservative measures:  Physical therapy: no  Multimodal medical therapy including regular antiinflammatories: MS Contin, percocet, prednisone, robaxin  Injections: no epidural steroid injections  Past Surgery:  Cervical fusion about 10 years ago.   Bilateral ulnar nerve transpostions  Dolores Lory has symptoms of cervical myelopathy.  The symptoms are causing a significant impact on the patient's life.   Review of Systems:  A 10 point review of systems is negative, except for the pertinent positives and negatives detailed in the HPI.  Past Medical History: Past Medical History:  Diagnosis Date   Abnormal mammogram, unspecified    Alcoholism (HCC)    Allergic state    Anemia    Anxiety    Arthritis    Bilateral breast cysts    Bipolar 1 disorder (HCC)    Bipolar 1 disorder (HCC)    Bone marrow disorder     Bronchitis    Cervical stenosis of spine    Cervicalgia    Chronic diarrhea    Chronic fatigue disorder    Chronic pain    COPD (chronic obstructive pulmonary disease) (HCC)    COVID-19 05/2020   Depression    Depression    Eczema    Esophageal reflux    Fibromyalgia    GERD (gastroesophageal reflux disease)    Headache    Hiatal hernia    History of esophagogastroduodenoscopy (EGD)    Hyperlipidemia    Hyperlipidemia    Hypertension    Hypothyroidism    Insomnia    Lithium toxicity    Menstrual irregularity    Myalgia    Neuropathy    lower legs   Osteopenia    Osteopenia    Panic disorder    Pleurisy    Pneumonia    Psoriasis    PVD (peripheral vascular disease) (HCC)    Sleep apnea    No CPAP   Tobacco abuse    UTI (urinary tract infection)    Vitamin D deficiency    Wears dentures    partial lower    Past Surgical History: Past Surgical History:  Procedure Laterality Date   ABDOMINAL HYSTERECTOMY  1996   BACK SURGERY     BREAST BIOPSY Right 08/21/2012   FIBROADENOMATOUS CHANGE WITH CALCIFICATIONS.    BREAST BIOPSY Right    FIBROUS SCARRING, negative for atypia   CATARACT EXTRACTION W/PHACO  Right 11/16/2020   Procedure: CATARACT EXTRACTION PHACO AND INTRAOCULAR LENS PLACEMENT (IOC) RIGHT;  Surgeon: Nevada Crane, MD;  Location: [facility];  Service: Ophthalmology;  Laterality: Right;  0.77 0:12.9   CATARACT EXTRACTION W/PHACO Left 12/14/2020   Procedure: CATARACT EXTRACTION PHACO AND INTRAOCULAR LENS PLACEMENT (IOC) LEFT;  Surgeon: Nevada Crane, MD;  Location: [facility];  Service: Ophthalmology;  Laterality: Left;  1.25 0:12.0   COLONOSCOPY     COLONOSCOPY N/A 04/27/2022   Procedure: COLONOSCOPY;  Surgeon: Toledo, Boykin Nearing, MD;  Location: [facility] ENDOSCOPY;  Service: Gastroenterology;  Laterality: N/A;   DILATION AND CURETTAGE OF UTERUS  1979, 1980   ESOPHAGOGASTRODUODENOSCOPY     ESOPHAGOGASTRODUODENOSCOPY N/A 09/17/2015    Procedure: ESOPHAGOGASTRODUODENOSCOPY (EGD);  Surgeon: Wallace Cullens, MD;  Location: [facility] ENDOSCOPY;  Service: Gastroenterology;  Laterality: N/A;   ESOPHAGOGASTRODUODENOSCOPY N/A 04/27/2022   Procedure: ESOPHAGOGASTRODUODENOSCOPY (EGD);  Surgeon: Toledo, Boykin Nearing, MD;  Location: [facility] ENDOSCOPY;  Service: Gastroenterology;  Laterality: N/A;   EYE SURGERY  2010   KNEE SURGERY Right 1998   meniscus repair   NASAL SINUS SURGERY  1998, 2000   NECK SURGERY     x 2   OOPHORECTOMY     SPINE SURGERY  2006, 2008   cervical fusion   TONSILLECTOMY      Allergies: Allergies as of 06/05/2023 - Review Complete 06/05/2023  Allergen Reaction Noted   Asa [aspirin]  12/27/2012   Codeine     Cyclobenzaprine Other (See Comments) 01/06/2014   Darvocet [propoxyphene n-acetaminophen]  12/27/2012   Effexor [venlafaxine hcl] Other (See Comments) 12/27/2012   Erythromycin Other (See Comments) 01/06/2014   Meperidine-promethazine     Neurontin [gabapentin]  12/27/2012   Nsaids  09/02/2013   Propoxyphene     Mobic [meloxicam] Palpitations 12/27/2012   Penicillins Rash 12/27/2012    Medications: Outpatient Encounter Medications as of 06/05/2023  Medication Sig   albuterol (VENTOLIN HFA) 108 (90 Base) MCG/ACT inhaler Inhale 2 puffs into the lungs every 6 (six) hours as needed.   atorvastatin (LIPITOR) 10 MG tablet    Biotin 5 MG CAPS Take 1,000 mg by mouth daily.   bisoprolol-hydrochlorothiazide (ZIAC) 2.5-6.25 MG tablet Take 1 tablet by mouth daily.   buPROPion (WELLBUTRIN) 100 MG tablet Take 100 mg by mouth daily.   Cholecalciferol 125 MCG (5000 UT) TABS Take by mouth.   cyanocobalamin (,VITAMIN B-12,) 1000 MCG/ML injection Inject into the muscle.   diclofenac sodium (VOLTAREN) 1 % GEL Apply topically.   divalproex (DEPAKOTE ER) 500 MG 24 hr tablet    esomeprazole (NEXIUM) 40 MG capsule Take 40 mg by mouth 2 (two) times daily.   famotidine (PEPCID) 40 MG tablet Take 40 mg by mouth daily.   FLUoxetine  (PROZAC) 20 MG capsule Take 60 mg by mouth daily.    Fluticasone-Salmeterol (ADVAIR) 100-50 MCG/DOSE AEPB Inhale 1 puff into the lungs daily as needed.   methocarbamol (ROBAXIN) 750 MG tablet Take 750 mg by mouth 3 (three) times daily.   modafinil (PROVIGIL) 200 MG tablet Take 200 mg by mouth 2 (two) times daily.   montelukast (SINGULAIR) 10 MG tablet Take 10 mg by mouth daily.   morphine (MS CONTIN) 15 MG 12 hr tablet Limit 2-3 tabs by mouth per day if tolerated   naloxone (NARCAN) 4 MG/0.1ML LIQD nasal spray kit Place into the nose.   ondansetron (ZOFRAN-ODT) 4 MG disintegrating tablet    oxyCODONE-acetaminophen (PERCOCET) 10-325 MG tablet Take 1 tablet by mouth every  4 (four) hours as needed.   potassium chloride SA (K-DUR,KLOR-CON) 20 MEQ tablet Take 20 mEq by mouth daily.   primidone (MYSOLINE) 50 MG tablet Take 50 mg by mouth 2 (two) times daily.   rOPINIRole (REQUIP) 1 MG tablet Take 1 mg by mouth at bedtime.    SUMAtriptan (IMITREX) 100 MG tablet Take 100 mg by mouth every 2 (two) hours as needed for migraine.   topiramate (TOPAMAX) 200 MG tablet Take 200 mg by mouth daily.   triamcinolone cream (KENALOG) 0.1 % Apply 1 application topically See admin instructions. Apply nightly to legs Monday - Friday only. Avoid applying to face, groin, and axilla. Use as directed.   Vitamin D, Ergocalciferol, (DRISDOL) 50000 units CAPS capsule    clobetasol cream (TEMOVATE) 0.05 % Apply twice daily to hands up to 2 weeks as needed (Patient not taking: Reported on 06/05/2023)   furosemide (LASIX) 20 MG tablet Take by mouth. (Patient not taking: Reported on 06/05/2023)   Meth-Hyo-M Bl-Na Phos-Ph Sal (URIBEL) 118 MG CAPS Take by mouth. (Patient not taking: Reported on 06/05/2023)   [DISCONTINUED] ABILIFY 5 MG tablet 5 mg daily.   [DISCONTINUED] Asenapine Maleate 10 MG SUBL    [DISCONTINUED] oxyCODONE-acetaminophen (PERCOCET) 7.5-325 MG tablet Take 1 tablet by mouth every 4 (four) hours as needed for severe  pain. (Patient not taking: Reported on 06/05/2023)   [DISCONTINUED] Vibegron (GEMTESA) 75 MG TABS Take 1 tablet by mouth daily.   Facility-Administered Encounter Medications as of 06/05/2023  Medication   lidocaine (PF) (XYLOCAINE) 1 % injection 10 mL   midazolam (VERSED) 5 MG/5ML injection 5 mg   orphenadrine (NORFLEX) injection 60 mg    Social History: Social History   Tobacco Use   Smoking status: Every Day    Current packs/day: 1.25    Average packs/day: 1.3 packs/day for 47.0 years (58.8 ttl pk-yrs)    Types: Cigarettes    Passive exposure: Current   Smokeless tobacco: Never   Tobacco comments:    started age 28  Vaping Use   Vaping status: Former  Substance Use Topics   Alcohol use: No    Alcohol/week: 0.0 standard drinks of alcohol   Drug use: No    Family Medical History: Family History  Problem Relation Age of Onset   Depression Mother    Hypertension Mother    Lung cancer Father    Cancer Father    Lung cancer Paternal Uncle    Cervical cancer Maternal Grandmother    Breast cancer Other     Physical Examination: Vitals:   06/05/23 1119  BP: 122/84    General: Patient is well developed, well nourished, calm, collected, and in no apparent distress. Attention to examination is appropriate.  Respiratory: Patient is breathing without any difficulty.   NEUROLOGICAL:     Awake, alert, oriented to person, place, and time.  Speech is clear and fluent. Fund of knowledge is appropriate.   Cranial Nerves: Pupils equal round and reactive to light.  Facial tone is symmetric.    She has mild posterior cervical and upper posterior thoracic tenderness.   No abnormal lesions on exposed skin.   Strength: Side Biceps Triceps Deltoid Interossei Grip Wrist Ext. Wrist Flex.  R 4- 4- 3 4- 4 3 3   L 4+ 4+ 4+ 4+ 4+ 4- 4-   Side Iliopsoas Quads Hamstring PF DF EHL  R 5 5 5 5 5 5   L 5 5 5 5 5 5    She does not give  a good effort on strength testing.   Reflexes are  3+ and symmetric at the biceps, brachioradialis, patella and achilles.   Hoffman's is absent.  Clonus is not present.   Bilateral upper and lower extremity sensation is intact to light touch.    She has limited ROM of both shoulders along with pain in right shoulder.   She has slightly unsteady gait.   Medical Decision Making  Imaging: Cervical MRI dated 05/20/23:  FINDINGS: Alignment: There is trace retrolisthesis of C3 on C4 and trace anterolisthesis of C7 on T1, not significantly changed.   Vertebrae: There is mild compression deformity of the superior T3 and T4 endplates with mild marrow edema consistent with subacute fractures, new since 11/25/2022 and not significantly changed since 05/09/2023. There is no bony retropulsion at either level.   Cervical vertebral body heights are preserved, without other evidence of acute fracture. The patient is status post fusion from C4 through C7 with ACDF hardware remaining at C4-C5. There is no suspicious marrow signal abnormality or other marrow edema.   Cord: Normal in signal and morphology.   Posterior Fossa, vertebral arteries, paraspinal tissues: The imaged posterior fossa is unremarkable. The vertebral artery flow voids are normal. The paraspinal soft tissues are unremarkable.   Disc levels:   Evaluation of the neural foramina is degraded by motion artifact. Within this confine:   C2-C3: There is mild uncovertebral ridging and moderate left worse than right facet arthropathy resulting in probable moderate left worse than right neural foraminal stenosis without significant spinal canal stenosis which appears progressed since 2014.   C3-C4: There is trace retrolisthesis with a broad-based posterior disc osteophyte complex, prominent bilateral uncovertebral ridging, and mild bilateral facet arthropathy resulting in mild spinal canal stenosis and probable severe right worse than left neural foraminal stenosis, likely  progressed since 2014.   C4-C5: Status post ACDF without residual spinal canal or neural foraminal stenosis   C5-C6: Status post fusion without residual spinal canal or neural foraminal stenosis.   C6-C7: Status post fusion without significant spinal canal or neural foraminal stenosis.   C7-T1: There is trace anterolisthesis with left worse than right facet arthropathy and mild endplate spurring resulting in moderate to severe left and mild right neural foraminal stenosis without significant spinal canal stenosis, progressed since 2014.   IMPRESSION: 1. Subacute compression fractures of the superior T3 and T4 endplates with mild marrow edema and no retropulsion, not significantly changed since 05/09/2023. 2. Status post fusion from C4 through C7 without residual spinal canal or neural foraminal stenosis at the surgical levels. 3. 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 4. Moderate left worse than right neural foraminal stenosis at C2-C3, also likely progressed since 2014. 5. Moderate to severe left and mild right neural foraminal stenosis at C7-T1, progressed.     Electronically Signed   By: Lesia Hausen M.D.   On: 05/25/2023 16:31    I have personally reviewed the images and agree with the above interpretation.  Above imaging reviewed with Dr. Katrinka Blazing prior to her visit.   Assessment and Plan: Sabrina Stanley is a pleasant 62 y.o. female is a poor historian.   History of cervical fusion maybe 10 years. She did well after her surgery.  She had a fall around 05/08/23 and had increased neck and mid back pain with some radiation to her shoulders. Pain starts at bra strap and  goes up. She feels like pain is getting worse, not better. She notes some weakness in both hands along with dexterity and balance issues. Not sure how long these have been going on.    She has known compression fractures at T3 and T4- this is likely cause of her thoracic pain.   She is fused C4-C7- she has at least mild central stenosis C3-C4 with severe right > left foraminal stenosis.   She does not give good effort with strength testing and has apparent diffuse weakness in right > left upper extremities. She is hyperreflexic. Negative hoffmans. She has balance and dexterity issues.   Treatment options discussed with Dr. Katrinka Blazing and following plan made with patient:   - No further treatment for compression fractures at T3 and T4- these should heal. No surgery recommended.  - Cervical xrays with flexion and extension ordered.  - EMG of bilateral upper extremities to evaluate bilateral upper extremity weakness. Sent to Dr. Sherryll Burger at her request.  - Continue on pain medications (MS Contin, percocet) from PCP.  - Will review xrays and EMG with Dr. Katrinka Blazing and then determine follow up. Will ask him about xray follow up of T3 and T4 compression fractures at that time as well.   I spent a total of 35 minutes in face-to-face and non-face-to-face activities related to this patient's care today including review of outside records, review of imaging, review of symptoms, physical exam, discussion of differential diagnosis, discussion of treatment options, and documentation.   Thank you for involving me in the care of this patient.   Drake Leach PA-C Dept. of Neurosurgery

## 2023-06-05 ENCOUNTER — Ambulatory Visit
Admission: RE | Admit: 2023-06-05 | Discharge: 2023-06-05 | Disposition: A | Discharge status: Home / Self Care | Payer: Medicare Other | Source: Ambulatory Visit | Attending: Orthopedic Surgery | Admitting: Orthopedic Surgery

## 2023-06-05 ENCOUNTER — Ambulatory Visit
Admission: RE | Admit: 2023-06-05 | Discharge: 2023-06-05 | Disposition: A | Discharge status: Home / Self Care | Payer: Medicare Other | Attending: Orthopedic Surgery | Admitting: Orthopedic Surgery

## 2023-06-05 ENCOUNTER — Encounter: Payer: Self-pay | Admitting: Orthopedic Surgery

## 2023-06-05 ENCOUNTER — Telehealth: Payer: Self-pay | Admitting: Orthopedic Surgery

## 2023-06-05 ENCOUNTER — Ambulatory Visit (INDEPENDENT_AMBULATORY_CARE_PROVIDER_SITE_OTHER): Payer: Medicare Other | Admitting: Orthopedic Surgery

## 2023-06-05 VITALS — BP 122/84 | Ht 62.0 in | Wt 156.0 lb

## 2023-06-05 DIAGNOSIS — M47812 Spondylosis without myelopathy or radiculopathy, cervical region: Secondary | ICD-10-CM | POA: Diagnosis present

## 2023-06-05 DIAGNOSIS — S22040A Wedge compression fracture of fourth thoracic vertebra, initial encounter for closed fracture: Secondary | ICD-10-CM | POA: Diagnosis not present

## 2023-06-05 DIAGNOSIS — Z981 Arthrodesis status: Secondary | ICD-10-CM | POA: Insufficient documentation

## 2023-06-05 DIAGNOSIS — R29898 Other symptoms and signs involving the musculoskeletal system: Secondary | ICD-10-CM

## 2023-06-05 DIAGNOSIS — S22030A Wedge compression fracture of third thoracic vertebra, initial encounter for closed fracture: Secondary | ICD-10-CM | POA: Diagnosis not present

## 2023-06-05 DIAGNOSIS — M4802 Spinal stenosis, cervical region: Secondary | ICD-10-CM | POA: Diagnosis not present

## 2023-06-05 DIAGNOSIS — W19XXXA Unspecified fall, initial encounter: Secondary | ICD-10-CM

## 2023-06-05 NOTE — Telephone Encounter (Signed)
Emg order for upper extremities for Dr. Sherryll Burger. She is a patient of his already.

## 2023-06-05 NOTE — Patient Instructions (Signed)
It was so nice to see you today. Thank you so much for coming in.    I ordered xrays of your neck. You can get these at [facility] (building with the white pillars) off of [address]. The address is [address], [place], [place] [address]. You do not need any appointment.   Once you have the xrays, it can take 10-14 days to get the results back.   I want to get an EMG (nerve conduction test) to look into things further. I have ordered this with Dr. Sherryll Burger and they will call you to schedule. You can all them at [phone].   Please do not hesitate to call if you have any questions or concerns. You can also message me in MyChart.   Once I have the results of the xrays and nerve test, we will call you for a follow up.   Drake Leach PA-C [phone]

## 2023-06-06 NOTE — Telephone Encounter (Signed)
Referral faxed to [facility]. 

## 2023-06-12 ENCOUNTER — Emergency Department: Payer: Medicare Other

## 2023-06-12 ENCOUNTER — Encounter: Payer: Self-pay | Admitting: *Deleted

## 2023-06-12 ENCOUNTER — Other Ambulatory Visit: Payer: Self-pay

## 2023-06-12 ENCOUNTER — Emergency Department
Admission: EM | Admit: 2023-06-12 | Discharge: 2023-06-12 | Discharge status: Against Medical Advice | Payer: Medicare Other | Attending: Emergency Medicine | Admitting: Emergency Medicine

## 2023-06-12 DIAGNOSIS — R140 Abdominal distension (gaseous): Secondary | ICD-10-CM | POA: Diagnosis not present

## 2023-06-12 DIAGNOSIS — R197 Diarrhea, unspecified: Secondary | ICD-10-CM | POA: Insufficient documentation

## 2023-06-12 DIAGNOSIS — Z5321 Procedure and treatment not carried out due to patient leaving prior to being seen by health care provider: Secondary | ICD-10-CM | POA: Insufficient documentation

## 2023-06-12 DIAGNOSIS — M549 Dorsalgia, unspecified: Secondary | ICD-10-CM | POA: Diagnosis not present

## 2023-06-12 DIAGNOSIS — R0602 Shortness of breath: Secondary | ICD-10-CM | POA: Diagnosis not present

## 2023-06-12 DIAGNOSIS — R110 Nausea: Secondary | ICD-10-CM | POA: Insufficient documentation

## 2023-06-12 DIAGNOSIS — M542 Cervicalgia: Secondary | ICD-10-CM | POA: Diagnosis not present

## 2023-06-12 LAB — COMPREHENSIVE METABOLIC PANEL
ALT: 16 U/L (ref 0–44)
AST: 18 U/L (ref 15–41)
Albumin: 4.2 g/dL (ref 3.5–5.0)
Alkaline Phosphatase: 69 U/L (ref 38–126)
Anion gap: 11 (ref 5–15)
BUN: 15 mg/dL (ref 8–23)
CO2: 25 mmol/L (ref 22–32)
Calcium: 8.8 mg/dL — ABNORMAL LOW (ref 8.9–10.3)
Chloride: 101 mmol/L (ref 98–111)
Creatinine, Ser: 0.64 mg/dL (ref 0.44–1.00)
GFR, Estimated: >60 mL/min (ref >60)
Glucose, Bld: 94 mg/dL (ref 70–99)
Potassium: 4 mmol/L (ref 3.5–5.1)
Sodium: 137 mmol/L (ref 135–145)
Total Bilirubin: 0.5 mg/dL (ref 0.3–1.2)
Total Protein: 7.1 g/dL (ref 6.5–8.1)

## 2023-06-12 LAB — CBC
HCT: 38.2 % (ref 36.0–46.0)
Hemoglobin: 12.2 g/dL (ref 12.0–15.0)
MCH: 30.6 pg (ref 26.0–34.0)
MCHC: 31.9 g/dL (ref 30.0–36.0)
MCV: 95.7 fL (ref 80.0–100.0)
Platelets: 259 10*3/uL (ref 150–400)
RBC: 3.99 MIL/uL (ref 3.87–5.11)
RDW: 11.9 % (ref 11.5–15.5)
WBC: 7 10*3/uL (ref 4.0–10.5)
nRBC: 0 % (ref 0.0–0.2)

## 2023-06-12 LAB — LIPASE, BLOOD: Lipase: 22 U/L (ref 11–51)

## 2023-06-12 MED ORDER — ONDANSETRON 4 MG PO TBDP
4.0000 mg | ORAL_TABLET | Freq: Once | ORAL | Status: AC
  Administered 2023-06-12: 4 mg via ORAL

## 2023-06-12 NOTE — ED Notes (Signed)
Pt stating she wants to leave and she can't sit here in this hard chair. Pt informed by this RN to just wait so we can get her checked out. Pt said ok.

## 2023-06-12 NOTE — ED Notes (Addendum)
Pt seen by EMS at desk to throw self on floor and fall. This RN and EMS went to assist pt. Pt laying on floor and saying Im sorry I can't sit in that chair it is hurting my back. Pt did not have any loc. Pt speaking in clear sentences. No obvious trauma noted to pt. Pt assisted and stood up and placed back in wheelchair. Pt instructed to not get up and to sit all the way back so she won't fall. Pt states I can't help it and I can't sit here. Pt moved over next to 1st nurse. New orders placed and all paperwork to be completed. Charge [employer] teacher informed.   Pt denies any pain or injuries.

## 2023-06-12 NOTE — ED Triage Notes (Signed)
Pt was brought over from [place] [facility] where she was seen for nausea and diarrhea  since Thursday night.  Pt was so has neck and back pain since fall a few months ago and she reports that she is having sob and bloating.  Pt appears very uncomfortable in triage.

## 2023-06-12 NOTE — ED Notes (Signed)
Pt states she can't stay any longer. Pt states she has her dogs alone at home and needs to go take care of them. Pt  instructed to come back if something changes.

## 2023-06-14 ENCOUNTER — Ambulatory Visit
Admission: RE | Admit: 2023-06-14 | Discharge: 2023-06-14 | Disposition: A | Discharge status: Home / Self Care | Payer: Medicare Other | Source: Ambulatory Visit | Attending: Gastroenterology | Admitting: Gastroenterology

## 2023-06-14 ENCOUNTER — Other Ambulatory Visit: Payer: Self-pay | Admitting: Gastroenterology

## 2023-06-14 DIAGNOSIS — R197 Diarrhea, unspecified: Secondary | ICD-10-CM | POA: Diagnosis present

## 2023-06-14 DIAGNOSIS — R112 Nausea with vomiting, unspecified: Secondary | ICD-10-CM | POA: Insufficient documentation

## 2023-06-14 MED ORDER — IOHEXOL 300 MG/ML  SOLN
100.0000 mL | Freq: Once | INTRAMUSCULAR | Status: AC | PRN
  Administered 2023-06-14: 100 mL via INTRAVENOUS

## 2023-06-15 ENCOUNTER — Other Ambulatory Visit
Admission: RE | Admit: 2023-06-15 | Discharge: 2023-06-15 | Disposition: A | Discharge status: Home / Self Care | Payer: Medicare Other | Source: Ambulatory Visit | Attending: Gastroenterology | Admitting: Gastroenterology

## 2023-06-15 DIAGNOSIS — R109 Unspecified abdominal pain: Secondary | ICD-10-CM | POA: Insufficient documentation

## 2023-06-15 DIAGNOSIS — R197 Diarrhea, unspecified: Secondary | ICD-10-CM | POA: Diagnosis not present

## 2023-06-15 DIAGNOSIS — R112 Nausea with vomiting, unspecified: Secondary | ICD-10-CM | POA: Diagnosis not present

## 2023-06-15 LAB — GASTROINTESTINAL PANEL BY PCR, STOOL (REPLACES STOOL CULTURE)

## 2023-06-16 ENCOUNTER — Telehealth: Payer: Self-pay | Admitting: Orthopedic Surgery

## 2023-06-16 NOTE — Telephone Encounter (Signed)
I called [facility], they stated they did not receive the referral. They will contact patient to schedule EMG.

## 2023-06-16 NOTE — Telephone Encounter (Signed)
Patient saw Kennyth Arnold on 06/05/2023 and the patient states that she was asked if she was having any diarrhea. She states that she began having diarrhea and distension in her stomach on 06/08/2023 and it was consistent, but now it is more sporadic. She also states that she has been having extreme nausea and has been taking Zofran to help with the nausea. She states that she did not need a call back from our office; she just wanted this to be documented.

## 2023-06-16 NOTE — Telephone Encounter (Signed)
She is being seen by GI for her diarrhea. She should continue to follow with them.

## 2023-06-26 NOTE — Telephone Encounter (Signed)
07/11/2023 appt

## 2023-06-27 ENCOUNTER — Encounter: Payer: Self-pay | Admitting: Orthopedic Surgery

## 2023-06-27 NOTE — Progress Notes (Signed)
Cervical xrays dated 06/05/23:  FINDINGS: Anterior fusion hardware unchanged at the C4-5 level as the inferior left-sided screw is missing and unchanged. 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 Minimal disc space narrowing at the C3-4 level unchanged. Mild spondylosis of the cervical spine to include uncovertebral joint spurring and facet arthropathy. No evidence of instability on flexion extension. Remainder of the exam is unchanged.   IMPRESSION: 1. No acute findings. 2. 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 3. Mild spondylosis of the cervical spine.     Electronically Signed   By: Elberta Fortis M.D.   On: 06/27/2023 13:52  I have personally reviewed the images and agree with the above interpretation.   Scheduled for EMG on 07/11/23 at [facility]. Once I have these results back, will review everything with Dr. Katrinka Blazing.

## 2023-06-30 ENCOUNTER — Telehealth: Payer: Self-pay | Admitting: Orthopedic Surgery

## 2023-06-30 NOTE — Telephone Encounter (Signed)
Patient is calling to let our office know that she is scheduled for her nerve study on 07/11/2023. She would like to know if this comes back normal, would she be able to have surgery on her neck to alleviate this pain?

## 2023-06-30 NOTE — Telephone Encounter (Signed)
I spoke with [name] and relayed Sabrina Stanley's response.

## 2023-06-30 NOTE — Telephone Encounter (Signed)
I will have to review the results of the nerve test and her imaging with Dr. Katrinka Blazing to see if she is a candidate for surgery.   I can't answer this yet as we don't have all the information.

## 2023-07-18 NOTE — Telephone Encounter (Signed)
07/31/2023 

## 2023-08-14 NOTE — Telephone Encounter (Signed)
No results yet

## 2023-08-21 NOTE — Telephone Encounter (Signed)
EMG results in care everywhere.

## 2023-08-25 ENCOUNTER — Telehealth: Payer: Self-pay | Admitting: Orthopedic Surgery

## 2023-08-25 NOTE — Telephone Encounter (Signed)
I called patient to let her know that I have forwarded her message for Kennyth Arnold to see when she gets back on Monday. Patient asked if I could share results of her recent scans. I told patient that if she had not received these results then I could not provide initial result as it is outside of my scope of practice.

## 2023-08-25 NOTE — Telephone Encounter (Signed)
Patient has called stating she has not heard anything back. Wanting to know what her next step is, please advise

## 2023-08-27 NOTE — Telephone Encounter (Signed)
Please let her know nerve test results were normal. Recommend she follow up with Dr. Katrinka Blazing to discuss further results. Would have her see him now and then he can order any further imaging that is needed.

## 2023-08-27 NOTE — Telephone Encounter (Signed)
Addressed in another MyChart message.

## 2023-08-28 NOTE — Telephone Encounter (Signed)
Patient notified and appointment has been made.  

## 2023-08-30 DIAGNOSIS — M4802 Spinal stenosis, cervical region: Secondary | ICD-10-CM

## 2023-08-30 HISTORY — DX: Spinal stenosis, cervical region: M48.02

## 2023-09-01 NOTE — Progress Notes (Signed)
 Referring Physician:  Auston Reyes BIRCH, MD [address] [facility] [place],  [place] [address]  Primary Physician:  Auston Reyes BIRCH, MD  History of Present Illness: 09/06/2023 [name] is here today with a chief complaint of right hand pain and neck pain.  She has been seen by our team previously.  She states that her biggest complaint is in her right hand.  She notices that her joints have become more swollen and feels like her fingers are changing in shape.  She feels severe pain in her mid palm but she feels like this does not remind her of nerve pain or tingling or any electrical sensations.  She feels like it stiff.  She has been developing a significant amount of skin breakdown in her hands bilaterally and has been using superglue to close the wounds.  In regards to her neck pain, she has had a previous anterior cervical discectomy and fusion and has had progressive chronic neck pain.  She feels worse when she is upright and improves when she is recumbent or supported.  She feels like her neck is constantly tight and cramping.  She continues to see pain management.  She has had bilateral ulnar nerve decompressions.  Review of Systems:  A 10 point review of systems is negative, except for the pertinent positives and negatives detailed in the HPI.  Past Medical History: Past Medical History:  Diagnosis Date   Abnormal mammogram, unspecified    Alcoholism (HCC)    Allergic state    Anemia    Anxiety    Arthritis    Bilateral breast cysts    Bipolar 1 disorder (HCC)    Bipolar 1 disorder (HCC)    Bone marrow disorder    Bronchitis    Cervical stenosis of spine    Cervicalgia    Chronic diarrhea    Chronic fatigue disorder    Chronic pain    COPD (chronic obstructive pulmonary disease) (HCC)    COVID-19 05/2020   Depression    Depression    Eczema    Esophageal reflux    Fibromyalgia    GERD (gastroesophageal reflux disease)    Headache     Hiatal hernia    History of esophagogastroduodenoscopy (EGD)    Hyperlipidemia    Hyperlipidemia    Hypertension    Hypothyroidism    Insomnia    Lithium toxicity    Menstrual irregularity    Myalgia    Neuropathy    lower legs   Osteopenia    Osteopenia    Panic disorder    Pleurisy    Pneumonia    Psoriasis    PVD (peripheral vascular disease) (HCC)    Sleep apnea    No CPAP   Tobacco abuse    UTI (urinary tract infection)    Vitamin D  deficiency    Wears dentures    partial lower    Past Surgical History: Past Surgical History:  Procedure Laterality Date   ABDOMINAL HYSTERECTOMY  1996   BACK SURGERY     BREAST BIOPSY Right 08/21/2012   FIBROADENOMATOUS CHANGE WITH CALCIFICATIONS.    BREAST BIOPSY Right    FIBROUS SCARRING, negative for atypia   CATARACT EXTRACTION W/PHACO Right 11/16/2020   Procedure: CATARACT EXTRACTION PHACO AND INTRAOCULAR LENS PLACEMENT (IOC) RIGHT;  Surgeon: Myrna Adine Anes, MD;  Location: [facility];  Service: Ophthalmology;  Laterality: Right;  0.77 0:12.9   CATARACT EXTRACTION W/PHACO Left 12/14/2020  Procedure: CATARACT EXTRACTION PHACO AND INTRAOCULAR LENS PLACEMENT (IOC) LEFT;  Surgeon: Myrna Adine Anes, MD;  Location: [facility];  Service: Ophthalmology;  Laterality: Left;  1.25 0:12.0   COLONOSCOPY     COLONOSCOPY N/A 04/27/2022   Procedure: COLONOSCOPY;  Surgeon: Toledo, Ladell POUR, MD;  Location: [facility];  Service: Gastroenterology;  Laterality: N/A;   DILATION AND CURETTAGE OF UTERUS  1979, 1980   ESOPHAGOGASTRODUODENOSCOPY     ESOPHAGOGASTRODUODENOSCOPY N/A 09/17/2015   Procedure: ESOPHAGOGASTRODUODENOSCOPY (EGD);  Surgeon: Deward CINDERELLA Piedmont, MD;  Location: [facility];  Service: Gastroenterology;  Laterality: N/A;   ESOPHAGOGASTRODUODENOSCOPY N/A 04/27/2022   Procedure: ESOPHAGOGASTRODUODENOSCOPY (EGD);  Surgeon: Toledo, Ladell POUR, MD;  Location: [facility];  Service: Gastroenterology;  Laterality:  N/A;   EYE SURGERY  2010   KNEE SURGERY Right 1998   meniscus repair   NASAL SINUS SURGERY  1998, 2000   NECK SURGERY     x 2   OOPHORECTOMY     SPINE SURGERY  2006, 2008   cervical fusion   TONSILLECTOMY      Allergies: Allergies as of 09/06/2023 - Review Complete 09/06/2023  Allergen Reaction Noted   Asa [aspirin]  12/27/2012   Codeine     Cyclobenzaprine Other (See Comments) 01/06/2014   Darvocet [propoxyphene n-acetaminophen ]  12/27/2012   Effexor [venlafaxine hcl] Other (See Comments) 12/27/2012   Erythromycin Other (See Comments) 01/06/2014   Meperidine-promethazine     Neurontin [gabapentin]  12/27/2012   Nsaids  09/02/2013   Propoxyphene     Mobic [meloxicam] Palpitations 12/27/2012   Penicillins Rash 12/27/2012    Medications:  Current Outpatient Medications:    albuterol  (VENTOLIN  HFA) 108 (90 Base) MCG/ACT inhaler, Inhale 2 puffs into the lungs every 6 (six) hours as needed., Disp: , Rfl:    amphetamine-dextroamphetamine (ADDERALL XR) 30 MG 24 hr capsule, Take 30 mg by mouth daily., Disp: , Rfl:    atorvastatin  (LIPITOR) 10 MG tablet, , Disp: , Rfl:    azithromycin (ZITHROMAX) 500 MG tablet, Take 500 mg by mouth daily., Disp: , Rfl:    Biotin  5 MG CAPS, Take 1,000 mg by mouth daily., Disp: , Rfl:    bisoprolol -hydrochlorothiazide  (ZIAC ) 2.5-6.25 MG tablet, Take 1 tablet by mouth daily., Disp: , Rfl:    buPROPion  (WELLBUTRIN ) 100 MG tablet, Take 100 mg by mouth daily., Disp: , Rfl:    Cholecalciferol  125 MCG (5000 UT) TABS, Take by mouth., Disp: , Rfl:    clobetasol  cream (TEMOVATE ) 0.05 %, Apply twice daily to hands up to 2 weeks as needed, Disp: 45 g, Rfl: 2   cyanocobalamin  (,VITAMIN B-12,) 1000 MCG/ML injection, Inject into the muscle., Disp: , Rfl:    diclofenac sodium (VOLTAREN) 1 % GEL, Apply topically., Disp: , Rfl:    divalproex  (DEPAKOTE  ER) 500 MG 24 hr tablet, , Disp: , Rfl:    esomeprazole (NEXIUM) 40 MG capsule, Take 40 mg by mouth 2 (two) times  daily., Disp: , Rfl:    famotidine  (PEPCID ) 40 MG tablet, Take 40 mg by mouth daily., Disp: , Rfl:    FLUoxetine  (PROZAC ) 20 MG capsule, Take 60 mg by mouth daily. , Disp: , Rfl:    Fluticasone -Salmeterol (ADVAIR) 100-50 MCG/DOSE AEPB, Inhale 1 puff into the lungs daily as needed., Disp: , Rfl:    furosemide  (LASIX ) 20 MG tablet, Take 20 mg by mouth as needed for edema., Disp: , Rfl:    Meth-Hyo-M Bl-Na Phos-Ph Sal (URIBEL) 118 MG CAPS, Take by mouth., Disp: ,  Rfl:    methocarbamol  (ROBAXIN ) 750 MG tablet, Take 750 mg by mouth 3 (three) times daily., Disp: , Rfl:    montelukast  (SINGULAIR ) 10 MG tablet, Take 10 mg by mouth daily., Disp: , Rfl:    morphine  (MS CONTIN ) 15 MG 12 hr tablet, Limit 2-3 tabs by mouth per day if tolerated (Patient taking differently: Take 15 mg by mouth. Limit 2-3 tabs by mouth per day if tolerated), Disp: 90 tablet, Rfl: 0   ondansetron  (ZOFRAN -ODT) 4 MG disintegrating tablet, , Disp: , Rfl:    oxyCODONE -acetaminophen  (PERCOCET) 10-325 MG tablet, Take 1 tablet by mouth every 4 (four) hours as needed., Disp: , Rfl:    potassium chloride  SA (K-DUR,KLOR-CON ) 20 MEQ tablet, Take 20 mEq by mouth daily., Disp: , Rfl:    predniSONE  (DELTASONE ) 10 MG tablet, Take 10 mg by mouth as directed., Disp: , Rfl:    primidone  (MYSOLINE ) 50 MG tablet, Take 50 mg by mouth 2 (two) times daily., Disp: , Rfl:    rOPINIRole  (REQUIP ) 1 MG tablet, Take 1 mg by mouth at bedtime. , Disp: , Rfl:    SUMAtriptan  (IMITREX ) 100 MG tablet, Take 100 mg by mouth every 2 (two) hours as needed for migraine., Disp: , Rfl:    topiramate  (TOPAMAX ) 200 MG tablet, Take 200 mg by mouth daily., Disp: , Rfl:    triamcinolone  cream (KENALOG ) 0.1 %, Apply 1 application topically See admin instructions. Apply nightly to legs Monday - Friday only. Avoid applying to face, groin, and axilla. Use as directed., Disp: 453.6 g, Rfl: 2   Vitamin D , Ergocalciferol , (DRISDOL ) 50000 units CAPS capsule, , Disp: , Rfl:     modafinil (PROVIGIL) 200 MG tablet, Take 200 mg by mouth 2 (two) times daily. (Patient not taking: Reported on 09/06/2023), Disp: , Rfl:    naloxone  (NARCAN ) 4 MG/0.1ML LIQD nasal spray kit, Place into the nose. (Patient not taking: Reported on 09/06/2023), Disp: , Rfl:   Current Facility-Administered Medications:    lidocaine  (PF) (XYLOCAINE ) 1 % injection 10 mL, 10 mL, Subcutaneous, Once, Dannial Hacker, MD   midazolam  (VERSED ) 5 MG/5ML injection 5 mg, 5 mg, Intravenous, Once, Dannial Hacker, MD   orphenadrine  (NORFLEX ) injection 60 mg, 60 mg, Intramuscular, Once, Dannial Hacker, MD  Social History: Social History   Tobacco Use   Smoking status: Every Day    Current packs/day: 1.25    Average packs/day: 1.3 packs/day for 47.0 years (58.8 ttl pk-yrs)    Types: Cigarettes    Passive exposure: Current   Smokeless tobacco: Never   Tobacco comments:    started age 49  Vaping Use   Vaping status: Former  Substance Use Topics   Alcohol use: No    Alcohol/week: 0.0 standard drinks of alcohol   Drug use: No    Family Medical History: Family History  Problem Relation Age of Onset   Depression Mother    Hypertension Mother    Lung cancer Father    Cancer Father    Lung cancer Paternal Uncle    Cervical cancer Maternal Grandmother    Breast cancer Other     Physical Examination: Vitals:   09/06/23 1524  BP: 110/70    General: Patient is in no apparent distress. Attention to examination is appropriate.  Neck:   Supple.  Full range of motion.  Respiratory: Patient is breathing without any difficulty.   NEUROLOGICAL:     Awake, alert, oriented to person, place, and time.  Speech is clear and fluent.  Cranial Nerves: Pupils equal round and reactive to light.  Facial tone is symmetric.  Facial sensation is symmetric. Shoulder shrug is symmetric. Tongue protrusion is midline.    Strength: Side Biceps Triceps Deltoid Interossei Grip Wrist Ext. Wrist Flex.  R 4- 4- 3 4- 4 3 3    L 4+ 4+ 4+ 4+ 4+ 4- 4-    Side Iliopsoas Quads Hamstring PF DF EHL  R 5 5 5 5 5 5   L 5 5 5 5 5 5      Reflexes are 3+and symmetric at the biceps, triceps, brachioradialis, patella and achilles.   Hoffman's is absent. Clonus is absent  Bilateral upper and lower extremity sensation is intact to light touch      No evidence of dysmetria noted.   Imaging: Narrative & Impression  CLINICAL DATA:  Fall 3 weeks ago, neck pain radiating to the left shoulder. History of fusion.   EXAM: MRI CERVICAL SPINE WITHOUT CONTRAST   TECHNIQUE: Multiplanar, multisequence MR imaging of the cervical spine was performed. No intravenous contrast was administered.   COMPARISON:  Cervical spine CT 05/28/2015, cervical spine MRI 11/07/2012   FINDINGS: Alignment: There is trace retrolisthesis of C3 on C4 and trace anterolisthesis of C7 on T1, not significantly changed.   Vertebrae: There is mild compression deformity of the superior T3 and T4 endplates with mild marrow edema consistent with subacute fractures, new since 11/25/2022 and not significantly changed since 05/09/2023. There is no bony retropulsion at either level.   Cervical vertebral body heights are preserved, without other evidence of acute fracture. The patient is status post fusion from C4 through C7 with ACDF hardware remaining at C4-C5. There is no suspicious marrow signal abnormality or other marrow edema.   Cord: Normal in signal and morphology.   Posterior Fossa, vertebral arteries, paraspinal tissues: The imaged posterior fossa is unremarkable. The vertebral artery flow voids are normal. The paraspinal soft tissues are unremarkable.   Disc levels:   Evaluation of the neural foramina is degraded by motion artifact. Within this confine:   C2-C3: There is mild uncovertebral ridging and moderate left worse than right facet arthropathy resulting in probable moderate left worse than right neural foraminal stenosis without  significant spinal canal stenosis which appears progressed since 2014.   C3-C4: There is trace retrolisthesis with a broad-based posterior disc osteophyte complex, prominent bilateral uncovertebral ridging, and mild bilateral facet arthropathy resulting in mild spinal canal stenosis and probable severe right worse than left neural foraminal stenosis, likely progressed since 2014.   C4-C5: Status post ACDF without residual spinal canal or neural foraminal stenosis   C5-C6: Status post fusion without residual spinal canal or neural foraminal stenosis.   C6-C7: Status post fusion without significant spinal canal or neural foraminal stenosis.   C7-T1: There is trace anterolisthesis with left worse than right facet arthropathy and mild endplate spurring resulting in moderate to severe left and mild right neural foraminal stenosis without significant spinal canal stenosis, progressed since 2014.   IMPRESSION: 1. Subacute compression fractures of the superior T3 and T4 endplates with mild marrow edema and no retropulsion, not significantly changed since 05/09/2023. 2. Status post fusion from C4 through C7 without residual spinal canal or neural foraminal stenosis at the surgical levels. 3. 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 4. Moderate left worse than right neural foraminal stenosis at C2-C3, also likely progressed since 2014.  5. Moderate to severe left and mild right neural foraminal stenosis at C7-T1, progressed.     Electronically Signed   By: Maude Harry M.D.   On: 05/25/2023 16:31     Narrative & Impression  CLINICAL DATA:  Post cervical fusion 10 years ago. Fall 1 month ago with persistent posterior neck pain as well as bilateral arm weakness.   EXAM: CERVICAL SPINE - COMPLETE 4+ VIEW   COMPARISON:  05/05/2023    FINDINGS: Anterior fusion hardware unchanged at the C4-5 level as the inferior left-sided screw is missing and unchanged. 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 Minimal disc space narrowing at the C3-4 level unchanged. Mild spondylosis of the cervical spine to include uncovertebral joint spurring and facet arthropathy. No evidence of instability on flexion extension. Remainder of the exam is unchanged.   IMPRESSION: 1. No acute findings. 2. 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 3. Mild spondylosis of the cervical spine.     Electronically Signed   By: Toribio Agreste M.D.   On: 06/27/2023 13:52     I have personally reviewed the images and agree with the above interpretation.  Medical Decision Making/Assessment and Plan: [name] is a pleasant 63 y.o. female with 2 major complaints for today's visit.  The first being neck pain and the second being right hand pain.  She states her right hand is most bothersome currently as she has been dealing with neck pain for a significant amount of time.  She feels that she has pain right in her mid palm that worsens with any activity.  She also has noticed swelling and changes in the shapes of her fingers.  She notes that she cannot get her rings on anymore.  She has also noticed that she has had increased amount of skin breakdown in her hands and has tried various creams but currently uses significant amount of superglue to close these wounds.  She had an EMG recently which did not demonstrate any nerve impingement in her bilateral upper extremities.  Also did not demonstrate any radiculopathy.  She does have previous neck issues and has some foraminal stenosis noted, however this is worse on the left at the C7-T1 junction, upon the right side there is some at the C3-4 which would not correlate to her right hand.  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  We did discuss that cervical deformity corrections are major surgeries and she would need a major fusion and referral to a tertiary center for this type of procedure.  She states that she would not want to have any major cervical operations and is refusing the consult a lesion at this time.  I let her know she could reach out in the future if she decides she wants to see someone.  Regards to her hands, I do not think this is a neuropathic issue, she does have longstanding spinal cord issues, however this does not appear to be an acute nerve issue.  Some concern with her family history of rheumatoid arthritis that this may be a presentation of an onset of an arthritis, will defer to her primary team for further evaluation or referrals.  She can continue  to follow-up as needed  Thank you for involving me in the care of this patient.   Spent a total of 30 minutes with the patient evaluating her chart, face-to-face evaluation, discussion of her findings and current symptomatology, and coordination of her care.  Penne MICAEL Sharps MD/MSCR Neurosurgery

## 2023-09-06 ENCOUNTER — Encounter: Payer: Self-pay | Admitting: Neurosurgery

## 2023-09-06 ENCOUNTER — Ambulatory Visit: Payer: Medicare Other | Admitting: Neurosurgery

## 2023-09-06 VITALS — BP 110/70 | Wt 151.2 lb

## 2023-09-06 DIAGNOSIS — R29898 Other symptoms and signs involving the musculoskeletal system: Secondary | ICD-10-CM

## 2023-09-06 DIAGNOSIS — M47812 Spondylosis without myelopathy or radiculopathy, cervical region: Secondary | ICD-10-CM | POA: Diagnosis not present

## 2023-09-06 DIAGNOSIS — Z981 Arthrodesis status: Secondary | ICD-10-CM

## 2023-09-06 DIAGNOSIS — M79641 Pain in right hand: Secondary | ICD-10-CM

## 2023-10-04 ENCOUNTER — Ambulatory Visit (INDEPENDENT_AMBULATORY_CARE_PROVIDER_SITE_OTHER): Payer: Medicare Other | Admitting: Neurosurgery

## 2023-10-04 VITALS — BP 118/78 | Ht 62.0 in | Wt 151.0 lb

## 2023-10-04 DIAGNOSIS — G8929 Other chronic pain: Secondary | ICD-10-CM | POA: Diagnosis not present

## 2023-10-04 DIAGNOSIS — R29898 Other symptoms and signs involving the musculoskeletal system: Secondary | ICD-10-CM | POA: Insufficient documentation

## 2023-10-04 DIAGNOSIS — G894 Chronic pain syndrome: Secondary | ICD-10-CM

## 2023-10-04 DIAGNOSIS — Z981 Arthrodesis status: Secondary | ICD-10-CM | POA: Diagnosis not present

## 2023-10-04 DIAGNOSIS — M47812 Spondylosis without myelopathy or radiculopathy, cervical region: Secondary | ICD-10-CM | POA: Insufficient documentation

## 2023-10-04 NOTE — Progress Notes (Signed)
 Referring Physician:  No referring provider defined for this encounter.  Primary Physician:  Auston Reyes BIRCH, MD  History of Present Illness: 10/04/2023  Sabrina Stanley is here today for follow-up.  When I last saw her she had a history of chronic neck and hand pain as well as bilateral arm pain.  She is been following with our team intermittently.  She has had multiple previous cervical spine surgeries.  She continues to have pain in her bilateral upper extremities left worse than right.  She has had recent workup including an EMG which was negative for any radiculopathy.  She continues to have chronic neck pain which is significant.  She has been managed by pain management.  She has not had any previous spinal cord stimulator evaluation.   Review of Systems:  A 10 point review of systems is negative, except for the pertinent positives and negatives detailed in the HPI.  Past Medical History: Past Medical History:  Diagnosis Date   Abnormal mammogram, unspecified    Alcoholism (HCC)    Allergic state    Anemia    Anxiety    Arthritis    Bilateral breast cysts    Bipolar 1 disorder (HCC)    Bipolar 1 disorder (HCC)    Bone marrow disorder    Bronchitis    Cervical stenosis of spine    Cervicalgia    Chronic diarrhea    Chronic fatigue disorder    Chronic pain    COPD (chronic obstructive pulmonary disease) (HCC)    COVID-19 05/2020   Depression    Depression    Eczema    Esophageal reflux    Fibromyalgia    GERD (gastroesophageal reflux disease)    Headache    Hiatal hernia    History of esophagogastroduodenoscopy (EGD)    Hyperlipidemia    Hyperlipidemia    Hypertension    Hypothyroidism    Insomnia    Lithium toxicity    Menstrual irregularity    Myalgia    Neuropathy    lower legs   Osteopenia    Osteopenia    Panic disorder    Pleurisy    Pneumonia    Psoriasis    PVD (peripheral vascular disease) (HCC)    Sleep apnea    No CPAP   Tobacco abuse     UTI (urinary tract infection)    Vitamin D  deficiency    Wears dentures    partial lower    Past Surgical History: Past Surgical History:  Procedure Laterality Date   ABDOMINAL HYSTERECTOMY  1996   BACK SURGERY     BREAST BIOPSY Right 08/21/2012   FIBROADENOMATOUS CHANGE WITH CALCIFICATIONS.    BREAST BIOPSY Right    FIBROUS SCARRING, negative for atypia   CATARACT EXTRACTION W/PHACO Right 11/16/2020   Procedure: CATARACT EXTRACTION PHACO AND INTRAOCULAR LENS PLACEMENT (IOC) RIGHT;  Surgeon: Myrna Adine Anes, MD;  Location: [facility];  Service: Ophthalmology;  Laterality: Right;  0.77 0:12.9   CATARACT EXTRACTION W/PHACO Left 12/14/2020   Procedure: CATARACT EXTRACTION PHACO AND INTRAOCULAR LENS PLACEMENT (IOC) LEFT;  Surgeon: Myrna Adine Anes, MD;  Location: [facility];  Service: Ophthalmology;  Laterality: Left;  1.25 0:12.0   COLONOSCOPY     COLONOSCOPY N/A 04/27/2022   Procedure: COLONOSCOPY;  Surgeon: Toledo, Ladell POUR, MD;  Location: [facility];  Service: Gastroenterology;  Laterality: N/A;   DILATION AND CURETTAGE OF UTERUS  1979, 1980   ESOPHAGOGASTRODUODENOSCOPY     ESOPHAGOGASTRODUODENOSCOPY  N/A 09/17/2015   Procedure: ESOPHAGOGASTRODUODENOSCOPY (EGD);  Surgeon: Deward CINDERELLA Piedmont, MD;  Location: [facility];  Service: Gastroenterology;  Laterality: N/A;   ESOPHAGOGASTRODUODENOSCOPY N/A 04/27/2022   Procedure: ESOPHAGOGASTRODUODENOSCOPY (EGD);  Surgeon: Toledo, Ladell POUR, MD;  Location: [facility];  Service: Gastroenterology;  Laterality: N/A;   EYE SURGERY  2010   KNEE SURGERY Right 1998   meniscus repair   NASAL SINUS SURGERY  1998, 2000   NECK SURGERY     x 2   OOPHORECTOMY     SPINE SURGERY  2006, 2008   cervical fusion   TONSILLECTOMY      Allergies: Allergies as of 10/04/2023 - Review Complete 10/04/2023  Allergen Reaction Noted   Asa [aspirin]  12/27/2012   Codeine     Cyclobenzaprine Other (See Comments) 01/06/2014   Darvocet  [propoxyphene n-acetaminophen ]  12/27/2012   Effexor [venlafaxine hcl] Other (See Comments) 12/27/2012   Erythromycin Other (See Comments) 01/06/2014   Meperidine-promethazine     Neurontin [gabapentin]  12/27/2012   Nsaids  09/02/2013   Propoxyphene     Mobic [meloxicam] Palpitations 12/27/2012   Penicillins Rash 12/27/2012    Medications:  Current Outpatient Medications:    albuterol  (VENTOLIN  HFA) 108 (90 Base) MCG/ACT inhaler, Inhale 2 puffs into the lungs every 6 (six) hours as needed., Disp: , Rfl:    amphetamine-dextroamphetamine (ADDERALL XR) 30 MG 24 hr capsule, Take 30 mg by mouth daily., Disp: , Rfl:    atorvastatin  (LIPITOR) 10 MG tablet, , Disp: , Rfl:    Biotin  5 MG CAPS, Take 1,000 mg by mouth daily., Disp: , Rfl:    bisoprolol -hydrochlorothiazide  (ZIAC ) 2.5-6.25 MG tablet, Take 1 tablet by mouth daily., Disp: , Rfl:    buPROPion  (WELLBUTRIN ) 100 MG tablet, Take 100 mg by mouth daily., Disp: , Rfl:    Cholecalciferol  125 MCG (5000 UT) TABS, Take by mouth., Disp: , Rfl:    clobetasol  cream (TEMOVATE ) 0.05 %, Apply twice daily to hands up to 2 weeks as needed, Disp: 45 g, Rfl: 2   cyanocobalamin  (,VITAMIN B-12,) 1000 MCG/ML injection, Inject into the muscle., Disp: , Rfl:    diclofenac sodium (VOLTAREN) 1 % GEL, Apply topically., Disp: , Rfl:    divalproex  (DEPAKOTE  ER) 500 MG 24 hr tablet, , Disp: , Rfl:    esomeprazole (NEXIUM) 40 MG capsule, Take 40 mg by mouth 2 (two) times daily., Disp: , Rfl:    famotidine  (PEPCID ) 40 MG tablet, Take 40 mg by mouth daily., Disp: , Rfl:    FLUoxetine  (PROZAC ) 20 MG capsule, Take 60 mg by mouth daily. , Disp: , Rfl:    Fluticasone -Salmeterol (ADVAIR) 100-50 MCG/DOSE AEPB, Inhale 1 puff into the lungs daily as needed., Disp: , Rfl:    furosemide  (LASIX ) 20 MG tablet, Take 20 mg by mouth as needed for edema., Disp: , Rfl:    Meth-Hyo-M Bl-Na Phos-Ph Sal (URIBEL) 118 MG CAPS, Take by mouth., Disp: , Rfl:    methocarbamol  (ROBAXIN ) 750 MG  tablet, Take 750 mg by mouth 3 (three) times daily., Disp: , Rfl:    modafinil (PROVIGIL) 200 MG tablet, Take 200 mg by mouth 2 (two) times daily. (Patient not taking: Reported on 09/06/2023), Disp: , Rfl:    montelukast  (SINGULAIR ) 10 MG tablet, Take 10 mg by mouth daily., Disp: , Rfl:    morphine  (MS CONTIN ) 15 MG 12 hr tablet, Limit 2-3 tabs by mouth per day if tolerated (Patient taking differently: Take 15 mg by mouth. Limit 2-3 tabs  by mouth per day if tolerated), Disp: 90 tablet, Rfl: 0   naloxone  (NARCAN ) 4 MG/0.1ML LIQD nasal spray kit, Place into the nose. (Patient not taking: Reported on 09/06/2023), Disp: , Rfl:    ondansetron  (ZOFRAN -ODT) 4 MG disintegrating tablet, , Disp: , Rfl:    oxyCODONE -acetaminophen  (PERCOCET) 10-325 MG tablet, Take 1 tablet by mouth every 4 (four) hours as needed., Disp: , Rfl:    potassium chloride  SA (K-DUR,KLOR-CON ) 20 MEQ tablet, Take 20 mEq by mouth daily., Disp: , Rfl:    predniSONE  (DELTASONE ) 10 MG tablet, Take 10 mg by mouth as directed., Disp: , Rfl:    primidone  (MYSOLINE ) 50 MG tablet, Take 50 mg by mouth 2 (two) times daily., Disp: , Rfl:    rOPINIRole  (REQUIP ) 1 MG tablet, Take 1 mg by mouth at bedtime. , Disp: , Rfl:    SUMAtriptan  (IMITREX ) 100 MG tablet, Take 100 mg by mouth every 2 (two) hours as needed for migraine., Disp: , Rfl:    topiramate  (TOPAMAX ) 200 MG tablet, Take 200 mg by mouth daily., Disp: , Rfl:    triamcinolone  cream (KENALOG ) 0.1 %, Apply 1 application topically See admin instructions. Apply nightly to legs Monday - Friday only. Avoid applying to face, groin, and axilla. Use as directed., Disp: 453.6 g, Rfl: 2   Vitamin D , Ergocalciferol , (DRISDOL ) 50000 units CAPS capsule, , Disp: , Rfl:   Current Facility-Administered Medications:    lidocaine  (PF) (XYLOCAINE ) 1 % injection 10 mL, 10 mL, Subcutaneous, Once, Dannial Hacker, MD   midazolam  (VERSED ) 5 MG/5ML injection 5 mg, 5 mg, Intravenous, Once, Dannial Hacker, MD    orphenadrine  (NORFLEX ) injection 60 mg, 60 mg, Intramuscular, Once, Dannial Hacker, MD  Social History: Social History   Tobacco Use   Smoking status: Every Day    Current packs/day: 1.25    Average packs/day: 1.3 packs/day for 47.0 years (58.8 ttl pk-yrs)    Types: Cigarettes    Passive exposure: Current   Smokeless tobacco: Never   Tobacco comments:    started age 73  Vaping Use   Vaping status: Former  Substance Use Topics   Alcohol use: No    Alcohol/week: 0.0 standard drinks of alcohol   Drug use: No    Family Medical History: Family History  Problem Relation Age of Onset   Depression Mother    Hypertension Mother    Lung cancer Father    Cancer Father    Lung cancer Paternal Uncle    Cervical cancer Maternal Grandmother    Breast cancer Other     Physical Examination: Vitals:   10/04/23 1349  BP: 118/78    General: Patient is in no apparent distress. Attention to examination is appropriate.  Neck:   Supple.  Full range of motion.  Respiratory: Patient is breathing without any difficulty.   NEUROLOGICAL:     Awake, alert, oriented to person, place, and time.  Speech is clear and fluent.   Cranial Nerves: Pupils equal round and reactive to light.  Facial tone is symmetric.  Facial sensation is symmetric. Shoulder shrug is symmetric. Tongue protrusion is midline.    Strength: Side Biceps Triceps Deltoid Interossei Grip Wrist Ext. Wrist Flex.  R 4- 4- 3 4- 4 3 3   L 4+ 4+ 4+ 4+ 4+ 4- 4-    Side Iliopsoas Quads Hamstring PF DF EHL  R 5 5 5 5 5 5   L 5 5 5 5 5 5      Reflexes are 3+and symmetric  at the biceps, triceps, brachioradialis, patella and achilles.   Hoffman's is absent. Clonus is absent  Bilateral upper and lower extremity sensation is intact to light touch      No evidence of dysmetria noted.   Imaging: Narrative & Impression  CLINICAL DATA:  Fall 3 weeks ago, neck pain radiating to the left shoulder. History of fusion.   EXAM: MRI  CERVICAL SPINE WITHOUT CONTRAST   TECHNIQUE: Multiplanar, multisequence MR imaging of the cervical spine was performed. No intravenous contrast was administered.   COMPARISON:  Cervical spine CT 05/28/2015, cervical spine MRI 11/07/2012   FINDINGS: Alignment: There is trace retrolisthesis of C3 on C4 and trace anterolisthesis of C7 on T1, not significantly changed.   Vertebrae: There is mild compression deformity of the superior T3 and T4 endplates with mild marrow edema consistent with subacute fractures, new since 11/25/2022 and not significantly changed since 05/09/2023. There is no bony retropulsion at either level.   Cervical vertebral body heights are preserved, without other evidence of acute fracture. The patient is status post fusion from C4 through C7 with ACDF hardware remaining at C4-C5. There is no suspicious marrow signal abnormality or other marrow edema.   Cord: Normal in signal and morphology.   Posterior Fossa, vertebral arteries, paraspinal tissues: The imaged posterior fossa is unremarkable. The vertebral artery flow voids are normal. The paraspinal soft tissues are unremarkable.   Disc levels:   Evaluation of the neural foramina is degraded by motion artifact. Within this confine:   C2-C3: There is mild uncovertebral ridging and moderate left worse than right facet arthropathy resulting in probable moderate left worse than right neural foraminal stenosis without significant spinal canal stenosis which appears progressed since 2014.   C3-C4: There is trace retrolisthesis with a broad-based posterior disc osteophyte complex, prominent bilateral uncovertebral ridging, and mild bilateral facet arthropathy resulting in mild spinal canal stenosis and probable severe right worse than left neural foraminal stenosis, likely progressed since 2014.   C4-C5: Status post ACDF without residual spinal canal or neural foraminal stenosis   C5-C6: Status post fusion  without residual spinal canal or neural foraminal stenosis.   C6-C7: Status post fusion without significant spinal canal or neural foraminal stenosis.   C7-T1: There is trace anterolisthesis with left worse than right facet arthropathy and mild endplate spurring resulting in moderate to severe left and mild right neural foraminal stenosis without significant spinal canal stenosis, progressed since 2014.   IMPRESSION: 1. Subacute compression fractures of the superior T3 and T4 endplates with mild marrow edema and no retropulsion, not significantly changed since 05/09/2023. 2. Status post fusion from C4 through C7 without residual spinal canal or neural foraminal stenosis at the surgical levels. 3. 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 4. Moderate left worse than right neural foraminal stenosis at C2-C3, also likely progressed since 2014. 5. Moderate to severe left and mild right neural foraminal stenosis at C7-T1, progressed.     Electronically Signed   By: Maude Harry M.D.   On: 05/25/2023 16:31     Narrative & Impression  CLINICAL DATA:  Post cervical fusion 10 years ago. Fall 1 month ago with persistent posterior neck pain as well as bilateral arm weakness.   EXAM: CERVICAL SPINE - COMPLETE 4+ VIEW   COMPARISON:  05/05/2023   FINDINGS: Anterior fusion hardware unchanged at the C4-5 level as the inferior left-sided  screw is missing and unchanged. 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 Minimal disc space narrowing at the C3-4 level unchanged. Mild spondylosis of the cervical spine to include uncovertebral joint spurring and facet arthropathy. No evidence of instability on flexion extension. Remainder of the exam is unchanged.   IMPRESSION: 1. No acute findings. 2. 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 3. Mild spondylosis of the cervical spine.     Electronically Signed   By: Toribio Agreste M.D.   On: 06/27/2023 13:52     I have personally reviewed the images and agree with the above interpretation.  Medical Decision Making/Assessment and Plan: Sabrina Stanley is a pleasant 63 y.o. female comes into the again today for continued arm and neck pain.  She has been followed in our clinic for some time, she has had previous anterior cervical surgeries done at another institution.  She continues to have chronic pain ever since that time.  She also has chronic pain in her bilateral upper extremities that radiates from her neck down to her arms.  In order to work this up she had an EMG and nerve conduction study which did not demonstrate any evidence of a radiculopathy.  She had an MRI which did not show any evidence of residual canal stenosis from C4-C7 or any residual foraminal stenosis at these levels.  She did have some adjacent stenosis however her pain goes into her entire upper extremity rather than a focal dermatome.  She also has significant midline neck pain and posterior occipital type headaches.  She has not had an evaluation for possible cervical spinal cord stimulator, given the fact that she has had multiple cervical spine surgeries and is decompressed with continued symptoms and negative EMG we feel that she may benefit from an evaluation for possible cervical spinal cord stimulator.  Will plan to make the referral.  In order to have that referral she will have to have a clearance by psychology/psychiatry.  We have reached out to her psychiatrist to see if they would be able to perform the service, if they are unable to we will plan to make a referral to one of the screening practices that we have used in the past.  Once we hear back we will plan on making that referral and then working on a referral for interventional pain medicine to  evaluate whether or not she may be a candidate for cervical spinal stimulation.  Thank you for involving me in the care of this patient.   Spent a total of 30 minutes with the patient evaluating her chart, face-to-face evaluation, discussion of her findings and current symptomatology, and coordination of her care.  Penne MICAEL Sharps MD/MSCR Neurosurgery

## 2023-11-06 ENCOUNTER — Other Ambulatory Visit: Payer: Self-pay | Admitting: Internal Medicine

## 2023-11-06 DIAGNOSIS — M4802 Spinal stenosis, cervical region: Secondary | ICD-10-CM

## 2023-11-06 DIAGNOSIS — R4182 Altered mental status, unspecified: Secondary | ICD-10-CM

## 2023-11-09 ENCOUNTER — Ambulatory Visit
Admission: RE | Admit: 2023-11-09 | Discharge: 2023-11-09 | Disposition: A | Discharge status: Home / Self Care | Source: Ambulatory Visit | Attending: Internal Medicine | Admitting: Internal Medicine

## 2023-11-09 ENCOUNTER — Ambulatory Visit
Admission: RE | Admit: 2023-11-09 | Discharge: 2023-11-09 | Discharge status: Home / Self Care | Source: Ambulatory Visit | Attending: Internal Medicine | Admitting: Internal Medicine

## 2023-11-09 DIAGNOSIS — M4802 Spinal stenosis, cervical region: Secondary | ICD-10-CM | POA: Diagnosis present

## 2023-11-09 DIAGNOSIS — R4182 Altered mental status, unspecified: Secondary | ICD-10-CM | POA: Insufficient documentation

## 2023-11-11 ENCOUNTER — Emergency Department

## 2023-11-11 ENCOUNTER — Emergency Department
Admission: EM | Admit: 2023-11-11 | Discharge: 2023-11-11 | Disposition: A | Discharge status: Home / Self Care | Attending: Emergency Medicine | Admitting: Emergency Medicine

## 2023-11-11 ENCOUNTER — Other Ambulatory Visit: Payer: Self-pay

## 2023-11-11 DIAGNOSIS — S52591A Other fractures of lower end of right radius, initial encounter for closed fracture: Secondary | ICD-10-CM | POA: Insufficient documentation

## 2023-11-11 DIAGNOSIS — S52124A Nondisplaced fracture of head of right radius, initial encounter for closed fracture: Secondary | ICD-10-CM | POA: Diagnosis not present

## 2023-11-11 DIAGNOSIS — Y92000 Kitchen of unspecified non-institutional (private) residence as  the place of occurrence of the external cause: Secondary | ICD-10-CM | POA: Insufficient documentation

## 2023-11-11 DIAGNOSIS — W19XXXA Unspecified fall, initial encounter: Secondary | ICD-10-CM | POA: Diagnosis not present

## 2023-11-11 DIAGNOSIS — S52501A Unspecified fracture of the lower end of right radius, initial encounter for closed fracture: Secondary | ICD-10-CM

## 2023-11-11 DIAGNOSIS — M79641 Pain in right hand: Secondary | ICD-10-CM | POA: Diagnosis present

## 2023-11-11 LAB — CBC WITH DIFFERENTIAL/PLATELET
Abs Immature Granulocytes: 0.03 10*3/uL (ref 0.00–0.07)
Basophils Absolute: 0 10*3/uL (ref 0.0–0.1)
Basophils Relative: 0 %
Eosinophils Absolute: 0.3 10*3/uL (ref 0.0–0.5)
Eosinophils Relative: 4 %
HCT: 35.4 % — ABNORMAL LOW (ref 36.0–46.0)
Hemoglobin: 11.5 g/dL — ABNORMAL LOW (ref 12.0–15.0)
Immature Granulocytes: 0 %
Lymphocytes Relative: 22 %
Lymphs Abs: 1.6 10*3/uL (ref 0.7–4.0)
MCH: 30.9 pg (ref 26.0–34.0)
MCHC: 32.5 g/dL (ref 30.0–36.0)
MCV: 95.2 fL (ref 80.0–100.0)
Monocytes Absolute: 0.6 10*3/uL (ref 0.1–1.0)
Monocytes Relative: 9 %
Neutro Abs: 4.6 10*3/uL (ref 1.7–7.7)
Neutrophils Relative %: 65 %
Platelets: 205 10*3/uL (ref 150–400)
RBC: 3.72 MIL/uL — ABNORMAL LOW (ref 3.87–5.11)
RDW: 12 % (ref 11.5–15.5)
WBC: 7.1 10*3/uL (ref 4.0–10.5)
nRBC: 0 % (ref 0.0–0.2)

## 2023-11-11 LAB — COMPREHENSIVE METABOLIC PANEL
ALT: 14 U/L (ref 0–44)
AST: 17 U/L (ref 15–41)
Albumin: 4 g/dL (ref 3.5–5.0)
Alkaline Phosphatase: 59 U/L (ref 38–126)
Anion gap: 7 (ref 5–15)
BUN: 32 mg/dL — ABNORMAL HIGH (ref 8–23)
CO2: 25 mmol/L (ref 22–32)
Calcium: 8.8 mg/dL — ABNORMAL LOW (ref 8.9–10.3)
Chloride: 104 mmol/L (ref 98–111)
Creatinine, Ser: 0.69 mg/dL (ref 0.44–1.00)
GFR, Estimated: >60 mL/min (ref >60)
Glucose, Bld: 104 mg/dL — ABNORMAL HIGH (ref 70–99)
Potassium: 3.3 mmol/L — ABNORMAL LOW (ref 3.5–5.1)
Sodium: 136 mmol/L (ref 135–145)
Total Bilirubin: 0.6 mg/dL (ref 0.0–1.2)
Total Protein: 6.8 g/dL (ref 6.5–8.1)

## 2023-11-11 MED ORDER — MORPHINE SULFATE (PF) 4 MG/ML IV SOLN
4.0000 mg | Freq: Once | INTRAVENOUS | Status: AC
  Administered 2023-11-11: 4 mg via INTRAVENOUS
  Filled 2023-11-11: qty 1

## 2023-11-11 MED ORDER — OXYCODONE-ACETAMINOPHEN 5-325 MG PO TABS: 2.0000 | ORAL_TABLET | Freq: Once | ORAL | Status: DC

## 2023-11-11 MED ORDER — ONDANSETRON HCL 4 MG/2ML IJ SOLN
4.0000 mg | Freq: Once | INTRAMUSCULAR | Status: AC
  Administered 2023-11-11: 4 mg via INTRAVENOUS
  Filled 2023-11-11: qty 2

## 2023-11-11 NOTE — ED Notes (Signed)
 Patient transported to X-ray

## 2023-11-11 NOTE — ED Notes (Signed)
 Dc instructions and follow up reviewed with pt and visitor. No questions or concerns at this time.

## 2023-11-11 NOTE — ED Triage Notes (Signed)
 Pt to ED via POV with her step daughter.  Pt reports falling twice this AM.  Pt states she was standing at kitchen sink and her L foot "hurt real bad" causing her to fall.  Pt reports pain in wrist, forearm, elbow, and upper arm.  Pt has decreased mobility in fingers and wrist.

## 2023-11-11 NOTE — ED Provider Notes (Signed)
 [facility] Provider Note    Event Date/Time   First MD Initiated Contact with Patient 11/11/23 [phone]     (approximate)   History   Arm Injury   HPI  Sabrina Stanley is a 64 y.o. female with extensive past medical history including degenerative disc disease, multiple falls, bilateral upper extremity neuropathy, prior neck surgeries who presents after fall.  Patient reports she injured her right wrist right elbow and possibly right shoulder.  She also thinks she may have hurt her neck and complains of some pain in her right shoulder blade.  Reviewed records demonstrates the patient saw her PCP 1 week ago and had MRIs of the head and cervical spine but no radiology read is available yet.  Also notably the patient follows with neurosurgery, last visit in February     Physical Exam   Triage Vital Signs: ED Triage Vitals  Encounter Vitals Group     BP 11/11/23 0551 (!) 115/52     Systolic BP Percentile --      Diastolic BP Percentile --      Pulse Rate 11/11/23 0551 79     Resp 11/11/23 0551 16     Temp 11/11/23 0551 98 F (36.7 C)     Temp Source 11/11/23 0551 Oral     SpO2 11/11/23 0551 100 %     Weight 11/11/23 0552 65.8 kg (145 lb)     Height 11/11/23 0552 1.575 m (5\' 2" )     Head Circumference --      Peak Flow --      Pain Score 11/11/23 0551 9     Pain Loc --      Pain Education --      Exclude from Growth Chart --     Most recent vital signs: Vitals:   11/11/23 0551 11/11/23 1000  BP: (!) 115/52 120/60  Pulse: 79 80  Resp: 16 18  Temp: 98 F (36.7 C) 98 F (36.7 C)  SpO2: 100% 100%     General: Awake, no distress.  CV:  Good peripheral perfusion.  Resp:  Normal effort.  Abd:  No distention.  Other:  Swelling to the right wrist, discomfort with extension of the right elbow fully mild tenderness over the right scapula.  Otherwise neurologically intact, no abdominal pain, no chest wall pain, no vertebral tenderness to palpation, no  pain with axial load on both hips   ED Results / Procedures / Treatments   Labs (all labs ordered are listed, but only abnormal results are displayed) Labs Reviewed  CBC WITH DIFFERENTIAL/PLATELET - Abnormal; Notable for the following components:      Result Value   RBC 3.72 (*)    Hemoglobin 11.5 (*)    HCT 35.4 (*)    All other components within normal limits  COMPREHENSIVE METABOLIC PANEL - Abnormal; Notable for the following components:   Potassium 3.3 (*)    Glucose, Bld 104 (*)    BUN 32 (*)    Calcium 8.8 (*)    All other components within normal limits     EKG     RADIOLOGY Wrist x-ray viewed interpret by me, suspicious for distal radius fracture, pending radiology review    PROCEDURES:  Critical Care performed:   Procedures   MEDICATIONS ORDERED IN ED: Medications  morphine (PF) 4 MG/ML injection 4 mg (4 mg Intravenous Given 11/11/23 0734)  ondansetron (ZOFRAN) injection 4 mg (4 mg Intravenous Given 11/11/23 0735)  IMPRESSION / MDM / ASSESSMENT AND PLAN / ED COURSE  I reviewed the triage vital signs and the nursing notes. Patient's presentation is most consistent with acute presentation with potential threat to life or bodily function.  Patient presents after mechanical fall as detailed above.  She initially received x-rays of her wrist elbow and right shoulder but she now complains of neck pain which was not present initially but seems to have worsened while she has been here  Will apply c-collar, send for CT cervical spine and add scapula x-ray as well  Will treat with IV morphine, IV Zofran  Patient's pain improved with treatment, imaging is consistent with likely radial head fracture, distal radius fracture, splint applied to the distal radius, splinted applied and sling to the radial head, notify Dr. Joice Lofts of orthopedics who will see her in the office  Appropriate for discharge at this time with close outpatient follow-up      FINAL  CLINICAL IMPRESSION(S) / ED DIAGNOSES   Final diagnoses:  Closed fracture of distal end of right radius, unspecified fracture morphology, initial encounter  Closed nondisplaced fracture of head of right radius, initial encounter  Fall, initial encounter     Rx / DC Orders   ED Discharge Orders     None        Note:  This document was prepared using Dragon voice recognition software and may include unintentional dictation errors.   Jene Every, MD 11/11/23 [phone]

## 2023-11-17 ENCOUNTER — Other Ambulatory Visit: Payer: Self-pay | Admitting: Acute Care

## 2023-11-17 DIAGNOSIS — F17210 Nicotine dependence, cigarettes, uncomplicated: Secondary | ICD-10-CM

## 2023-11-17 DIAGNOSIS — Z122 Encounter for screening for malignant neoplasm of respiratory organs: Secondary | ICD-10-CM

## 2023-11-17 DIAGNOSIS — Z87891 Personal history of nicotine dependence: Secondary | ICD-10-CM

## 2023-11-20 ENCOUNTER — Ambulatory Visit (INDEPENDENT_AMBULATORY_CARE_PROVIDER_SITE_OTHER): Admitting: Neurosurgery

## 2023-11-20 ENCOUNTER — Other Ambulatory Visit: Payer: Self-pay

## 2023-11-20 ENCOUNTER — Encounter: Payer: Self-pay | Admitting: Neurosurgery

## 2023-11-20 VITALS — BP 116/72 | Ht 62.0 in | Wt 145.0 lb

## 2023-11-20 DIAGNOSIS — R29898 Other symptoms and signs involving the musculoskeletal system: Secondary | ICD-10-CM | POA: Diagnosis not present

## 2023-11-20 DIAGNOSIS — M5031 Other cervical disc degeneration,  high cervical region: Secondary | ICD-10-CM | POA: Diagnosis not present

## 2023-11-20 DIAGNOSIS — Z981 Arthrodesis status: Secondary | ICD-10-CM | POA: Insufficient documentation

## 2023-11-20 DIAGNOSIS — M4712 Other spondylosis with myelopathy, cervical region: Secondary | ICD-10-CM | POA: Insufficient documentation

## 2023-11-20 DIAGNOSIS — M47812 Spondylosis without myelopathy or radiculopathy, cervical region: Secondary | ICD-10-CM

## 2023-11-20 DIAGNOSIS — R296 Repeated falls: Secondary | ICD-10-CM

## 2023-11-20 DIAGNOSIS — Z01818 Encounter for other preprocedural examination: Secondary | ICD-10-CM

## 2023-11-20 NOTE — Progress Notes (Unsigned)
 Referring Physician:  Marguarite Arbour, MD [address] [facility] [place],  [place] [address]  Primary Physician:  Marguarite Arbour, MD  History of Present Illness: 11/20/2023  Ms. Napoleon is here today for follow-up.  When I saw her most recently her neck and hand pain was continuing to worsen.  Unfortunately she has had a few recent falls over the past few months and has had a progressive decline in her function.  She has bilateral upper extremity weakness numbness and tingling.  Her most recent EMG was negative for any radiculopathy, however after this is when she started having her falls.  We are going to evaluate her for possible cervical spinal cord stimulator, however after one of her most recent falls it appears she likely had a disc herniation with compression of her cervical cord at the adjacent level.  She has just seen neurology who is concerned for progressive cervical myelopathy.  We agree with their evaluation.  Review of Systems:  A 10 point review of systems is negative, except for the pertinent positives and negatives detailed in the HPI.  Past Medical History: Past Medical History:  Diagnosis Date   Abnormal mammogram, unspecified    Alcoholism (HCC)    Allergic state    Anemia    Anxiety    Arthritis    Bilateral breast cysts    Bipolar 1 disorder (HCC)    Bipolar 1 disorder (HCC)    Bone marrow disorder    Bronchitis    Cervical stenosis of spine    Cervicalgia    Chronic diarrhea    Chronic fatigue disorder    Chronic pain    COPD (chronic obstructive pulmonary disease) (HCC)    COVID-19 05/2020   Depression    Depression    Eczema    Esophageal reflux    Fibromyalgia    GERD (gastroesophageal reflux disease)    Headache    Hiatal hernia    History of esophagogastroduodenoscopy (EGD)    Hyperlipidemia    Hyperlipidemia    Hypertension    Hypothyroidism    Insomnia    Lithium toxicity    Menstrual irregularity    Myalgia     Neuropathy    lower legs   Osteopenia    Osteopenia    Panic disorder    Pleurisy    Pneumonia    Psoriasis    PVD (peripheral vascular disease) (HCC)    Sleep apnea    No CPAP   Tobacco abuse    UTI (urinary tract infection)    Vitamin D deficiency    Wears dentures    partial lower    Past Surgical History: Past Surgical History:  Procedure Laterality Date   ABDOMINAL HYSTERECTOMY  1996   BACK SURGERY     BREAST BIOPSY Right 08/21/2012   FIBROADENOMATOUS CHANGE WITH CALCIFICATIONS.    BREAST BIOPSY Right    FIBROUS SCARRING, negative for atypia   CATARACT EXTRACTION W/PHACO Right 11/16/2020   Procedure: CATARACT EXTRACTION PHACO AND INTRAOCULAR LENS PLACEMENT (IOC) RIGHT;  Surgeon: Nevada Crane, MD;  Location: [facility];  Service: Ophthalmology;  Laterality: Right;  0.77 0:12.9   CATARACT EXTRACTION W/PHACO Left 12/14/2020   Procedure: CATARACT EXTRACTION PHACO AND INTRAOCULAR LENS PLACEMENT (IOC) LEFT;  Surgeon: Nevada Crane, MD;  Location: [facility];  Service: Ophthalmology;  Laterality: Left;  1.25 0:12.0   COLONOSCOPY     COLONOSCOPY N/A 04/27/2022   Procedure: COLONOSCOPY;  Surgeon: Toledo, Boykin Nearing, MD;  Location: [facility];  Service: Gastroenterology;  Laterality: N/A;   DILATION AND CURETTAGE OF UTERUS  1979, 1980   ESOPHAGOGASTRODUODENOSCOPY     ESOPHAGOGASTRODUODENOSCOPY N/A 09/17/2015   Procedure: ESOPHAGOGASTRODUODENOSCOPY (EGD);  Surgeon: Wallace Cullens, MD;  Location: [facility];  Service: Gastroenterology;  Laterality: N/A;   ESOPHAGOGASTRODUODENOSCOPY N/A 04/27/2022   Procedure: ESOPHAGOGASTRODUODENOSCOPY (EGD);  Surgeon: Toledo, Boykin Nearing, MD;  Location: [facility];  Service: Gastroenterology;  Laterality: N/A;   EYE SURGERY  2010   KNEE SURGERY Right 1998   meniscus repair   NASAL SINUS SURGERY  1998, 2000   NECK SURGERY     x 2   OOPHORECTOMY     SPINE SURGERY  2006, 2008   cervical fusion   TONSILLECTOMY       Allergies: Allergies as of 11/20/2023 - Review Complete 11/20/2023  Allergen Reaction Noted   Asa [aspirin]  12/27/2012   Codeine     Cyclobenzaprine Other (See Comments) 01/06/2014   Darvocet [propoxyphene n-acetaminophen]  12/27/2012   Effexor [venlafaxine hcl] Other (See Comments) 12/27/2012   Erythromycin Other (See Comments) 01/06/2014   Meperidine-promethazine     Neurontin [gabapentin]  12/27/2012   Nsaids  09/02/2013   Propoxyphene     Mobic [meloxicam] Palpitations 12/27/2012   Penicillins Rash 12/27/2012    Medications:  Current Outpatient Medications:    albuterol (VENTOLIN HFA) 108 (90 Base) MCG/ACT inhaler, Inhale 2 puffs into the lungs every 6 (six) hours as needed., Disp: , Rfl:    amphetamine-dextroamphetamine (ADDERALL XR) 30 MG 24 hr capsule, Take 30 mg by mouth daily., Disp: , Rfl:    atorvastatin (LIPITOR) 10 MG tablet, , Disp: , Rfl:    Biotin 5 MG CAPS, Take 1,000 mg by mouth daily., Disp: , Rfl:    bisoprolol-hydrochlorothiazide (ZIAC) 2.5-6.25 MG tablet, Take 1 tablet by mouth daily., Disp: , Rfl:    buPROPion (WELLBUTRIN) 100 MG tablet, Take 100 mg by mouth daily., Disp: , Rfl:    Cholecalciferol 125 MCG (5000 UT) TABS, Take by mouth., Disp: , Rfl:    clobetasol cream (TEMOVATE) 0.05 %, Apply twice daily to hands up to 2 weeks as needed, Disp: 45 g, Rfl: 2   cyanocobalamin (,VITAMIN B-12,) 1000 MCG/ML injection, Inject into the muscle., Disp: , Rfl:    diclofenac sodium (VOLTAREN) 1 % GEL, Apply topically., Disp: , Rfl:    divalproex (DEPAKOTE ER) 500 MG 24 hr tablet, , Disp: , Rfl:    esomeprazole (NEXIUM) 40 MG capsule, Take 40 mg by mouth 2 (two) times daily., Disp: , Rfl:    famotidine (PEPCID) 40 MG tablet, Take 40 mg by mouth daily., Disp: , Rfl:    FLUoxetine (PROZAC) 20 MG capsule, Take 60 mg by mouth daily. , Disp: , Rfl:    Fluticasone-Salmeterol (ADVAIR) 100-50 MCG/DOSE AEPB, Inhale 1 puff into the lungs daily as needed., Disp: , Rfl:     furosemide (LASIX) 20 MG tablet, Take 20 mg by mouth as needed for edema., Disp: , Rfl:    methocarbamol (ROBAXIN) 750 MG tablet, Take 750 mg by mouth 3 (three) times daily., Disp: , Rfl:    montelukast (SINGULAIR) 10 MG tablet, Take 10 mg by mouth daily., Disp: , Rfl:    morphine (MS CONTIN) 15 MG 12 hr tablet, Limit 2-3 tabs by mouth per day if tolerated (Patient taking differently: Take 15 mg by mouth. Limit 2-3 tabs by mouth per day if tolerated), Disp: 90 tablet, Rfl:  0   naloxone (NARCAN) 4 MG/0.1ML LIQD nasal spray kit, Place into the nose., Disp: , Rfl:    ondansetron (ZOFRAN-ODT) 4 MG disintegrating tablet, , Disp: , Rfl:    oxyCODONE-acetaminophen (PERCOCET) 10-325 MG tablet, Take 1 tablet by mouth every 4 (four) hours as needed., Disp: , Rfl:    potassium chloride SA (K-DUR,KLOR-CON) 20 MEQ tablet, Take 20 mEq by mouth daily., Disp: , Rfl:    predniSONE (DELTASONE) 10 MG tablet, Take 10 mg by mouth as directed., Disp: , Rfl:    primidone (MYSOLINE) 50 MG tablet, Take 50 mg by mouth 2 (two) times daily., Disp: , Rfl:    rOPINIRole (REQUIP) 1 MG tablet, Take 1 mg by mouth at bedtime. , Disp: , Rfl:    SUMAtriptan (IMITREX) 100 MG tablet, Take 100 mg by mouth every 2 (two) hours as needed for migraine., Disp: , Rfl:    topiramate (TOPAMAX) 200 MG tablet, Take 200 mg by mouth daily., Disp: , Rfl:    triamcinolone cream (KENALOG) 0.1 %, Apply 1 application topically See admin instructions. Apply nightly to legs Monday - Friday only. Avoid applying to face, groin, and axilla. Use as directed., Disp: 453.6 g, Rfl: 2   Vitamin D, Ergocalciferol, (DRISDOL) 50000 units CAPS capsule, , Disp: , Rfl:   Current Facility-Administered Medications:    lidocaine (PF) (XYLOCAINE) 1 % injection 10 mL, 10 mL, Subcutaneous, Once, Ewing Schlein, MD   midazolam (VERSED) 5 MG/5ML injection 5 mg, 5 mg, Intravenous, Once, Ewing Schlein, MD   orphenadrine (NORFLEX) injection 60 mg, 60 mg, Intramuscular, Once,  Ewing Schlein, MD  Social History: Social History   Tobacco Use   Smoking status: Every Day    Current packs/day: 1.25    Average packs/day: 1.3 packs/day for 47.0 years (58.8 ttl pk-yrs)    Types: Cigarettes    Passive exposure: Current   Smokeless tobacco: Never   Tobacco comments:    started age 56  Vaping Use   Vaping status: Former  Substance Use Topics   Alcohol use: No    Alcohol/week: 0.0 standard drinks of alcohol   Drug use: No    Family Medical History: Family History  Problem Relation Age of Onset   Depression Mother    Hypertension Mother    Lung cancer Father    Cancer Father    Lung cancer Paternal Uncle    Cervical cancer Maternal Grandmother    Breast cancer Other     Physical Examination: Vitals:   11/20/23 1346  BP: 116/72    General: Patient is in no apparent distress. Attention to examination is appropriate.  Neck:   Supple.  Full range of motion.  Respiratory: Patient is breathing without any difficulty.   NEUROLOGICAL:     Awake, alert, oriented to person, place, and time.  Speech is clear and fluent.   Cranial Nerves: Pupils equal round and reactive to light.  Facial tone is symmetric.  Facial sensation is symmetric. Shoulder shrug is symmetric. Tongue protrusion is midline.    Strength: Side Biceps Triceps Deltoid Interossei Grip Wrist Ext. Wrist Flex.  R 4- 4- 3 4- 4 3 3   L 4+ 4+ 4+ 4+ 4+ 4- 4-  =  Reflexes are 3+and symmetric at the biceps, triceps, brachioradialis, patella and achilles.  Bilateral Hoffman's reflex with spreading crossed adductor present.     Gait appears unsteady.   Imaging: Narrative & Impression  CLINICAL DATA:  Fall 3 weeks ago, neck pain radiating to the  left shoulder. History of fusion.   EXAM: MRI CERVICAL SPINE WITHOUT CONTRAST   TECHNIQUE: Multiplanar, multisequence MR imaging of the cervical spine was performed. No intravenous contrast was administered.   COMPARISON:  Cervical spine CT  05/28/2015, cervical spine MRI 11/07/2012   FINDINGS: Alignment: There is trace retrolisthesis of C3 on C4 and trace anterolisthesis of C7 on T1, not significantly changed.   Vertebrae: There is mild compression deformity of the superior T3 and T4 endplates with mild marrow edema consistent with subacute fractures, new since 11/25/2022 and not significantly changed since 05/09/2023. There is no bony retropulsion at either level.   Cervical vertebral body heights are preserved, without other evidence of acute fracture. The patient is status post fusion from C4 through C7 with ACDF hardware remaining at C4-C5. There is no suspicious marrow signal abnormality or other marrow edema.   Cord: Normal in signal and morphology.   Posterior Fossa, vertebral arteries, paraspinal tissues: The imaged posterior fossa is unremarkable. The vertebral artery flow voids are normal. The paraspinal soft tissues are unremarkable.   Disc levels:   Evaluation of the neural foramina is degraded by motion artifact. Within this confine:   C2-C3: There is mild uncovertebral ridging and moderate left worse than right facet arthropathy resulting in probable moderate left worse than right neural foraminal stenosis without significant spinal canal stenosis which appears progressed since 2014.   C3-C4: There is trace retrolisthesis with a broad-based posterior disc osteophyte complex, prominent bilateral uncovertebral ridging, and mild bilateral facet arthropathy resulting in mild spinal canal stenosis and probable severe right worse than left neural foraminal stenosis, likely progressed since 2014.   C4-C5: Status post ACDF without residual spinal canal or neural foraminal stenosis   C5-C6: Status post fusion without residual spinal canal or neural foraminal stenosis.   C6-C7: Status post fusion without significant spinal canal or neural foraminal stenosis.   C7-T1: There is trace anterolisthesis  with left worse than right facet arthropathy and mild endplate spurring resulting in moderate to severe left and mild right neural foraminal stenosis without significant spinal canal stenosis, progressed since 2014.   IMPRESSION: 1. Subacute compression fractures of the superior T3 and T4 endplates with mild marrow edema and no retropulsion, not significantly changed since 05/09/2023. 2. Status post fusion from C4 through C7 without residual spinal canal or neural foraminal stenosis at the surgical levels. 3. 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 4. Moderate left worse than right neural foraminal stenosis at C2-C3, also likely progressed since 2014. 5. Moderate to severe left and mild right neural foraminal stenosis at C7-T1, progressed.     Electronically Signed   By: Lesia Hausen M.D.   On: 05/25/2023 16:31     Narrative & Impression  CLINICAL DATA:  Post cervical fusion 10 years ago. Fall 1 month ago with persistent posterior neck pain as well as bilateral arm weakness.   EXAM: CERVICAL SPINE - COMPLETE 4+ VIEW   COMPARISON:  05/05/2023   FINDINGS: Anterior fusion hardware unchanged at the C4-5 level as the inferior left-sided screw is missing and unchanged. 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 Minimal disc space narrowing at the C3-4 level unchanged. Mild spondylosis of the cervical spine to include uncovertebral joint spurring and facet arthropathy. No evidence of instability on flexion extension. Remainder of the exam is unchanged.   IMPRESSION: 1. No acute  findings. 2. 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 3. Mild spondylosis of the cervical spine.     Electronically Signed   By: Elberta Fortis M.D.   On: 06/27/2023 13:52   Narrative &  Impression  CLINICAL DATA:  Chronic neck and hand pain with bilateral arm pain   EXAM: MRI CERVICAL SPINE WITHOUT CONTRAST   TECHNIQUE: Multiplanar, multisequence MR imaging of the cervical spine was performed. No intravenous contrast was administered.   COMPARISON:  05/19/2023   FINDINGS: Alignment: Mild retrolisthesis at C3-4   Vertebrae: Interval healing of superior endplate fractures at T3 and T4. New degenerative marrow edema on both sides of the C3-4 disc space. C4-C7 ACDF with solid arthrodesis. No acute fracture or aggressive bone lesion   Cord: Degenerative flattening at C3-4.  No detected cord edema.   Posterior Fossa, vertebral arteries, paraspinal tissues: No perispinal mass or inflammation seen.   Disc levels:   C2-3: Uncovertebral and facet spurring causes moderate bilateral foraminal stenosis.   C3-4: Disc collapse and endplate degeneration with disc bulging and protrusion. Ligamentum flavum thickening. Progressive spinal stenosis with cord flattening.   C4-5: ACDF with solid arthrodesis and no impingement   C5-6: ACDF with solid arthrodesis and no impingement   C6-7: ACDF with solid arthrodesis and no impingement   C7-T1:Degenerative facet spurring and disc space narrowing with left uncovertebral spurring. Advanced left foraminal impingement.   IMPRESSION: 1. Progressive adjacent segment degeneration at C3-4 with new discogenic endplate edema and progressive spinal stenosis with cord flattening. Advanced bilateral foraminal impingement at this level. 2. C7-T1 advanced left foraminal impingement. 3. Moderate bilateral C2-3 foraminal stenoses. 4. C4-C7 ACDF with solid arthrodesis. 5. Motion degraded.     Electronically Signed   By: Tiburcio Pea M.D.   On: 11/11/2023 08:12      I have personally reviewed the images and agree with the above interpretation.  This is quite significantly worse from her last MRI in September of the previous year.   She now has evidence of myelomalacia, likely secondary to a central cord type injury after some of her recent falls.  Medical Decision Making/Assessment and Plan: Ms. Heney is a pleasant 63 y.o. female comes into the again today for continued arm and neck pain.  We are previously evaluating for possible cervical spinal cord stimulator, however she has had worsened myelopathy and has had multiple recent falls with progressive hand numbness and decreased ability to use her extremities.  She was recently seen by neurology who diagnosed her with progressive myelopathy.  Is likely she suffered a spinal cord injury after one of her recent falls which caused her acute worsening.  On physical examination she has progressive weakness and progressive myelopathy.  She has decreased utilization of her upper extremities and hands bilaterally.  She gets Lhermitte's phenomenon.  Most recent MRI shows significant worsening at the C3-4 level with adjacent segment disease.  At this point given her progressive cervical myelopathy I do recommend a posterior cervical decompression C3-4 and fusion from C3-C6.  I did discuss the use of BMP given the fact that she is a smoker, unfortunately given her progressive neurologic demise we feel that it would be prudent to take her to surgery regardless of her smoking status.  We discussed the risk and benefits of surgery.  We discussed the risk of C5 palsy as well as worsening of her myelomalacia from a reperfusion type injury.  We discussed that  in the setting of smoking she has decreased chance for fusion and urged her to quit.  Lovenia Kim MD/MSCR Neurosurgery

## 2023-11-20 NOTE — Patient Instructions (Signed)
 Please see below for information in regards to your upcoming surgery:   Planned surgery: C3-6 posterior spinal fusion, C3-4 laminectomy   Surgery date: 12/05/23 at [facility] (Medical Mall: [address], [place], [place] [address]) - you will find out your arrival time the business day before your surgery.   Pre-op appointment at [facility] Pre-admit Testing: we will call you with a date/time for this. If you are scheduled for an in person appointment, Pre-admit Testing is located on the first floor of the [facility] building, [address] [address] [facility] [address], [address].  During this appointment, they will advise you which medications you can take the morning of surgery, and which medications you will need to hold for surgery. Labs (such as blood work, EKG) may be done at your pre-op appointment. You are not required to fast for these labs. Should you need to change your pre-op appointment, please call Pre-admit testing at [phone].     NSAIDS (Non-steroidal anti-inflammatory drugs): because you are having a fusion, please avoid taking any NSAIDS (examples: ibuprofen, motrin, aleve, naproxen, meloxicam, diclofenac) for 3 months after surgery. Celebrex is an exception and is OK to take, if prescribed. Tylenol is not an NSAID.    Common restrictions after surgery: No bending, lifting, or twisting ("BLT"). Avoid lifting objects heavier than 10 pounds for the first 6 weeks after surgery. Where possible, avoid household activities that involve lifting, bending, reaching, pushing, or pulling such as laundry, vacuuming, grocery shopping, and childcare. Try to arrange for help from friends and family for these activities while you heal. Do not drive while taking prescription pain medication. Weeks 6 through 12 after surgery: avoid lifting more than 25 pounds.    X-rays after surgery: Because you are having a fusion: for appointments after your 2 week follow-up: please arrive  at the [facility] outpatient imaging center ([address], [address], [place]) or [facility] one hour prior to your appointment for x-rays. This applies to every appointment after your 2 week follow-up. Failure to do so may result in your appointment being rescheduled.   How to contact us:  If you have any questions/concerns before or after surgery, you can reach [place] at [phone], or you can send a mychart message. We can be reached by phone or mychart 8am-4pm, Monday-Friday.  *Please note: Calls after 4pm are forwarded to a third party answering service. Mychart messages are not routinely monitored during evenings, weekends, and holidays. Please call our office to contact the answering service for urgent concerns during non-business hours.    If you have FMLA/disability paperwork, please drop it off or fax it to [phone], attention Patty.   Appointments/FMLA & disability paperwork: Joycelyn Rua, & Flonnie Hailstone Registered Nurse/Surgery scheduler: Royston Cowper Medical Assistants: Nash Mantis Physician Assistants: Joan Flores, PA-C, Manning Charity, PA-C & Drake Leach, PA-C Surgeons: Venetia Night, MD & Ernestine Mcmurray, MD

## 2023-11-20 NOTE — H&P (View-Only) (Signed)
 Referring Physician:  Marguarite Arbour, MD [address] [facility] [place],  [place] [address]  Primary Physician:  Marguarite Arbour, MD  History of Present Illness: 11/20/2023  Sabrina Stanley is here today for follow-up.  When I saw her most recently her neck and hand pain was continuing to worsen.  Unfortunately she has had a few recent falls over the past few months and has had a progressive decline in her function.  She has bilateral upper extremity weakness numbness and tingling.  Her most recent EMG was negative for any radiculopathy, however after this is when she started having her falls.  We are going to evaluate her for possible cervical spinal cord stimulator, however after one of her most recent falls it appears she likely had a disc herniation with compression of her cervical cord at the adjacent level.  She has just seen neurology who is concerned for progressive cervical myelopathy.  We agree with their evaluation.  Review of Systems:  A 10 point review of systems is negative, except for the pertinent positives and negatives detailed in the HPI.  Past Medical History: Past Medical History:  Diagnosis Date   Abnormal mammogram, unspecified    Alcoholism (HCC)    Allergic state    Anemia    Anxiety    Arthritis    Bilateral breast cysts    Bipolar 1 disorder (HCC)    Bipolar 1 disorder (HCC)    Bone marrow disorder    Bronchitis    Cervical stenosis of spine    Cervicalgia    Chronic diarrhea    Chronic fatigue disorder    Chronic pain    COPD (chronic obstructive pulmonary disease) (HCC)    COVID-19 05/2020   Depression    Depression    Eczema    Esophageal reflux    Fibromyalgia    GERD (gastroesophageal reflux disease)    Headache    Hiatal hernia    History of esophagogastroduodenoscopy (EGD)    Hyperlipidemia    Hyperlipidemia    Hypertension    Hypothyroidism    Insomnia    Lithium toxicity    Menstrual irregularity    Myalgia     Neuropathy    lower legs   Osteopenia    Osteopenia    Panic disorder    Pleurisy    Pneumonia    Psoriasis    PVD (peripheral vascular disease) (HCC)    Sleep apnea    No CPAP   Tobacco abuse    UTI (urinary tract infection)    Vitamin D deficiency    Wears dentures    partial lower    Past Surgical History: Past Surgical History:  Procedure Laterality Date   ABDOMINAL HYSTERECTOMY  1996   BACK SURGERY     BREAST BIOPSY Right 08/21/2012   FIBROADENOMATOUS CHANGE WITH CALCIFICATIONS.    BREAST BIOPSY Right    FIBROUS SCARRING, negative for atypia   CATARACT EXTRACTION W/PHACO Right 11/16/2020   Procedure: CATARACT EXTRACTION PHACO AND INTRAOCULAR LENS PLACEMENT (IOC) RIGHT;  Surgeon: Nevada Crane, MD;  Location: [facility];  Service: Ophthalmology;  Laterality: Right;  0.77 0:12.9   CATARACT EXTRACTION W/PHACO Left 12/14/2020   Procedure: CATARACT EXTRACTION PHACO AND INTRAOCULAR LENS PLACEMENT (IOC) LEFT;  Surgeon: Nevada Crane, MD;  Location: [facility];  Service: Ophthalmology;  Laterality: Left;  1.25 0:12.0   COLONOSCOPY     COLONOSCOPY N/A 04/27/2022   Procedure: COLONOSCOPY;  Surgeon: Toledo, Boykin Nearing, MD;  Location: [facility];  Service: Gastroenterology;  Laterality: N/A;   DILATION AND CURETTAGE OF UTERUS  1979, 1980   ESOPHAGOGASTRODUODENOSCOPY     ESOPHAGOGASTRODUODENOSCOPY N/A 09/17/2015   Procedure: ESOPHAGOGASTRODUODENOSCOPY (EGD);  Surgeon: Wallace Cullens, MD;  Location: [facility];  Service: Gastroenterology;  Laterality: N/A;   ESOPHAGOGASTRODUODENOSCOPY N/A 04/27/2022   Procedure: ESOPHAGOGASTRODUODENOSCOPY (EGD);  Surgeon: Toledo, Boykin Nearing, MD;  Location: [facility];  Service: Gastroenterology;  Laterality: N/A;   EYE SURGERY  2010   KNEE SURGERY Right 1998   meniscus repair   NASAL SINUS SURGERY  1998, 2000   NECK SURGERY     x 2   OOPHORECTOMY     SPINE SURGERY  2006, 2008   cervical fusion   TONSILLECTOMY       Allergies: Allergies as of 11/20/2023 - Review Complete 11/20/2023  Allergen Reaction Noted   Asa [aspirin]  12/27/2012   Codeine     Cyclobenzaprine Other (See Comments) 01/06/2014   Darvocet [propoxyphene n-acetaminophen]  12/27/2012   Effexor [venlafaxine hcl] Other (See Comments) 12/27/2012   Erythromycin Other (See Comments) 01/06/2014   Meperidine-promethazine     Neurontin [gabapentin]  12/27/2012   Nsaids  09/02/2013   Propoxyphene     Mobic [meloxicam] Palpitations 12/27/2012   Penicillins Rash 12/27/2012    Medications:  Current Outpatient Medications:    albuterol (VENTOLIN HFA) 108 (90 Base) MCG/ACT inhaler, Inhale 2 puffs into the lungs every 6 (six) hours as needed., Disp: , Rfl:    amphetamine-dextroamphetamine (ADDERALL XR) 30 MG 24 hr capsule, Take 30 mg by mouth daily., Disp: , Rfl:    atorvastatin (LIPITOR) 10 MG tablet, , Disp: , Rfl:    Biotin 5 MG CAPS, Take 1,000 mg by mouth daily., Disp: , Rfl:    bisoprolol-hydrochlorothiazide (ZIAC) 2.5-6.25 MG tablet, Take 1 tablet by mouth daily., Disp: , Rfl:    buPROPion (WELLBUTRIN) 100 MG tablet, Take 100 mg by mouth daily., Disp: , Rfl:    Cholecalciferol 125 MCG (5000 UT) TABS, Take by mouth., Disp: , Rfl:    clobetasol cream (TEMOVATE) 0.05 %, Apply twice daily to hands up to 2 weeks as needed, Disp: 45 g, Rfl: 2   cyanocobalamin (,VITAMIN B-12,) 1000 MCG/ML injection, Inject into the muscle., Disp: , Rfl:    diclofenac sodium (VOLTAREN) 1 % GEL, Apply topically., Disp: , Rfl:    divalproex (DEPAKOTE ER) 500 MG 24 hr tablet, , Disp: , Rfl:    esomeprazole (NEXIUM) 40 MG capsule, Take 40 mg by mouth 2 (two) times daily., Disp: , Rfl:    famotidine (PEPCID) 40 MG tablet, Take 40 mg by mouth daily., Disp: , Rfl:    FLUoxetine (PROZAC) 20 MG capsule, Take 60 mg by mouth daily. , Disp: , Rfl:    Fluticasone-Salmeterol (ADVAIR) 100-50 MCG/DOSE AEPB, Inhale 1 puff into the lungs daily as needed., Disp: , Rfl:     furosemide (LASIX) 20 MG tablet, Take 20 mg by mouth as needed for edema., Disp: , Rfl:    methocarbamol (ROBAXIN) 750 MG tablet, Take 750 mg by mouth 3 (three) times daily., Disp: , Rfl:    montelukast (SINGULAIR) 10 MG tablet, Take 10 mg by mouth daily., Disp: , Rfl:    morphine (MS CONTIN) 15 MG 12 hr tablet, Limit 2-3 tabs by mouth per day if tolerated (Patient taking differently: Take 15 mg by mouth. Limit 2-3 tabs by mouth per day if tolerated), Disp: 90 tablet, Rfl:  0   naloxone (NARCAN) 4 MG/0.1ML LIQD nasal spray kit, Place into the nose., Disp: , Rfl:    ondansetron (ZOFRAN-ODT) 4 MG disintegrating tablet, , Disp: , Rfl:    oxyCODONE-acetaminophen (PERCOCET) 10-325 MG tablet, Take 1 tablet by mouth every 4 (four) hours as needed., Disp: , Rfl:    potassium chloride SA (K-DUR,KLOR-CON) 20 MEQ tablet, Take 20 mEq by mouth daily., Disp: , Rfl:    predniSONE (DELTASONE) 10 MG tablet, Take 10 mg by mouth as directed., Disp: , Rfl:    primidone (MYSOLINE) 50 MG tablet, Take 50 mg by mouth 2 (two) times daily., Disp: , Rfl:    rOPINIRole (REQUIP) 1 MG tablet, Take 1 mg by mouth at bedtime. , Disp: , Rfl:    SUMAtriptan (IMITREX) 100 MG tablet, Take 100 mg by mouth every 2 (two) hours as needed for migraine., Disp: , Rfl:    topiramate (TOPAMAX) 200 MG tablet, Take 200 mg by mouth daily., Disp: , Rfl:    triamcinolone cream (KENALOG) 0.1 %, Apply 1 application topically See admin instructions. Apply nightly to legs Monday - Friday only. Avoid applying to face, groin, and axilla. Use as directed., Disp: 453.6 g, Rfl: 2   Vitamin D, Ergocalciferol, (DRISDOL) 50000 units CAPS capsule, , Disp: , Rfl:   Current Facility-Administered Medications:    lidocaine (PF) (XYLOCAINE) 1 % injection 10 mL, 10 mL, Subcutaneous, Once, Ewing Schlein, MD   midazolam (VERSED) 5 MG/5ML injection 5 mg, 5 mg, Intravenous, Once, Ewing Schlein, MD   orphenadrine (NORFLEX) injection 60 mg, 60 mg, Intramuscular, Once,  Ewing Schlein, MD  Social History: Social History   Tobacco Use   Smoking status: Every Day    Current packs/day: 1.25    Average packs/day: 1.3 packs/day for 47.0 years (58.8 ttl pk-yrs)    Types: Cigarettes    Passive exposure: Current   Smokeless tobacco: Never   Tobacco comments:    started age 69  Vaping Use   Vaping status: Former  Substance Use Topics   Alcohol use: No    Alcohol/week: 0.0 standard drinks of alcohol   Drug use: No    Family Medical History: Family History  Problem Relation Age of Onset   Depression Mother    Hypertension Mother    Lung cancer Father    Cancer Father    Lung cancer Paternal Uncle    Cervical cancer Maternal Grandmother    Breast cancer Other     Physical Examination: Vitals:   11/20/23 1346  BP: 116/72    General: Patient is in no apparent distress. Attention to examination is appropriate.  Neck:   Supple.  Full range of motion.  Respiratory: Patient is breathing without any difficulty.   NEUROLOGICAL:     Awake, alert, oriented to person, place, and time.  Speech is clear and fluent.   Cranial Nerves: Pupils equal round and reactive to light.  Facial tone is symmetric.  Facial sensation is symmetric. Shoulder shrug is symmetric. Tongue protrusion is midline.    Strength: Side Biceps Triceps Deltoid Interossei Grip Wrist Ext. Wrist Flex.  R 4- 4- 3 4- 4 3 3   L 4+ 4+ 4+ 4+ 4+ 4- 4-  =  Reflexes are 3+and symmetric at the biceps, triceps, brachioradialis, patella and achilles.  Bilateral Hoffman's reflex with spreading crossed adductor present.     Gait appears unsteady.   Imaging: Narrative & Impression  CLINICAL DATA:  Fall 3 weeks ago, neck pain radiating to the  left shoulder. History of fusion.   EXAM: MRI CERVICAL SPINE WITHOUT CONTRAST   TECHNIQUE: Multiplanar, multisequence MR imaging of the cervical spine was performed. No intravenous contrast was administered.   COMPARISON:  Cervical spine CT  05/28/2015, cervical spine MRI 11/07/2012   FINDINGS: Alignment: There is trace retrolisthesis of C3 on C4 and trace anterolisthesis of C7 on T1, not significantly changed.   Vertebrae: There is mild compression deformity of the superior T3 and T4 endplates with mild marrow edema consistent with subacute fractures, new since 11/25/2022 and not significantly changed since 05/09/2023. There is no bony retropulsion at either level.   Cervical vertebral body heights are preserved, without other evidence of acute fracture. The patient is status post fusion from C4 through C7 with ACDF hardware remaining at C4-C5. There is no suspicious marrow signal abnormality or other marrow edema.   Cord: Normal in signal and morphology.   Posterior Fossa, vertebral arteries, paraspinal tissues: The imaged posterior fossa is unremarkable. The vertebral artery flow voids are normal. The paraspinal soft tissues are unremarkable.   Disc levels:   Evaluation of the neural foramina is degraded by motion artifact. Within this confine:   C2-C3: There is mild uncovertebral ridging and moderate left worse than right facet arthropathy resulting in probable moderate left worse than right neural foraminal stenosis without significant spinal canal stenosis which appears progressed since 2014.   C3-C4: There is trace retrolisthesis with a broad-based posterior disc osteophyte complex, prominent bilateral uncovertebral ridging, and mild bilateral facet arthropathy resulting in mild spinal canal stenosis and probable severe right worse than left neural foraminal stenosis, likely progressed since 2014.   C4-C5: Status post ACDF without residual spinal canal or neural foraminal stenosis   C5-C6: Status post fusion without residual spinal canal or neural foraminal stenosis.   C6-C7: Status post fusion without significant spinal canal or neural foraminal stenosis.   C7-T1: There is trace anterolisthesis  with left worse than right facet arthropathy and mild endplate spurring resulting in moderate to severe left and mild right neural foraminal stenosis without significant spinal canal stenosis, progressed since 2014.   IMPRESSION: 1. Subacute compression fractures of the superior T3 and T4 endplates with mild marrow edema and no retropulsion, not significantly changed since 05/09/2023. 2. Status post fusion from C4 through C7 without residual spinal canal or neural foraminal stenosis at the surgical levels. 3. 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 4. Moderate left worse than right neural foraminal stenosis at C2-C3, also likely progressed since 2014. 5. Moderate to severe left and mild right neural foraminal stenosis at C7-T1, progressed.     Electronically Signed   By: Lesia Hausen M.D.   On: 05/25/2023 16:31     Narrative & Impression  CLINICAL DATA:  Post cervical fusion 10 years ago. Fall 1 month ago with persistent posterior neck pain as well as bilateral arm weakness.   EXAM: CERVICAL SPINE - COMPLETE 4+ VIEW   COMPARISON:  05/05/2023   FINDINGS: Anterior fusion hardware unchanged at the C4-5 level as the inferior left-sided screw is missing and unchanged. 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 Minimal disc space narrowing at the C3-4 level unchanged. Mild spondylosis of the cervical spine to include uncovertebral joint spurring and facet arthropathy. No evidence of instability on flexion extension. Remainder of the exam is unchanged.   IMPRESSION: 1. No acute  findings. 2. 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 3. Mild spondylosis of the cervical spine.     Electronically Signed   By: Elberta Fortis M.D.   On: 06/27/2023 13:52   Narrative &  Impression  CLINICAL DATA:  Chronic neck and hand pain with bilateral arm pain   EXAM: MRI CERVICAL SPINE WITHOUT CONTRAST   TECHNIQUE: Multiplanar, multisequence MR imaging of the cervical spine was performed. No intravenous contrast was administered.   COMPARISON:  05/19/2023   FINDINGS: Alignment: Mild retrolisthesis at C3-4   Vertebrae: Interval healing of superior endplate fractures at T3 and T4. New degenerative marrow edema on both sides of the C3-4 disc space. C4-C7 ACDF with solid arthrodesis. No acute fracture or aggressive bone lesion   Cord: Degenerative flattening at C3-4.  No detected cord edema.   Posterior Fossa, vertebral arteries, paraspinal tissues: No perispinal mass or inflammation seen.   Disc levels:   C2-3: Uncovertebral and facet spurring causes moderate bilateral foraminal stenosis.   C3-4: Disc collapse and endplate degeneration with disc bulging and protrusion. Ligamentum flavum thickening. Progressive spinal stenosis with cord flattening.   C4-5: ACDF with solid arthrodesis and no impingement   C5-6: ACDF with solid arthrodesis and no impingement   C6-7: ACDF with solid arthrodesis and no impingement   C7-T1:Degenerative facet spurring and disc space narrowing with left uncovertebral spurring. Advanced left foraminal impingement.   IMPRESSION: 1. Progressive adjacent segment degeneration at C3-4 with new discogenic endplate edema and progressive spinal stenosis with cord flattening. Advanced bilateral foraminal impingement at this level. 2. C7-T1 advanced left foraminal impingement. 3. Moderate bilateral C2-3 foraminal stenoses. 4. C4-C7 ACDF with solid arthrodesis. 5. Motion degraded.     Electronically Signed   By: Tiburcio Pea M.D.   On: 11/11/2023 08:12      I have personally reviewed the images and agree with the above interpretation.  This is quite significantly worse from her last MRI in September of the previous year.   She now has evidence of myelomalacia, likely secondary to a central cord type injury after some of her recent falls.  Medical Decision Making/Assessment and Plan: Sabrina Stanley is a pleasant 63 y.o. female comes into the again today for continued arm and neck pain.  We are previously evaluating for possible cervical spinal cord stimulator, however she has had worsened myelopathy and has had multiple recent falls with progressive hand numbness and decreased ability to use her extremities.  She was recently seen by neurology who diagnosed her with progressive myelopathy.  Is likely she suffered a spinal cord injury after one of her recent falls which caused her acute worsening.  On physical examination she has progressive weakness and progressive myelopathy.  She has decreased utilization of her upper extremities and hands bilaterally.  She gets Lhermitte's phenomenon.  Most recent MRI shows significant worsening at the C3-4 level with adjacent segment disease.  At this point given her progressive cervical myelopathy I do recommend a posterior cervical decompression C3-4 and fusion from C3-C6.  I did discuss the use of BMP given the fact that she is a smoker, unfortunately given her progressive neurologic demise we feel that it would be prudent to take her to surgery regardless of her smoking status.  We discussed the risk and benefits of surgery.  We discussed the risk of C5 palsy as well as worsening of her myelomalacia from a reperfusion type injury.  We discussed that  in the setting of smoking she has decreased chance for fusion and urged her to quit.  Lovenia Kim MD/MSCR Neurosurgery

## 2023-11-24 ENCOUNTER — Other Ambulatory Visit: Payer: Self-pay

## 2023-11-24 ENCOUNTER — Encounter
Admission: RE | Admit: 2023-11-24 | Discharge: 2023-11-24 | Disposition: A | Discharge status: Home / Self Care | Source: Ambulatory Visit | Attending: Neurosurgery | Admitting: Neurosurgery

## 2023-11-24 VITALS — BP 102/57 | HR 65 | Resp 18 | Ht 62.0 in | Wt 144.2 lb

## 2023-11-24 DIAGNOSIS — Z01818 Encounter for other preprocedural examination: Secondary | ICD-10-CM | POA: Diagnosis present

## 2023-11-24 DIAGNOSIS — Z01812 Encounter for preprocedural laboratory examination: Secondary | ICD-10-CM

## 2023-11-24 DIAGNOSIS — I10 Essential (primary) hypertension: Secondary | ICD-10-CM | POA: Insufficient documentation

## 2023-11-24 HISTORY — DX: Nicotine dependence, cigarettes, uncomplicated: F17.210

## 2023-11-24 HISTORY — DX: Dyspnea, unspecified: R06.00

## 2023-11-24 HISTORY — DX: Repeated falls: R29.6

## 2023-11-24 HISTORY — DX: Family history of other specified conditions: Z84.89

## 2023-11-24 HISTORY — DX: Mild cognitive impairment of uncertain or unknown etiology: G31.84

## 2023-11-24 HISTORY — DX: Chronic migraine without aura, intractable, without status migrainosus: G43.719

## 2023-11-24 HISTORY — DX: Atelectasis: J98.11

## 2023-11-24 HISTORY — DX: Chronic cough: R05.3

## 2023-11-24 HISTORY — DX: Gastro-esophageal reflux disease without esophagitis: K21.9

## 2023-11-24 HISTORY — DX: Spondylosis without myelopathy or radiculopathy, cervical region: M47.812

## 2023-11-24 LAB — SURGICAL PCR SCREEN
MRSA, PCR: NEGATIVE
Staphylococcus aureus: NEGATIVE

## 2023-11-24 LAB — TYPE AND SCREEN
ABO/RH(D): O POS
Antibody Screen: NEGATIVE

## 2023-11-24 NOTE — Patient Instructions (Addendum)
 Your procedure is scheduled on: 12/05/23 - Tuesday Report to the Registration Desk on the 1st floor of the [facility]. To find out your arrival time, please call [phone] between 1PM - 3PM on: 12/04/23 - Monday If your arrival time is 6:00 am, do not arrive before that time as the [facility] entrance doors do not open until 6:00 am.  REMEMBER: Instructions that are not followed completely may result in serious medical risk, up to and including death; or upon the discretion of your surgeon and anesthesiologist your surgery may need to be rescheduled.  Do not eat food after midnight the night before surgery.  No gum chewing or hard candies.  You may however, drink CLEAR liquids up to 2 hours before you are scheduled to arrive for your surgery. Do not drink anything within 2 hours of your scheduled arrival time.  Clear liquids include: - water  - apple juice without pulp - gatorade (not RED colors) - black coffee or tea (Do NOT add milk or creamers to the coffee or tea) Do NOT drink anything that is not on this list.  You may continue pain medications as needed,  Anti-inflammatories (NSAIDS) such as Advil, Aleve, Ibuprofen, Motrin, Naproxen, Naprosyn and Aspirin based products such as Excedrin, Goody's Powder, BC Powder.  Stop ANY OVER THE COUNTER supplements until after surgery : Biotin   You may take Tylenol if needed for pain up until the day of surgery.  ON THE DAY OF SURGERY ONLY TAKE THESE MEDICATIONS WITH SIPS OF WATER:  buPROPion (WELLBUTRIN)  esomeprazole (NEXIUM)  FLUoxetine (PROZAC)  Fluticasone-Salmeterol (ADVAIR)  methocarbamol (ROBAXIN)  morphine (MS CONTIN) if needed predniSONE (DELTASONE)  topiramate (TOPAMAX)  albuterol (PROVENTIL) (2.5 MG/3ML) 0.083% nebulizer   Use inhalers albuterol (VENTOLIN HFA)  on the day of surgery and bring to the hospital.  No Alcohol for 24 hours before or after surgery.  No Smoking including e-cigarettes for 24 hours  before surgery.  No chewable tobacco products for at least 6 hours before surgery.  No nicotine patches on the day of surgery.  Do not use any "recreational" drugs for at least a week (preferably 2 weeks) before your surgery.  Please be advised that the combination of cocaine and anesthesia may have negative outcomes, up to and including death. If you test positive for cocaine, your surgery will be cancelled.  On the morning of surgery brush your teeth with toothpaste and water, you may rinse your mouth with mouthwash if you wish. Do not swallow any toothpaste or mouthwash.  Use CHG Soap or wipes as directed on instruction sheet.  Do not wear jewelry, make-up, hairpins, clips or nail polish.  For welded (permanent) jewelry: bracelets, anklets, waist bands, etc.  Please have this removed prior to surgery.  If it is not removed, there is a chance that hospital personnel will need to cut it off on the day of surgery.  Do not wear lotions, powders, or perfumes.   Do not shave body hair from the neck down 48 hours before surgery.  Contact lenses, hearing aids and dentures may not be worn into surgery.  Do not bring valuables to the hospital. [facility] is not responsible for any missing/lost belongings or valuables.   Notify your doctor if there is any change in your medical condition (cold, fever, infection).  Wear comfortable clothing (specific to your surgery type) to the hospital.  After surgery, you can help prevent lung complications by doing breathing exercises.  Take deep  breaths and cough every 1-2 hours. Your doctor may order a device called an Incentive Spirometer to help you take deep breaths. When coughing or sneezing, hold a pillow firmly against your incision with both hands. This is called "splinting." Doing this helps protect your incision. It also decreases belly discomfort.  If you are being admitted to the hospital overnight, leave your suitcase in the car. After  surgery it may be brought to your room.  In case of increased patient census, it may be necessary for you, the patient, to continue your postoperative care in the Same Day Surgery department.  If you are being discharged the day of surgery, you will not be allowed to drive home. You will need a responsible individual to drive you home and stay with you for 24 hours after surgery.   If you are taking public transportation, you will need to have a responsible individual with you.  Please call the Pre-admissions Testing Dept. at [phone] if you have any questions about these instructions.  Surgery Visitation Policy:  Patients having surgery or a procedure may have two visitors.  Children under the age of 66 must have an adult with them who is not the patient.  Inpatient Visitation:    Visiting hours are 7 a.m. to 8 p.m. Up to four visitors are allowed at one time in a patient room. The visitors may rotate out with other people during the day.  One visitor age 14 or older may stay with the patient overnight and must be in the room by 8 p.m.      Pre-operative 5 CHG Bath Instructions   You can play a key role in reducing the risk of infection after surgery. Your skin needs to be as free of germs as possible. You can reduce the number of germs on your skin by washing with CHG (chlorhexidine gluconate) soap before surgery. CHG is an antiseptic soap that kills germs and continues to kill germs even after washing.   DO NOT use if you have an allergy to chlorhexidine/CHG or antibacterial soaps. If your skin becomes reddened or irritated, stop using the CHG and notify one of our RNs at [phone].   Please shower with the CHG soap starting 4 days before surgery using the following schedule:  04/04 - 04/08.    Please keep in mind the following:  DO NOT shave, including legs and underarms, starting the day of your first shower.   You may shave your face at any point before/day of  surgery.  Place clean sheets on your bed the day you start using CHG soap. Use a clean washcloth (not used since being washed) for each shower. DO NOT sleep with pets once you start using the CHG.   CHG Shower Instructions:  If you choose to wash your hair and private area, wash first with your normal shampoo/soap.  After you use shampoo/soap, rinse your hair and body thoroughly to remove shampoo/soap residue.  Turn the water OFF and apply about 3 tablespoons (45 ml) of CHG soap to a CLEAN washcloth.  Apply CHG soap ONLY FROM YOUR NECK DOWN TO YOUR TOES (washing for 3-5 minutes)  DO NOT use CHG soap on face, private areas, open wounds, or sores.  Pay special attention to the area where your surgery is being performed.  If you are having back surgery, having someone wash your back for you may be helpful. Wait 2 minutes after CHG soap is applied, then you may rinse off  the CHG soap.  Pat dry with a clean towel  Put on clean clothes/pajamas   If you choose to wear lotion, please use ONLY the CHG-compatible lotions on the back of this paper.     Additional instructions for the day of surgery: DO NOT APPLY any lotions, deodorants, cologne, or perfumes.   Put on clean/comfortable clothes.  Brush your teeth.  Ask your nurse before applying any prescription medications to the skin.      CHG Compatible Lotions   Aveeno Moisturizing lotion  Cetaphil Moisturizing Cream  Cetaphil Moisturizing Lotion  Clairol Herbal Essence Moisturizing Lotion, Dry Skin  Clairol Herbal Essence Moisturizing Lotion, Extra Dry Skin  Clairol Herbal Essence Moisturizing Lotion, Normal Skin  Curel Age Defying Therapeutic Moisturizing Lotion with Alpha Hydroxy  Curel Extreme Care Body Lotion  Curel Soothing Hands Moisturizing Hand Lotion  Curel Therapeutic Moisturizing Cream, Fragrance-Free  Curel Therapeutic Moisturizing Lotion, Fragrance-Free  Curel Therapeutic Moisturizing Lotion, Original Formula  Eucerin  Daily Replenishing Lotion  Eucerin Dry Skin Therapy Plus Alpha Hydroxy Crme  Eucerin Dry Skin Therapy Plus Alpha Hydroxy Lotion  Eucerin Original Crme  Eucerin Original Lotion  Eucerin Plus Crme Eucerin Plus Lotion  Eucerin TriLipid Replenishing Lotion  Keri Anti-Bacterial Hand Lotion  Keri Deep Conditioning Original Lotion Dry Skin Formula Softly Scented  Keri Deep Conditioning Original Lotion, Fragrance Free Sensitive Skin Formula  Keri Lotion Fast Absorbing Fragrance Free Sensitive Skin Formula  Keri Lotion Fast Absorbing Softly Scented Dry Skin Formula  Keri Original Lotion  Keri Skin Renewal Lotion Keri Silky Smooth Lotion  Keri Silky Smooth Sensitive Skin Lotion  Nivea Body Creamy Conditioning Oil  Nivea Body Extra Enriched Teacher, adult education Moisturizing Lotion Nivea Crme  Nivea Skin Firming Lotion  NutraDerm 30 Skin Lotion  NutraDerm Skin Lotion  NutraDerm Therapeutic Skin Cream  NutraDerm Therapeutic Skin Lotion  ProShield Protective Hand Cream  Provon moisturizing lotion

## 2023-11-29 ENCOUNTER — Ambulatory Visit
Admission: RE | Admit: 2023-11-29 | Discharge: 2023-11-29 | Disposition: A | Discharge status: Home / Self Care | Source: Ambulatory Visit | Attending: Acute Care | Admitting: Acute Care

## 2023-11-29 ENCOUNTER — Telehealth: Payer: Self-pay | Admitting: Neurosurgery

## 2023-11-29 DIAGNOSIS — F17210 Nicotine dependence, cigarettes, uncomplicated: Secondary | ICD-10-CM | POA: Diagnosis present

## 2023-11-29 DIAGNOSIS — Z122 Encounter for screening for malignant neoplasm of respiratory organs: Secondary | ICD-10-CM | POA: Insufficient documentation

## 2023-11-29 DIAGNOSIS — Z87891 Personal history of nicotine dependence: Secondary | ICD-10-CM | POA: Insufficient documentation

## 2023-11-29 NOTE — Telephone Encounter (Signed)
 Patient stopped by the office to ask if Dr. Katrinka Blazing would still perform her surgery with having a fractured wrist? She has a brace on and wants to know if she will wear the brace into the OR or will they put the brace back on after surgery. Please advise.

## 2023-12-05 ENCOUNTER — Inpatient Hospital Stay
Admission: RE | Admit: 2023-12-05 | Discharge: 2023-12-07 | DRG: 472 | Disposition: A | Discharge status: Home Health | Attending: Neurosurgery | Admitting: Neurosurgery

## 2023-12-05 ENCOUNTER — Inpatient Hospital Stay

## 2023-12-05 ENCOUNTER — Inpatient Hospital Stay: Admitting: Anesthesiology

## 2023-12-05 ENCOUNTER — Encounter: Payer: Self-pay | Admitting: Neurosurgery

## 2023-12-05 ENCOUNTER — Inpatient Hospital Stay: Payer: Self-pay | Admitting: Urgent Care

## 2023-12-05 ENCOUNTER — Encounter
Admission: RE | Disposition: A | Discharge status: Home Health | Payer: Self-pay | Source: Home / Self Care | Attending: Neurosurgery

## 2023-12-05 ENCOUNTER — Other Ambulatory Visit: Payer: Self-pay

## 2023-12-05 DIAGNOSIS — Z8616 Personal history of COVID-19: Secondary | ICD-10-CM

## 2023-12-05 DIAGNOSIS — I739 Peripheral vascular disease, unspecified: Secondary | ICD-10-CM | POA: Diagnosis present

## 2023-12-05 DIAGNOSIS — Z79899 Other long term (current) drug therapy: Secondary | ICD-10-CM

## 2023-12-05 DIAGNOSIS — Z981 Arthrodesis status: Principal | ICD-10-CM

## 2023-12-05 DIAGNOSIS — Z90710 Acquired absence of both cervix and uterus: Secondary | ICD-10-CM | POA: Diagnosis not present

## 2023-12-05 DIAGNOSIS — M4802 Spinal stenosis, cervical region: Principal | ICD-10-CM | POA: Diagnosis present

## 2023-12-05 DIAGNOSIS — Z818 Family history of other mental and behavioral disorders: Secondary | ICD-10-CM | POA: Diagnosis not present

## 2023-12-05 DIAGNOSIS — Z801 Family history of malignant neoplasm of trachea, bronchus and lung: Secondary | ICD-10-CM | POA: Diagnosis not present

## 2023-12-05 DIAGNOSIS — J449 Chronic obstructive pulmonary disease, unspecified: Secondary | ICD-10-CM | POA: Diagnosis present

## 2023-12-05 DIAGNOSIS — K219 Gastro-esophageal reflux disease without esophagitis: Secondary | ICD-10-CM | POA: Diagnosis present

## 2023-12-05 DIAGNOSIS — M542 Cervicalgia: Secondary | ICD-10-CM | POA: Diagnosis present

## 2023-12-05 DIAGNOSIS — M5031 Other cervical disc degeneration,  high cervical region: Secondary | ICD-10-CM | POA: Diagnosis not present

## 2023-12-05 DIAGNOSIS — Z881 Allergy status to other antibiotic agents status: Secondary | ICD-10-CM

## 2023-12-05 DIAGNOSIS — M4712 Other spondylosis with myelopathy, cervical region: Secondary | ICD-10-CM | POA: Diagnosis present

## 2023-12-05 DIAGNOSIS — E559 Vitamin D deficiency, unspecified: Secondary | ICD-10-CM | POA: Diagnosis present

## 2023-12-05 DIAGNOSIS — M797 Fibromyalgia: Secondary | ICD-10-CM | POA: Diagnosis present

## 2023-12-05 DIAGNOSIS — Z8049 Family history of malignant neoplasm of other genital organs: Secondary | ICD-10-CM | POA: Diagnosis not present

## 2023-12-05 DIAGNOSIS — I10 Essential (primary) hypertension: Secondary | ICD-10-CM | POA: Diagnosis present

## 2023-12-05 DIAGNOSIS — E039 Hypothyroidism, unspecified: Secondary | ICD-10-CM | POA: Diagnosis present

## 2023-12-05 DIAGNOSIS — F319 Bipolar disorder, unspecified: Secondary | ICD-10-CM | POA: Diagnosis present

## 2023-12-05 DIAGNOSIS — Z8249 Family history of ischemic heart disease and other diseases of the circulatory system: Secondary | ICD-10-CM

## 2023-12-05 DIAGNOSIS — Z888 Allergy status to other drugs, medicaments and biological substances status: Secondary | ICD-10-CM

## 2023-12-05 DIAGNOSIS — F17210 Nicotine dependence, cigarettes, uncomplicated: Secondary | ICD-10-CM | POA: Diagnosis present

## 2023-12-05 DIAGNOSIS — Z7951 Long term (current) use of inhaled steroids: Secondary | ICD-10-CM | POA: Diagnosis not present

## 2023-12-05 DIAGNOSIS — Z880 Allergy status to penicillin: Secondary | ICD-10-CM

## 2023-12-05 DIAGNOSIS — Z886 Allergy status to analgesic agent status: Secondary | ICD-10-CM

## 2023-12-05 DIAGNOSIS — E785 Hyperlipidemia, unspecified: Secondary | ICD-10-CM | POA: Diagnosis present

## 2023-12-05 DIAGNOSIS — Z803 Family history of malignant neoplasm of breast: Secondary | ICD-10-CM | POA: Diagnosis not present

## 2023-12-05 DIAGNOSIS — R29898 Other symptoms and signs involving the musculoskeletal system: Secondary | ICD-10-CM | POA: Diagnosis present

## 2023-12-05 DIAGNOSIS — G959 Disease of spinal cord, unspecified: Principal | ICD-10-CM | POA: Diagnosis present

## 2023-12-05 DIAGNOSIS — Z885 Allergy status to narcotic agent status: Secondary | ICD-10-CM

## 2023-12-05 DIAGNOSIS — M62838 Other muscle spasm: Secondary | ICD-10-CM

## 2023-12-05 DIAGNOSIS — Z01818 Encounter for other preprocedural examination: Secondary | ICD-10-CM

## 2023-12-05 DIAGNOSIS — Z7952 Long term (current) use of systemic steroids: Secondary | ICD-10-CM

## 2023-12-05 HISTORY — PX: POSTERIOR CERVICAL LAMINECTOMY: SHX2248

## 2023-12-05 HISTORY — PX: POSTERIOR CERVICAL FUSION/FORAMINOTOMY: SHX5038

## 2023-12-05 LAB — ABO/RH: ABO/RH(D): O POS

## 2023-12-05 SURGERY — POSTERIOR CERVICAL FUSION/FORAMINOTOMY LEVEL 3
Anesthesia: General

## 2023-12-05 MED ORDER — BISACODYL 5 MG PO TBEC: 5.0000 mg | DELAYED_RELEASE_TABLET | Freq: Every day | ORAL | Status: DC | PRN

## 2023-12-05 MED ORDER — SENNOSIDES-DOCUSATE SODIUM 8.6-50 MG PO TABS: 1.0000 | ORAL_TABLET | Freq: Every evening | ORAL | Status: DC | PRN

## 2023-12-05 MED ORDER — HYDROMORPHONE HCL 1 MG/ML IJ SOLN
INTRAMUSCULAR | Status: AC
  Filled 2023-12-05: qty 1

## 2023-12-05 MED ORDER — FLUTICASONE FUROATE-VILANTEROL 100-25 MCG/ACT IN AEPB
1.0000 | INHALATION_SPRAY | Freq: Every day | RESPIRATORY_TRACT | Status: DC
Start: 2023-12-05 — End: 2023-12-07
  Administered 2023-12-06: 1 via RESPIRATORY_TRACT
  Filled 2023-12-05: qty 28

## 2023-12-05 MED ORDER — LIDOCAINE HCL (CARDIAC) PF 100 MG/5ML IV SOSY
PREFILLED_SYRINGE | INTRAVENOUS | Status: DC | PRN
  Administered 2023-12-05: 100 mg via INTRAVENOUS

## 2023-12-05 MED ORDER — BISOPROLOL-HYDROCHLOROTHIAZIDE 2.5-6.25 MG PO TABS
1.0000 | ORAL_TABLET | Freq: Every day | ORAL | Status: DC
  Filled 2023-12-05: qty 1

## 2023-12-05 MED ORDER — HYDROMORPHONE HCL 1 MG/ML IJ SOLN
1.0000 mg | INTRAMUSCULAR | Status: DC | PRN
  Administered 2023-12-05 (×2): 1 mg via INTRAVENOUS

## 2023-12-05 MED ORDER — OXYCODONE HCL 5 MG/5ML PO SOLN: 5.0000 mg | Freq: Once | ORAL | Status: DC | PRN

## 2023-12-05 MED ORDER — SODIUM CHLORIDE (PF) 0.9 % IJ SOLN
INTRAMUSCULAR | Status: AC
  Filled 2023-12-05: qty 20

## 2023-12-05 MED ORDER — PROPOFOL 1000 MG/100ML IV EMUL
INTRAVENOUS | Status: AC
  Filled 2023-12-05: qty 100

## 2023-12-05 MED ORDER — CEFAZOLIN SODIUM-DEXTROSE 2-4 GM/100ML-% IV SOLN
INTRAVENOUS | Status: AC
  Filled 2023-12-05: qty 100

## 2023-12-05 MED ORDER — MAGNESIUM CITRATE PO SOLN: 1.0000 | Freq: Once | ORAL | Status: DC | PRN

## 2023-12-05 MED ORDER — ONDANSETRON HCL 4 MG PO TABS: 4.0000 mg | ORAL_TABLET | Freq: Four times a day (QID) | ORAL | Status: DC | PRN

## 2023-12-05 MED ORDER — KETAMINE HCL 50 MG/5ML IJ SOSY
PREFILLED_SYRINGE | INTRAMUSCULAR | Status: DC | PRN
  Administered 2023-12-05: 20 mg via INTRAVENOUS
  Administered 2023-12-05: 10 mg via INTRAVENOUS
  Administered 2023-12-05: 20 mg via INTRAVENOUS

## 2023-12-05 MED ORDER — 0.9 % SODIUM CHLORIDE (POUR BTL) OPTIME
TOPICAL | Status: DC | PRN
Start: 2023-12-05 — End: 2023-12-05
  Administered 2023-12-05: 500 mL

## 2023-12-05 MED ORDER — PRIMIDONE 50 MG PO TABS
50.0000 mg | ORAL_TABLET | Freq: Every day | ORAL | Status: DC
  Administered 2023-12-05 – 2023-12-06 (×2): 50 mg via ORAL
  Filled 2023-12-05 (×2): qty 1

## 2023-12-05 MED ORDER — VITAMIN D3 25 MCG (1000 UNIT) PO TABS
1000.0000 [IU] | ORAL_TABLET | Freq: Every day | ORAL | Status: DC
  Administered 2023-12-05 – 2023-12-07 (×3): 1000 [IU] via ORAL
  Filled 2023-12-05 (×6): qty 1

## 2023-12-05 MED ORDER — SODIUM CHLORIDE 0.9 % IV SOLN: 250.0000 mL | INTRAVENOUS | Status: AC

## 2023-12-05 MED ORDER — FENTANYL CITRATE (PF) 100 MCG/2ML IJ SOLN
INTRAMUSCULAR | Status: DC | PRN
  Administered 2023-12-05 (×2): 50 ug via INTRAVENOUS

## 2023-12-05 MED ORDER — ACETAMINOPHEN 10 MG/ML IV SOLN
INTRAVENOUS | Status: DC | PRN
  Administered 2023-12-05: 1000 mg via INTRAVENOUS

## 2023-12-05 MED ORDER — BUPIVACAINE-EPINEPHRINE (PF) 0.5% -1:200000 IJ SOLN
INTRAMUSCULAR | Status: DC | PRN
  Administered 2023-12-05: 10 mL

## 2023-12-05 MED ORDER — ACETAMINOPHEN 10 MG/ML IV SOLN: 1000.0000 mg | Freq: Once | INTRAVENOUS | Status: DC | PRN

## 2023-12-05 MED ORDER — MENTHOL 3 MG MT LOZG: 1.0000 | LOZENGE | OROMUCOSAL | Status: DC | PRN

## 2023-12-05 MED ORDER — TOPIRAMATE 100 MG PO TABS: 100.0000 mg | ORAL_TABLET | Freq: Two times a day (BID) | ORAL | Status: DC

## 2023-12-05 MED ORDER — OXYCODONE HCL 5 MG PO TABS
ORAL_TABLET | ORAL | Status: AC
  Filled 2023-12-05: qty 2

## 2023-12-05 MED ORDER — SODIUM CHLORIDE 0.9 % IV SOLN
INTRAVENOUS | Status: DC | PRN
  Administered 2023-12-05: .1 ug/kg/min via INTRAVENOUS

## 2023-12-05 MED ORDER — CEFAZOLIN SODIUM-DEXTROSE 2-4 GM/100ML-% IV SOLN
2.0000 g | Freq: Four times a day (QID) | INTRAVENOUS | Status: AC
  Administered 2023-12-05 (×2): 2 g via INTRAVENOUS
  Filled 2023-12-05 (×2): qty 100

## 2023-12-05 MED ORDER — SODIUM CHLORIDE 0.9% FLUSH: 3.0000 mL | INTRAVENOUS | Status: DC | PRN

## 2023-12-05 MED ORDER — ACETAMINOPHEN 500 MG PO TABS
1000.0000 mg | ORAL_TABLET | Freq: Four times a day (QID) | ORAL | Status: AC
  Administered 2023-12-05 – 2023-12-06 (×4): 1000 mg via ORAL
  Filled 2023-12-05 (×4): qty 2

## 2023-12-05 MED ORDER — METHOCARBAMOL 1000 MG/10ML IJ SOLN
500.0000 mg | Freq: Four times a day (QID) | INTRAMUSCULAR | Status: DC | PRN
  Administered 2023-12-05: 500 mg via INTRAVENOUS

## 2023-12-05 MED ORDER — VITAMIN D (ERGOCALCIFEROL) 1.25 MG (50000 UNIT) PO CAPS: 50000.0000 [IU] | ORAL_CAPSULE | ORAL | Status: DC

## 2023-12-05 MED ORDER — TRIAMCINOLONE ACETONIDE 0.1 % EX CREA: 1.0000 | TOPICAL_CREAM | CUTANEOUS | Status: DC

## 2023-12-05 MED ORDER — HYDROMORPHONE HCL 1 MG/ML IJ SOLN: 1.0000 mg | INTRAMUSCULAR | Status: DC | PRN

## 2023-12-05 MED ORDER — PREDNISONE 10 MG PO TABS
5.0000 mg | ORAL_TABLET | Freq: Every day | ORAL | Status: DC
  Administered 2023-12-05 – 2023-12-07 (×3): 5 mg via ORAL
  Filled 2023-12-05 (×4): qty 1

## 2023-12-05 MED ORDER — MIDAZOLAM HCL 2 MG/2ML IJ SOLN
INTRAMUSCULAR | Status: DC | PRN
  Administered 2023-12-05: 2 mg via INTRAVENOUS

## 2023-12-05 MED ORDER — ACETAMINOPHEN 10 MG/ML IV SOLN
INTRAVENOUS | Status: AC
  Filled 2023-12-05: qty 100

## 2023-12-05 MED ORDER — NALOXONE HCL 4 MG/0.1ML NA LIQD
0.4000 mg | Freq: Once | NASAL | Status: DC
  Filled 2023-12-05: qty 8

## 2023-12-05 MED ORDER — FLUOXETINE HCL 20 MG PO CAPS
60.0000 mg | ORAL_CAPSULE | Freq: Every day | ORAL | Status: DC
  Administered 2023-12-06 – 2023-12-07 (×2): 60 mg via ORAL
  Filled 2023-12-05 (×2): qty 3

## 2023-12-05 MED ORDER — PROPOFOL 10 MG/ML IV BOLUS
INTRAVENOUS | Status: DC | PRN
  Administered 2023-12-05: 50 mg via INTRAVENOUS
  Administered 2023-12-05: 80 mg via INTRAVENOUS
  Administered 2023-12-05: 150 ug/kg/min via INTRAVENOUS

## 2023-12-05 MED ORDER — ENOXAPARIN SODIUM 40 MG/0.4ML IJ SOSY
40.0000 mg | PREFILLED_SYRINGE | INTRAMUSCULAR | Status: DC
  Administered 2023-12-06 – 2023-12-07 (×2): 40 mg via SUBCUTANEOUS
  Filled 2023-12-05 (×2): qty 0.4

## 2023-12-05 MED ORDER — SENNA 8.6 MG PO TABS
1.0000 | ORAL_TABLET | Freq: Two times a day (BID) | ORAL | Status: DC
  Administered 2023-12-05 – 2023-12-07 (×4): 8.6 mg via ORAL
  Filled 2023-12-05 (×4): qty 1

## 2023-12-05 MED ORDER — DIVALPROEX SODIUM ER 500 MG PO TB24
750.0000 mg | ORAL_TABLET | Freq: Every day | ORAL | Status: DC
  Administered 2023-12-05 – 2023-12-06 (×2): 750 mg via ORAL
  Filled 2023-12-05 (×2): qty 1

## 2023-12-05 MED ORDER — MONTELUKAST SODIUM 10 MG PO TABS
10.0000 mg | ORAL_TABLET | Freq: Every day | ORAL | Status: DC
  Administered 2023-12-05 – 2023-12-07 (×3): 10 mg via ORAL
  Filled 2023-12-05 (×4): qty 1

## 2023-12-05 MED ORDER — ACETAMINOPHEN 650 MG RE SUPP
650.0000 mg | RECTAL | Status: DC | PRN
Start: 2023-12-05 — End: 2023-12-07

## 2023-12-05 MED ORDER — OXYCODONE HCL 5 MG PO TABS
15.0000 mg | ORAL_TABLET | ORAL | Status: DC | PRN
  Administered 2023-12-05 – 2023-12-07 (×7): 15 mg via ORAL
  Filled 2023-12-05 (×7): qty 3

## 2023-12-05 MED ORDER — CEFAZOLIN IN SODIUM CHLORIDE 2-0.9 GM/100ML-% IV SOLN
2.0000 g | Freq: Once | INTRAVENOUS | Status: DC
  Filled 2023-12-05: qty 100

## 2023-12-05 MED ORDER — PHENOL 1.4 % MT LIQD: 1.0000 | OROMUCOSAL | Status: DC | PRN

## 2023-12-05 MED ORDER — PANTOPRAZOLE SODIUM 40 MG PO TBEC
80.0000 mg | DELAYED_RELEASE_TABLET | Freq: Every day | ORAL | Status: DC
  Administered 2023-12-06 – 2023-12-07 (×2): 80 mg via ORAL
  Filled 2023-12-05 (×2): qty 2

## 2023-12-05 MED ORDER — LACTATED RINGERS IV SOLN: INTRAVENOUS | Status: DC | PRN

## 2023-12-05 MED ORDER — MIDAZOLAM HCL 2 MG/2ML IJ SOLN
INTRAMUSCULAR | Status: AC
  Filled 2023-12-05: qty 2

## 2023-12-05 MED ORDER — FENTANYL CITRATE (PF) 100 MCG/2ML IJ SOLN: 25.0000 ug | INTRAMUSCULAR | Status: DC | PRN

## 2023-12-05 MED ORDER — METHOCARBAMOL 500 MG PO TABS: 500.0000 mg | ORAL_TABLET | Freq: Four times a day (QID) | ORAL | Status: DC

## 2023-12-05 MED ORDER — BUPIVACAINE LIPOSOME 1.3 % IJ SUSP
INTRAMUSCULAR | Status: AC
  Filled 2023-12-05: qty 20

## 2023-12-05 MED ORDER — SUCCINYLCHOLINE CHLORIDE 200 MG/10ML IV SOSY
PREFILLED_SYRINGE | INTRAVENOUS | Status: DC | PRN
  Administered 2023-12-05: 60 mg via INTRAVENOUS

## 2023-12-05 MED ORDER — DEXMEDETOMIDINE HCL IN NACL 80 MCG/20ML IV SOLN
INTRAVENOUS | Status: DC | PRN
  Administered 2023-12-05 (×2): 4 ug via INTRAVENOUS
  Administered 2023-12-05: 8 ug via INTRAVENOUS

## 2023-12-05 MED ORDER — DEXAMETHASONE SODIUM PHOSPHATE 10 MG/ML IJ SOLN
INTRAMUSCULAR | Status: DC | PRN
  Administered 2023-12-05: 10 mg via INTRAVENOUS

## 2023-12-05 MED ORDER — KETAMINE HCL 50 MG/5ML IJ SOSY
PREFILLED_SYRINGE | INTRAMUSCULAR | Status: AC
  Filled 2023-12-05: qty 5

## 2023-12-05 MED ORDER — SURGIFLO WITH THROMBIN (HEMOSTATIC MATRIX KIT) OPTIME
TOPICAL | Status: DC | PRN
  Administered 2023-12-05: 1 via TOPICAL

## 2023-12-05 MED ORDER — VANCOMYCIN HCL 1 G IV SOLR
INTRAVENOUS | Status: DC | PRN
  Administered 2023-12-05: 1000 mg via TOPICAL

## 2023-12-05 MED ORDER — SODIUM CHLORIDE 0.9% FLUSH
3.0000 mL | Freq: Two times a day (BID) | INTRAVENOUS | Status: DC
  Administered 2023-12-06 – 2023-12-07 (×3): 3 mL via INTRAVENOUS

## 2023-12-05 MED ORDER — BISOPROLOL FUMARATE 5 MG PO TABS
2.5000 mg | ORAL_TABLET | Freq: Every day | ORAL | Status: DC
  Administered 2023-12-06 – 2023-12-07 (×2): 2.5 mg via ORAL
  Filled 2023-12-05 (×2): qty 0.5

## 2023-12-05 MED ORDER — ARTIFICIAL TEARS OPHTHALMIC OINT
TOPICAL_OINTMENT | OPHTHALMIC | Status: AC
  Filled 2023-12-05: qty 3.5

## 2023-12-05 MED ORDER — GLYCOPYRROLATE 0.2 MG/ML IJ SOLN
INTRAMUSCULAR | Status: DC | PRN
  Administered 2023-12-05: .2 mg via INTRAVENOUS

## 2023-12-05 MED ORDER — ROPINIROLE HCL 1 MG PO TABS
1.0000 mg | ORAL_TABLET | Freq: Every day | ORAL | Status: DC
  Administered 2023-12-05 – 2023-12-06 (×2): 1 mg via ORAL
  Filled 2023-12-05 (×2): qty 1

## 2023-12-05 MED ORDER — LACTATED RINGERS IV SOLN: INTRAVENOUS | Status: DC

## 2023-12-05 MED ORDER — BUPIVACAINE HCL (PF) 0.5 % IJ SOLN
INTRAMUSCULAR | Status: AC
  Filled 2023-12-05: qty 30

## 2023-12-05 MED ORDER — BUPROPION HCL 100 MG PO TABS
100.0000 mg | ORAL_TABLET | Freq: Every day | ORAL | Status: DC
  Administered 2023-12-06 – 2023-12-07 (×2): 100 mg via ORAL
  Filled 2023-12-05 (×2): qty 1

## 2023-12-05 MED ORDER — OXYCODONE HCL 5 MG PO TABS
10.0000 mg | ORAL_TABLET | ORAL | Status: DC | PRN
  Administered 2023-12-05 – 2023-12-06 (×3): 10 mg via ORAL
  Filled 2023-12-05 (×2): qty 2

## 2023-12-05 MED ORDER — ONDANSETRON HCL 4 MG/2ML IJ SOLN: 4.0000 mg | Freq: Four times a day (QID) | INTRAMUSCULAR | Status: DC | PRN

## 2023-12-05 MED ORDER — METHOCARBAMOL 1000 MG/10ML IJ SOLN
INTRAMUSCULAR | Status: AC
  Filled 2023-12-05: qty 10

## 2023-12-05 MED ORDER — ATORVASTATIN CALCIUM 10 MG PO TABS
10.0000 mg | ORAL_TABLET | Freq: Every day | ORAL | Status: DC
  Administered 2023-12-05 – 2023-12-07 (×3): 10 mg via ORAL
  Filled 2023-12-05 (×3): qty 1

## 2023-12-05 MED ORDER — IRRISEPT - 450ML BOTTLE WITH 0.05% CHG IN STERILE WATER, USP 99.95% OPTIME
TOPICAL | Status: DC | PRN
  Administered 2023-12-05: 450 mL

## 2023-12-05 MED ORDER — ORAL CARE MOUTH RINSE: 15.0000 mL | Freq: Once | OROMUCOSAL | Status: AC

## 2023-12-05 MED ORDER — METHOCARBAMOL 500 MG PO TABS
500.0000 mg | ORAL_TABLET | Freq: Four times a day (QID) | ORAL | Status: DC | PRN
  Administered 2023-12-05 – 2023-12-07 (×5): 500 mg via ORAL
  Filled 2023-12-05 (×7): qty 1

## 2023-12-05 MED ORDER — ONDANSETRON HCL 4 MG/2ML IJ SOLN: 4.0000 mg | Freq: Once | INTRAMUSCULAR | Status: DC | PRN

## 2023-12-05 MED ORDER — FENTANYL CITRATE (PF) 100 MCG/2ML IJ SOLN
INTRAMUSCULAR | Status: AC
  Filled 2023-12-05: qty 2

## 2023-12-05 MED ORDER — CEFAZOLIN SODIUM-DEXTROSE 2-4 GM/100ML-% IV SOLN
2.0000 g | INTRAVENOUS | Status: AC
  Administered 2023-12-05: 2 g via INTRAVENOUS

## 2023-12-05 MED ORDER — MORPHINE SULFATE ER 15 MG PO TBCR: 15.0000 mg | EXTENDED_RELEASE_TABLET | Freq: Three times a day (TID) | ORAL | Status: DC | PRN

## 2023-12-05 MED ORDER — VANCOMYCIN HCL 1000 MG IV SOLR
INTRAVENOUS | Status: AC
  Filled 2023-12-05: qty 20

## 2023-12-05 MED ORDER — FUROSEMIDE 20 MG PO TABS: 20.0000 mg | ORAL_TABLET | ORAL | Status: DC | PRN

## 2023-12-05 MED ORDER — REMIFENTANIL HCL 1 MG IV SOLR
INTRAVENOUS | Status: AC
  Filled 2023-12-05: qty 1000

## 2023-12-05 MED ORDER — POTASSIUM CHLORIDE CRYS ER 20 MEQ PO TBCR: 20.0000 meq | EXTENDED_RELEASE_TABLET | Freq: Every day | ORAL | Status: DC | PRN

## 2023-12-05 MED ORDER — ACETAMINOPHEN 325 MG PO TABS: 650.0000 mg | ORAL_TABLET | ORAL | Status: DC | PRN

## 2023-12-05 MED ORDER — ALBUTEROL SULFATE (2.5 MG/3ML) 0.083% IN NEBU: 2.5000 mg | INHALATION_SOLUTION | Freq: Four times a day (QID) | RESPIRATORY_TRACT | Status: DC | PRN

## 2023-12-05 MED ORDER — HYDROCHLOROTHIAZIDE 12.5 MG PO TABS
6.2500 mg | ORAL_TABLET | Freq: Every day | ORAL | Status: DC
  Administered 2023-12-06 – 2023-12-07 (×2): 6.25 mg via ORAL
  Filled 2023-12-05 (×2): qty 1

## 2023-12-05 MED ORDER — CHLORHEXIDINE GLUCONATE 0.12 % MT SOLN
OROMUCOSAL | Status: AC
  Filled 2023-12-05: qty 15

## 2023-12-05 MED ORDER — BUPIVACAINE HCL (PF) 0.5 % IJ SOLN
INTRAMUSCULAR | Status: DC | PRN
  Administered 2023-12-05: 40 mL via INTRAMUSCULAR

## 2023-12-05 MED ORDER — SUMATRIPTAN SUCCINATE 50 MG PO TABS: 100.0000 mg | ORAL_TABLET | ORAL | Status: DC | PRN

## 2023-12-05 MED ORDER — ALBUTEROL SULFATE HFA 108 (90 BASE) MCG/ACT IN AERS: 2.0000 | INHALATION_SPRAY | Freq: Four times a day (QID) | RESPIRATORY_TRACT | Status: DC | PRN

## 2023-12-05 MED ORDER — DOCUSATE SODIUM 100 MG PO CAPS
100.0000 mg | ORAL_CAPSULE | Freq: Two times a day (BID) | ORAL | Status: DC
  Administered 2023-12-05 – 2023-12-07 (×4): 100 mg via ORAL
  Filled 2023-12-05 (×4): qty 1

## 2023-12-05 MED ORDER — FAMOTIDINE 20 MG PO TABS
40.0000 mg | ORAL_TABLET | Freq: Every day | ORAL | Status: DC
  Administered 2023-12-05 – 2023-12-06 (×2): 40 mg via ORAL
  Filled 2023-12-05 (×2): qty 2

## 2023-12-05 MED ORDER — PHENYLEPHRINE 80 MCG/ML (10ML) SYRINGE FOR IV PUSH (FOR BLOOD PRESSURE SUPPORT)
PREFILLED_SYRINGE | INTRAVENOUS | Status: DC | PRN
  Administered 2023-12-05: 200 ug via INTRAVENOUS

## 2023-12-05 MED ORDER — BIOTIN 10000 MCG PO TABS: 1.0000 | ORAL_TABLET | Freq: Every day | ORAL | Status: DC

## 2023-12-05 MED ORDER — ONDANSETRON HCL 4 MG/2ML IJ SOLN
INTRAMUSCULAR | Status: DC | PRN
  Administered 2023-12-05: 4 mg via INTRAVENOUS

## 2023-12-05 MED ORDER — CHLORHEXIDINE GLUCONATE 0.12 % MT SOLN
15.0000 mL | Freq: Once | OROMUCOSAL | Status: AC
  Administered 2023-12-05: 15 mL via OROMUCOSAL

## 2023-12-05 MED ORDER — CYANOCOBALAMIN 1000 MCG/ML IJ SOLN: 1000.0000 ug | INTRAMUSCULAR | Status: DC

## 2023-12-05 MED ORDER — OXYCODONE HCL 5 MG PO TABS: 5.0000 mg | ORAL_TABLET | Freq: Once | ORAL | Status: DC | PRN

## 2023-12-05 SURGICAL SUPPLY — 48 items
ALLOGRAFT BONE FIBER KORE 5 (Bone Implant) IMPLANT
BASIN KIT SINGLE STR (MISCELLANEOUS) ×1 IMPLANT
BIT DRILL NS LF DISP RELINE C (BIT) IMPLANT
BIT DRL NS LF DISP RELINE C (BIT) ×1 IMPLANT
BRUSH SCRUB EZ 4% CHG (MISCELLANEOUS) ×1 IMPLANT
BUR NEURO DRILL SOFT 3.0X3.8M (BURR) ×1 IMPLANT
DERMABOND ADVANCED .7 DNX12 (GAUZE/BANDAGES/DRESSINGS) ×1 IMPLANT
DRAPE C-ARM XRAY 36X54 (DRAPES) ×2 IMPLANT
DRAPE LAPAROTOMY 77X122 PED (DRAPES) ×2 IMPLANT
DRSG OPSITE POSTOP 3X4 (GAUZE/BANDAGES/DRESSINGS) ×2 IMPLANT
ELECT EZSTD 165MM 6.5IN (MISCELLANEOUS) ×1 IMPLANT
ELECTRODE EZSTD 165MM 6.5IN (MISCELLANEOUS) ×1 IMPLANT
EVACUATOR 1/8 PVC DRAIN (DRAIN) IMPLANT
FEE INTRAOP CADWELL SUPPLY NCS (MISCELLANEOUS) IMPLANT
FEE INTRAOP MONITOR IMPULS NCS (MISCELLANEOUS) IMPLANT
GLOVE BIOGEL PI IND STRL 7.0 (GLOVE) ×1 IMPLANT
GLOVE BIOGEL PI IND STRL 8 (GLOVE) ×2 IMPLANT
GLOVE SURG SYN 7.0 (GLOVE) ×1 IMPLANT
GLOVE SURG SYN 7.0 PF PI (GLOVE) ×1 IMPLANT
GLOVE SURG SYN 7.5 E (GLOVE) ×2 IMPLANT
GLOVE SURG SYN 7.5 PF PI (GLOVE) ×2 IMPLANT
GOWN SRG LRG LVL 4 IMPRV REINF (GOWNS) ×1 IMPLANT
GOWN SRG XL LVL 3 NONREINFORCE (GOWNS) ×1 IMPLANT
HOLDER FOLEY CATH W/STRAP (MISCELLANEOUS) IMPLANT
INTRAOP CADWELL SUPPLY FEE NCS (MISCELLANEOUS) ×1 IMPLANT
INTRAOP MONITOR FEE IMPULS NCS (MISCELLANEOUS) ×1 IMPLANT
JET LAVAGE IRRISEPT WOUND (IRRIGATION / IRRIGATOR) ×1 IMPLANT
KIT PREVENA INCISION MGT 13 (CANNISTER) IMPLANT
LAVAGE JET IRRISEPT WOUND (IRRIGATION / IRRIGATOR) IMPLANT
MANIFOLD NEPTUNE II (INSTRUMENTS) ×1 IMPLANT
MARKER SKIN DUAL TIP RULER LAB (MISCELLANEOUS) ×1 IMPLANT
NS IRRIG 1000ML POUR BTL (IV SOLUTION) ×1 IMPLANT
PACK LAMINECTOMY ARMC (PACKS) ×1 IMPLANT
PAD ARMBOARD POSITIONER FOAM (MISCELLANEOUS) ×1 IMPLANT
ROD RELINE C PB 3.5 X50 (Screw) IMPLANT
SCREW LOCK RELINE C OPEN (Screw) IMPLANT
SCREW SP MA RELINE-C 3.5X12 (Screw) IMPLANT
SCREW SPINAL RELINE C 3.5X12 (Screw) ×8 IMPLANT
STAPLER SKIN PROX 35W (STAPLE) ×1 IMPLANT
SURGIFLO W/THROMBIN 8M KIT (HEMOSTASIS) ×1 IMPLANT
SUT ETHILON 3-0 FS-10 30 BLK (SUTURE) ×1 IMPLANT
SUT VIC AB 0 CT1 27XCR 8 STRN (SUTURE) ×3 IMPLANT
SUT VIC AB 2-0 CT1 18 (SUTURE) ×2 IMPLANT
SUTURE EHLN 3-0 FS-10 30 BLK (SUTURE) IMPLANT
SYR 30ML LL (SYRINGE) ×2 IMPLANT
TAPE CLOTH 3X10 WHT NS LF (GAUZE/BANDAGES/DRESSINGS) ×2 IMPLANT
TRAP FLUID SMOKE EVACUATOR (MISCELLANEOUS) ×2 IMPLANT
TRAY FOLEY SLVR 16FR LF STAT (SET/KITS/TRAYS/PACK) IMPLANT

## 2023-12-05 NOTE — Transfer of Care (Signed)
 Immediate Anesthesia Transfer of Care Note  Patient: Sabrina Stanley  Procedure(s) Performed: POSTERIOR CERVICAL FUSION/FORAMINOTOMY LEVEL 3 POSTERIOR CERVICAL LAMINECTOMY  Patient Location: PACU  Anesthesia Type:General  Level of Consciousness: awake and patient cooperative  Airway & Oxygen Therapy: Patient Spontanous Breathing and Patient connected to face mask oxygen  Post-op Assessment: Report given to RN and Post -op Vital signs reviewed and stable  Post vital signs: stable  Last Vitals:  Vitals Value Taken Time  BP 121/92 12/05/23 1248  Temp    Pulse 78 12/05/23 1251  Resp 17 12/05/23 1251  SpO2 100 % 12/05/23 1251  Vitals shown include unfiled device data.  Last Pain:  Vitals:   12/05/23 0815  TempSrc: Temporal  PainSc: 7          Complications: No notable events documented.

## 2023-12-05 NOTE — Op Note (Signed)
 Indications: Sabrina Stanley is a 62 y.o. female with history of a previous anterior cervical discectomy and fusion who presented with progressive cervical myelopathy.  Sabrina Stanley was found to have adjacent level disease with severe stenosis and T2 signal change noted in her cervical spinal cord given her progressive myelopathy and worsening disability and hand function Sabrina Stanley was taken to Sabrina operating room for posterior cervical decompression from C3-4 and posterior cervical fusion from C3-C6.  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   Preoperative Note:   Risks of surgery discussed include: infection, bleeding, stroke, coma, death, paralysis, CSF leak, nerve/spinal cord injury, numbness, tingling, weakness, complex regional pain syndrome, recurrent stenosis and/or disc herniation, vascular injury, development of instability, neck/back pain, need for further surgery, persistent symptoms, development of deformity, and Sabrina risks of anesthesia. Sabrina Stanley understood these risks and agreed to proceed.  Operative Note:   OPERATIVE PROCEDURE:  1. Posterior Segmental Instrumentation C3-6 using Nuvasive Reline C 2. Posterolateral arthrodesis from C3-6 3. Cervical Laminectomy from C3-C4 for decompression of Sabrina spinal cord 4. Harvesting of autograft via Sabrina same incision   OPERATIVE PROCEDURE:  After induction of general anesthesia, Sabrina  Stanley was placed in Sabrina prone position on Sabrina operative table.  A midline incision was then planned using fluoroscopy.  A timeout was performed, and antibiotics given.  Next, Sabrina posterior cervical region was prepped and draped in Sabrina usual sterile fashion. Sabrina incision was injected with local anesthetic, then opened sharply. A subperiosteal dissection was then carried out to expose Sabrina posterior elements from C2 and C6, with careful attention paid to maintaining Sabrina C2/3 facet capsule.  After satisfactory exposure had been obtained, our attention was turned to placement of lateral mass screws.  On each side, Sabrina high speed drill was used to remove Sabrina soft tissue of Sabrina facet from C3/4 to C5/6. Lateral mass screws were then placed at each level using a modified Magerl technique. Briefly, a pilot hole was drilled in Sabrina lateral mass using Sabrina high-speed drill on each side.  Next, a drill was used to drill a tract in each lateral mass to 12mm. A balltip probe was used to confirm lack of breach. We then placed 3.5x 12 mm screws at C3, 3.5x 12 mm screws at C4, 3.5x 12 mm screws at C5, and 3.5x 12 mm screws at C6.  Rods were measured and shaped, then secured to Sabrina screws according to manufacturer's specifications.  After placement of Sabrina lateral mass screws, Sabrina high speed drill was used to drill trough laminectomies from C3 to C4. Sabrina spinous processes were disconnected from Sabrina intraspinous ligaments at C2/3 and C6/7, then Sabrina ligamentum flavum was carefully divided at C2/3 and C6/7 using a Kerrison punch and upgoing curettes. Sabrina laminae and spinous processes were then removed en bloc. Sabrina decompression was completed using a Kerrison punch until Sabrina spinal cord was adequately decompressed.  Sabrina leading edge of C5 and Sabrina inferior margin of C2 were also felt to ensure adequate decompression.   After  decompression was complete, final AP and lateral radiographs were taken.  Sabrina wound was copiously irrigated and  hemostasis was achieved.  Using high-speed drill, Sabrina lateral margin of Sabrina lateral masses was gently decorticated.  Sabrina bone harvested from Sabrina laminectomy was processed mixed with kore fiber and vancomycin powderand placed posterolaterally and in Sabrina facet joints to aid in arthrodesis.  A Hemovac drain was then placed in Sabrina wound deep to Sabrina fascia.   Sabrina wound was closed in a multilayer fashion using interrupted 0 and 2-0 Vicryl sutures.  Sabrina final skin edges were reapproximated using staples.  A wound vac was placed.  After closure, Sabrina Stanley was flipped supine. Stanley was then handed back over to anesthesia.  All counts were correct at Sabrina conclusion of Sabrina procedure.  Neurological monitoring was used throughout, and there were no changes.  Joan Flores acted as an Designer, television/film set throughout Sabrina case. An assistant was required for this procedure due to Sabrina complexity.  Sabrina assistant provided assistance in tissue manipulation and suction, and was required for Sabrina successful and safe performance of Sabrina procedure. I performed Sabrina critical portions of Sabrina procedure.  Lovenia Kim MD  Implant Name Type Inv. Item Serial No. Manufacturer Lot No. LRB No. Used Action  ALLOGRAFT BONE FIBER KORE 5 - [id number] Bone Implant ALLOGRAFT BONE FIBER KORE 5 [id number] MUSCULOSKELETL TRANSPLANT FNDN  N/A 1 Implanted  SCREW SPINAL RELINE C 3.5X12 - [id number] Screw SCREW SPINAL RELINE C 3.5X12  [employer]  N/A 8 Implanted  SCREW LOCK RELINE C OPEN - [id number] Screw SCREW LOCK RELINE C OPEN  [employer]  N/A 8 Implanted  ROD RELINE C PB 3.5 X50 - [id number] Screw ROD RELINE C PB 3.5 X50  [employer]  N/A 2 Implanted

## 2023-12-05 NOTE — Plan of Care (Signed)
 Reviewed procedure, discharge instructions with patient

## 2023-12-05 NOTE — Progress Notes (Signed)
 Dr. Katrinka Blazing made aware that prevena lost suction, OR nurse at bedside PA Nehemiah Settle also at bedside  OK to to place loban at top of prevena. Effective for air leak and has maintained suction. Will continue to monitor closely. Patient medicated for pain. Moving all ext. Noted bil upper ext remain weak, hand grasps weak. Right wrist with splint r/t fall prior to hospital, fx noted per Dr. Katrinka Blazing.

## 2023-12-05 NOTE — Anesthesia Preprocedure Evaluation (Signed)
 Anesthesia Evaluation  Patient identified by MRN, date of birth, ID band Patient awake    Reviewed: Allergy & Precautions, NPO status , Patient's Chart, lab work & pertinent test results  Airway Mallampati: III  TM Distance: >3 FB Neck ROM: Limited    Dental  (+) Chipped, Lower Dentures   Pulmonary sleep apnea , COPD,  COPD inhaler, Current SmokerPatient did not abstain from smoking.   Pulmonary exam normal breath sounds clear to auscultation       Cardiovascular Exercise Tolerance: Good hypertension, Pt. on medications + Peripheral Vascular Disease  (-) Past MI and (-) Cardiac Stents Normal cardiovascular exam Rhythm:Regular Rate:Normal  Normal Stress Echocardiogram  NORMAL RIGHT VENTRICULAR SYSTOLIC FUNCTION  MILD VALVULAR REGURGITATION (See above)  NO VALVULAR STENOSIS NOTED     Neuro/Psych  Headaches PSYCHIATRIC DISORDERS Anxiety Depression Bipolar Disorder   Chronic pain, on high dose narcotics at home.    GI/Hepatic Neg liver ROS, hiatal hernia (small),GERD  Controlled,,  Endo/Other  Hypothyroidism    Renal/GU negative Renal ROS  negative genitourinary   Musculoskeletal   Abdominal   Peds  Hematology  (+) Blood dyscrasia, anemia   Anesthesia Other Findings Chronic steroid use this year  Past Medical History: No date: Abnormal mammogram, unspecified No date: Alcoholism (HCC) No date: Allergic state No date: Anemia No date: Anxiety No date: Arthritis No date: Bilateral breast cysts No date: Bipolar 1 disorder (HCC) No date: Bipolar 1 disorder (HCC) No date: Bone marrow disorder No date: Bronchitis No date: Cervical stenosis of spine No date: Cervicalgia No date: Chronic diarrhea No date: Chronic fatigue disorder No date: Chronic pain No date: COPD (chronic obstructive pulmonary disease) (HCC) 05/2020: COVID-19 No date: Depression No date: Depression No date: Eczema No date: Esophageal  reflux No date: Fibromyalgia No date: GERD (gastroesophageal reflux disease) No date: Headache No date: Hiatal hernia No date: History of esophagogastroduodenoscopy (EGD) No date: Hyperlipidemia No date: Hyperlipidemia No date: Hypertension No date: Hypothyroidism No date: Insomnia No date: Lithium toxicity No date: Menstrual irregularity No date: Myalgia No date: Neuropathy     Comment:  lower legs No date: Osteopenia No date: Osteopenia No date: Panic disorder No date: Pleurisy No date: Pneumonia No date: Psoriasis No date: PVD (peripheral vascular disease) (HCC) No date: Sleep apnea     Comment:  No CPAP No date: Tobacco abuse No date: UTI (urinary tract infection) No date: Vitamin D deficiency No date: Wears dentures     Comment:  partial lower  Past Surgical History: 1996: ABDOMINAL HYSTERECTOMY No date: BACK SURGERY 08/21/2012: BREAST BIOPSY; Right     Comment:  FIBROADENOMATOUS CHANGE WITH CALCIFICATIONS.  No date: BREAST BIOPSY; Right     Comment:  FIBROUS SCARRING, negative for atypia 11/16/2020: CATARACT EXTRACTION W/PHACO; Right     Comment:  Procedure: CATARACT EXTRACTION PHACO AND INTRAOCULAR               LENS PLACEMENT (IOC) RIGHT;  Surgeon: Nevada Crane,              MD;  Location: [facility];  Service:               Ophthalmology;  Laterality: Right;  0.77 0:12.9 12/14/2020: CATARACT EXTRACTION W/PHACO; Left     Comment:  Procedure: CATARACT EXTRACTION PHACO AND INTRAOCULAR               LENS PLACEMENT (IOC) LEFT;  Surgeon: Nevada Crane,  MD;  Location: [facility];  Service:               Ophthalmology;  Laterality: Left;  1.25 0:12.0 No date: COLONOSCOPY 1979, 1980: DILATION AND CURETTAGE OF UTERUS No date: ESOPHAGOGASTRODUODENOSCOPY 09/17/2015: ESOPHAGOGASTRODUODENOSCOPY; N/A     Comment:  Procedure: ESOPHAGOGASTRODUODENOSCOPY (EGD);  Surgeon:               Wallace Cullens, MD;  Location[facility];   Service:               Gastroenterology;  Laterality: N/A; 2010: EYE SURGERY 1998: KNEE SURGERY; Right     Comment:  meniscus repair 1998, 2000: NASAL SINUS SURGERY No date: NECK SURGERY     Comment:  x 2 No date: OOPHORECTOMY 2006, 2008: SPINE SURGERY     Comment:  cervical fusion No date: TONSILLECTOMY  BMI    Body Mass Index: 33.35 kg/m      Reproductive/Obstetrics negative OB ROS                             Anesthesia Physical Anesthesia Plan  ASA: 3  Anesthesia Plan: General   Post-op Pain Management: Ofirmev IV (intra-op)* and Ketamine IV*   Induction: Intravenous  PONV Risk Score and Plan: 3 and Propofol infusion, TIVA, Ondansetron, Dexamethasone and Midazolam  Airway Management Planned: Oral ETT and Video Laryngoscope Planned  Additional Equipment: None  Intra-op Plan:   Post-operative Plan: Extubation in OR  Informed Consent: I have reviewed the patients History and Physical, chart, labs and discussed the procedure including the risks, benefits and alternatives for the proposed anesthesia with the patient or authorized representative who has indicated his/her understanding and acceptance.     Dental advisory given  Plan Discussed with: Anesthesiologist, CRNA and Surgeon  Anesthesia Plan Comments: (Discussed risks of anesthesia with patient, including PONV, sore throat, lip/dental/eye damage. Rare risks discussed as well, such as cardiorespiratory and neurological sequelae, and allergic reactions. Discussed the role of CRNA in patient's perioperative care. Patient understands. Patient counseled on benefits of smoking cessation, and increased perioperative risks associated with continued smoking. Had a discussion about pain control expectations afterwards given chronic opioid use.)        Anesthesia Quick Evaluation

## 2023-12-05 NOTE — Plan of Care (Signed)
  Problem: Pain Management: Goal: Pain level will decrease Outcome: Progressing   

## 2023-12-05 NOTE — Anesthesia Procedure Notes (Signed)
 Procedure Name: Intubation Date/Time: 12/05/2023 9:55 AM  Performed by: Maryla Morrow., CRNAPre-anesthesia Checklist: Patient identified, Patient being monitored, Timeout performed, Emergency Drugs available and Suction available Patient Re-evaluated:Patient Re-evaluated prior to induction Oxygen Delivery Method: Circle system utilized Preoxygenation: Pre-oxygenation with 100% oxygen Induction Type: IV induction Ventilation: Mask ventilation without difficulty Laryngoscope Size: 3 and McGrath Grade View: Grade I Tube type: Oral Tube size: 6.5 mm Number of attempts: 1 Airway Equipment and Method: Stylet Placement Confirmation: ETT inserted through vocal cords under direct vision, positive ETCO2 and breath sounds checked- equal and bilateral Secured at: 20 cm Tube secured with: Tape Dental Injury: Teeth and Oropharynx as per pre-operative assessment

## 2023-12-05 NOTE — Anesthesia Postprocedure Evaluation (Signed)
 Anesthesia Post Note  Patient: ALLEXUS OVENS  Procedure(s) Performed: POSTERIOR CERVICAL FUSION/FORAMINOTOMY LEVEL 3 POSTERIOR CERVICAL LAMINECTOMY  Patient location during evaluation: PACU Anesthesia Type: General Level of consciousness: awake and alert Pain management: pain level controlled Vital Signs Assessment: post-procedure vital signs reviewed and stable Respiratory status: spontaneous breathing, nonlabored ventilation, respiratory function stable and patient connected to nasal cannula oxygen Cardiovascular status: blood pressure returned to baseline and stable Postop Assessment: no apparent nausea or vomiting Anesthetic complications: no   There were no known notable events for this encounter.   Last Vitals:  Vitals:   12/05/23 1300 12/05/23 1354  BP: 136/88   Pulse: 85 94  Resp: 16 18  Temp:  (!) 36.2 C  SpO2: 100% 99%    Last Pain:  Vitals:   12/05/23 1354  TempSrc:   PainSc: 9                  Corinda Gubler

## 2023-12-05 NOTE — Interval H&P Note (Signed)
 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  The various methods of treatment have been discussed with the patient and family. After consideration of risks, benefits and other options for treatment, the patient has consented to  Procedure(s) with comments: POSTERIOR CERVICAL FUSION/FORAMINOTOMY LEVEL 3 (N/A) - C3-6 POSTERIOR SPINAL FUSION POSTERIOR CERVICAL LAMINECTOMY (N/A) - C3-4 LAMINECTOMY as a surgical intervention.  The patient's history has been reviewed, patient examined, no change in status, stable for surgery.  I have reviewed the patient's chart and labs.  Questions were answered to the patient's satisfaction.    Heart and lungs clear, we discussed that we will not be using BMP. We did discuss the importance of smoking cessation, however given her rapid progression of disability we feel that moving forward with decompression is important   Lovenia Kim

## 2023-12-06 ENCOUNTER — Encounter: Payer: Self-pay | Admitting: Neurosurgery

## 2023-12-06 MED ORDER — MORPHINE SULFATE ER 15 MG PO TBCR
15.0000 mg | EXTENDED_RELEASE_TABLET | Freq: Three times a day (TID) | ORAL | Status: DC
  Administered 2023-12-06 – 2023-12-07 (×4): 15 mg via ORAL
  Filled 2023-12-06 (×8): qty 1

## 2023-12-06 MED ORDER — DIAZEPAM 2 MG PO TABS
2.0000 mg | ORAL_TABLET | Freq: Once | ORAL | Status: AC
  Administered 2023-12-06: 2 mg via ORAL
  Filled 2023-12-06: qty 1

## 2023-12-06 MED ORDER — NALOXONE HCL 2 MG/2ML IJ SOSY
PREFILLED_SYRINGE | INTRAMUSCULAR | Status: AC
Start: 2023-12-06 — End: ?
  Filled 2023-12-06: qty 2

## 2023-12-06 NOTE — Plan of Care (Signed)
  Problem: Education: Goal: Knowledge of General Education information will improve Description: Including pain rating scale, medication(s)/side effects and non-pharmacologic comfort measures Outcome: Progressing   Problem: Education: Goal: Ability to verbalize activity precautions or restrictions will improve Outcome: Progressing   Problem: Clinical Measurements: Goal: Ability to maintain clinical measurements within normal limits will improve Outcome: Progressing   Problem: Skin Integrity: Goal: Will show signs of wound healing Outcome: Progressing

## 2023-12-06 NOTE — Progress Notes (Signed)
 Physical Therapy Treatment Patient Details Name: Sabrina Stanley MRN: [id number] DOB: 1960-11-28 Today's Date: 12/06/2023   History of Present Illness Patient is a 63 year old with cervical myelopathy with previous cervical surgery, s/p cervical laminectomy from C3-C4 for decompression of the spinal cord. History of COPD, smoker, HTN, PVD, right wrist fracture/R coronoid process fx 3/15, frequent falls, Bipolar I, progressive myelopathy, cognitive impairment, drug-induced tremors, migranes    PT Comments  Patient seen for a second session today to progress mobility as tolerated as patient has the goal of going home. Patient reports her pain is better controlled this afternoon. She was able to stand with CGA from various surfaces. Increased ambulation distance to 75 ft with hemi walker used for support with cues for improved safety and gait kinematics. Mobility has improved since PT evaluation earlier today. Anticipate patient will need initial physical assistance with mobility at home. She is not interested in going to rehab. Consider home health PT at a minimum. PT will continue to follow to maximize independence and facilitate return to prior level of function.    If plan is discharge home, recommend the following: A lot of help with walking and/or transfers;A lot of help with bathing/dressing/bathroom;Assistance with cooking/housework;Help with stairs or ramp for entrance   Can travel by private vehicle     No  Equipment Recommendations  Other (comment) (hemi walker)    Recommendations for Other Services       Precautions / Restrictions Precautions Precautions: Fall;Back Precaution Booklet Issued: Yes (comment) Recall of Precautions/Restrictions: Impaired Required Braces or Orthoses:  (wrist splint RUE; no cervical brace required) Restrictions Weight Bearing Restrictions Per Provider Order: Yes RUE Weight Bearing Per Provider Order: Non weight bearing Other Position/Activity  Restrictions: per ortho note "Advise against lifting more than a small cup of water with affected wrist. NWB RUE. Wrist brace & sling."     Mobility  Bed Mobility Overal bed mobility: Needs Assistance Bed Mobility: Supine to Sit     Supine to sit: Min assist, HOB elevated     General bed mobility comments: not assessed as patient sitting up on arrival and post session    Transfers Overall transfer level: Needs assistance Equipment used: Hemi-walker Transfers: Sit to/from Stand Sit to Stand: Contact guard assist           General transfer comment: 2 standing bouts performed. cues to maintain NWB of RUE    Ambulation/Gait Ambulation/Gait assistance: Contact guard assist Gait Distance (Feet): 75 Feet Assistive device: Hemi-walker Gait Pattern/deviations: Step-to pattern, Decreased stride length, Narrow base of support, Trunk flexed Gait velocity: decreased     General Gait Details: cues for upright standing posture and proper use of hemi walker. mild unsteadiness but significantly better with use of hemi walker compared to no device. no increased pain is reported with walking and no dizziness with activity   Stairs             Wheelchair Mobility     Tilt Bed    Modified Rankin (Stroke Patients Only)       Balance Overall balance assessment: Needs assistance Sitting-balance support: Feet supported Sitting balance-Leahy Scale: Fair     Standing balance support: Single extremity supported Standing balance-Leahy Scale: Fair Standing balance comment: static standing with SBA using hemi walker for UE support                            Communication Communication Communication: No apparent  difficulties  Cognition Arousal: Alert Behavior During Therapy: WFL for tasks assessed/performed   PT - Cognitive impairments: No family/caregiver present to determine baseline                       PT - Cognition Comments: patient less anxious  than this morning. she reports pain is better controlled Following commands: Intact Following commands impaired: Only follows one step commands consistently    Cueing Cueing Techniques: Verbal cues, Tactile cues  Exercises      General Comments General comments (skin integrity, edema, etc.): educated patient on general spine precuations with handout provided.      Pertinent Vitals/Pain Pain Assessment Pain Assessment: 0-10 Pain Score: 9  Pain Location: neck Pain Descriptors / Indicators: Discomfort, Grimacing, Guarding, Crying Pain Intervention(s): Limited activity within patient's tolerance, Monitored during session, Repositioned    Home Living                          Prior Function            PT Goals (current goals can now be found in the care plan section) Acute Rehab PT Goals Patient Stated Goal: pain control, to go home PT Goal Formulation: With patient Time For Goal Achievement: 12/20/23 Potential to Achieve Goals: Fair Progress towards PT goals: Progressing toward goals    Frequency    7X/week      PT Plan      Co-evaluation              AM-PAC PT "6 Clicks" Mobility   Outcome Measure  Help needed turning from your back to your side while in a flat bed without using bedrails?: A Little Help needed moving from lying on your back to sitting on the side of a flat bed without using bedrails?: A Little Help needed moving to and from a bed to a chair (including a wheelchair)?: A Little Help needed standing up from a chair using your arms (e.g., wheelchair or bedside chair)?: A Little Help needed to walk in hospital room?: A Little Help needed climbing 3-5 steps with a railing? : A Lot 6 Click Score: 17    End of Session   Activity Tolerance: Patient tolerated treatment well Patient left: in chair;with call bell/phone within reach Nurse Communication: Mobility status PT Visit Diagnosis: Unsteadiness on feet (R26.81);Difficulty in  walking, not elsewhere classified (R26.2)     Time: 9528-4132 PT Time Calculation (min) (ACUTE ONLY): 27 min  Charges:    $Gait Training: 8-22 mins $Therapeutic Activity: 8-22 mins PT General Charges $$ ACUTE PT VISIT: 1 Visit                    Donna Bernard, PT, MPT    Ina Homes 12/06/2023, 2:51 PM

## 2023-12-06 NOTE — Discharge Instructions (Signed)
  Your surgeon has performed an operation on your neck to relieve pressure on one or more nerves. Many times, patients feel better immediately after surgery and can "overdo it." Even if you feel well, it is important that you follow these activity guidelines. If you do not let your back heal properly from the surgery, you can increase the chance of hardware complications and/or return of your symptoms. The following are instructions to help in your recovery once you have been discharged from the hospital.  Do not use NSAIDs for 3 months after surgery.   Activity    No bending, lifting, or twisting ("BLT"). Avoid lifting objects heavier than 10 pounds (gallon milk jug).  Where possible, avoid household activities that involve lifting, bending, pushing, or pulling such as laundry, vacuuming, grocery shopping, and childcare. Try to arrange for help from friends and family for these activities while your back heals.  Increase physical activity slowly as tolerated.  Taking short walks is encouraged, but avoid strenuous exercise. Do not jog, run, bicycle, lift weights, or participate in any other exercises unless specifically allowed by your doctor. Avoid prolonged sitting, including car rides.  Talk to your doctor before resuming sexual activity.  You should not drive until cleared by your doctor.  Until released by your doctor, you should not return to work or school.  You should rest at home and let your body heal.   You may shower three days after your surgery.  After showering, lightly dab your incision dry. Do not take a tub bath or go swimming for 3 weeks, or until approved by your doctor at your follow-up appointment.  If you smoke, we strongly recommend that you quit.  Smoking has been proven to interfere with normal healing in your back and will dramatically reduce the success rate of your surgery. Please contact [facility] (800-QUIT-NOW) and use the resources at www.[facility].com for  assistance in stopping smoking.  Surgical Incision   If you have a dressing on your incision, you may remove it three days after your surgery. Keep your incision area clean and dry.   Diet            You may return to your usual diet. Be sure to stay hydrated.  When to Contact us  Although your surgery and recovery will likely be uneventful, you may have some residual numbness, aches, and pains in your back and/or legs. This is normal and should improve in the next few weeks.  However, should you experience any of the following, contact us immediately: New numbness or weakness Pain that is progressively getting worse, and is not relieved by your pain medications or rest Bleeding, redness, swelling, pain, or drainage from surgical incision Chills or flu-like symptoms Fever greater than 101.0 F (38.3 C) Problems with bowel or bladder functions Difficulty breathing or shortness of breath Warmth, tenderness, or swelling in your calf  Contact Information How to contact us:  If you have any questions/concerns before or after surgery, you can reach [place] at [phone], or you can send a mychart message. We can be reached by phone or mychart 8am-4pm, Monday-Friday.  *Please note: Calls after 4pm are forwarded to a third party answering service. Mychart messages are not routinely monitored during evenings, weekends, and holidays. Please call our office to contact the answering service for urgent concerns during non-business hours.

## 2023-12-06 NOTE — Progress Notes (Signed)
 PT went to pt's room, upon assessment of ambulating found 5 white round pills in the bed. The pills were brought to nurse taking care of pt. Nurse notified AD Ty Hilts, and  PA Danielle. Nurse collected all medications at bedside and formally sealed them. Nurse delivered all medications to pharmacy. Pt states "She did not take any medication other than ones given to her by nurse. She states the pills must have fell out of her purse."

## 2023-12-06 NOTE — Evaluation (Signed)
 Occupational Therapy Evaluation Patient Details Name: Sabrina Stanley MRN: [id number] DOB: 1960-10-08 Today's Date: 12/06/2023   History of Present Illness   Patient is a 63 year old with cervical myelopathy with previous cervical surgery, s/p cervical laminectomy from C3-C4 for decompression of the spinal cord. History of COPD, smoker, HTN, PVD, right wrist fracture/R coronoid process fx 3/15, frequent falls, Bipolar I, progressive myelopathy, cognitive impairment, drug-induced tremors, migranes     Clinical Impressions Pt received in recliner, tearful and c/o 10/10 pain (recently provided with pain medication). Pt overall a poor historian, states she lives alone, falls often, uses cane or RW for mobility although is unable to describe further. Pt is NWB on RUE following recent wrist fx but has difficulties adhering to UE precautions during functional transfers. MOD A required to perform sit<>stand from recliner, assist to control descent and cues for neutral cervical alignment as pt attempts to flex forward. Requires +1 HHA for static standing balance. Anticipate pt will require MIN A for feeding/grooming/UB ADL tasks due to chronic UE deficits in strength/sensation/FMC/GMC, and up to MAX A for LB ADLs.   Pt is a high risk for falls, and is unsafe to discharge home without 24/7 assist for all mobility/ADL performance. Pt presents with decreased insight into deficits, perseverates on pain meds/wanting to go home, and has functional limitations in strength, balance, cognition, ROM, FMC/GMC which warrant the need for skilled OT services. OT will continue to follow acutely. Patient will benefit from continued inpatient follow up therapy, <3 hours/day.    If plan is discharge home, recommend the following:   A lot of help with walking and/or transfers;A lot of help with bathing/dressing/bathroom;Direct supervision/assist for medications management;Direct supervision/assist for financial  management;Assist for transportation;Supervision due to cognitive status;Assistance with cooking/housework     Functional Status Assessment   Patient has had a recent decline in their functional status and demonstrates the ability to make significant improvements in function in a reasonable and predictable amount of time.     Equipment Recommendations   None recommended by OT     Recommendations for Other Services         Precautions/Restrictions   Precautions Precautions: Fall;Back Precaution Booklet Issued: No Recall of Precautions/Restrictions: Impaired Required Braces or Orthoses:  (wrist splint RUE; no cervical brace required) Restrictions Weight Bearing Restrictions Per Provider Order: Yes RUE Weight Bearing Per Provider Order: Non weight bearing Other Position/Activity Restrictions: per ortho note "Advise against lifting more than a small cup of water with affected wrist. NWB RUE. Wrist brace & sling."     Mobility Bed Mobility Overal bed mobility: Needs Assistance Bed Mobility: Supine to Sit     Supine to sit: Min assist, HOB elevated     General bed mobility comments: NT, in recliner pre and post session    Transfers Overall transfer level: Needs assistance Equipment used: 1 person hand held assist Transfers: Sit to/from Stand Sit to Stand: Mod assist           General transfer comment: lifting and lowering assistance provided for safety. cues for task initiation. intermittent cues to maintain NWB of RUE      Balance Overall balance assessment: Needs assistance Sitting-balance support: Feet supported Sitting balance-Leahy Scale: Fair     Standing balance support: Single extremity supported Standing balance-Leahy Scale: Poor Standing balance comment: external support required to maintain standing balance with general unsteadiness  ADL either performed or assessed with clinical judgement   ADL Overall  ADL's : Needs assistance/impaired Eating/Feeding: Minimal assistance;Sitting Eating/Feeding Details (indicate cue type and reason): anticipate requiring assist for opening containers due to decreased FMC of BUE Grooming: Sitting;Minimal assistance   Upper Body Bathing: Minimal assistance;Sitting   Lower Body Bathing: Maximal assistance;Sit to/from stand   Upper Body Dressing : Sitting;Maximal assistance   Lower Body Dressing: Maximal assistance;Total assistance;Sit to/from stand   Toilet Transfer: Maximal assistance;BSC/3in1;Cueing for sequencing;Cueing for safety   Toileting- Clothing Manipulation and Hygiene: Maximal assistance;Sit to/from stand       Functional mobility during ADLs: Moderate assistance;Cueing for sequencing;Cueing for safety General ADL Comments: Pt performed 1 STS transfer with +1 HHA MOD A, tearful requiring cues for neutral cervical alignment. Second attempt pt stands impulsively with CGA and walks forward, OT providing assist to prevent lateral LOB     Vision Baseline Vision/History: 1 Wears glasses              Pertinent Vitals/Pain Pain Assessment Pain Assessment: 0-10 Pain Score: 10-Worst pain ever Pain Location: neck Pain Descriptors / Indicators: Discomfort, Grimacing, Guarding, Crying Pain Intervention(s): Limited activity within patient's tolerance, Monitored during session, Premedicated before session, Repositioned     Extremity/Trunk Assessment Upper Extremity Assessment Upper Extremity Assessment: Right hand dominant;RUE deficits/detail;LUE deficits/detail;Generalized weakness (chronic BUE deficits with sensation deficits, impaired coordination) RUE Deficits / Details: R brace donned RUE Sensation: history of peripheral neuropathy RUE Coordination: decreased fine motor;decreased gross motor LUE Sensation: history of peripheral neuropathy LUE Coordination: decreased gross motor;decreased fine motor   Lower Extremity Assessment Lower  Extremity Assessment: RLE deficits/detail RLE Deficits / Details: knee extension 5/5, dorsiflexion 5/5, plantarflexion 5/5, hip add/abd 4+/5, hip flexion 4/5 LLE Deficits / Details: knee extension 4+/5, dorsiflexion 4/5, plantarflexion 5/5, hip add/abd 4+/5, hip flexion 4/5 LLE Sensation: history of peripheral neuropathy   Cervical / Trunk Assessment Cervical / Trunk Assessment: Neck Surgery   Communication Communication Communication: No apparent difficulties   Cognition Arousal: Alert Behavior During Therapy: WFL for tasks assessed/performed, Impulsive (tearful) Cognition: History of cognitive impairments             OT - Cognition Comments: decreased insight into deficits, pt very unsteady but insisting she can care for herself at home                 Following commands: Impaired Following commands impaired: Follows one step commands with increased time     Cueing  General Comments   Cueing Techniques: Verbal cues;Tactile cues  Lines/leads intact start and end of session.            Home Living Family/patient expects to be discharged to:: Private residence Living Arrangements: Alone Available Help at Discharge: Friend(s);Available PRN/intermittently (friend Alvino Chapel is planning to stay for a week or so) Type of Home: House Home Access: Stairs to enter [employer] of Steps: 3-4 Entrance Stairs-Rails: Left Home Layout: One level     Bathroom Shower/Tub: Tub/shower unit         Home Equipment: Agricultural consultant (2 wheels);Shower seat          Prior Functioning/Environment Prior Level of Function : History of Falls (last six months);Independent/Modified Independent;Patient poor historian/Family not available             Mobility Comments: Mod I, gets PT at home. was trialing different DME  (cane vs hemiwalker), frequent falls hx ADLs Comments: mod indepdent, driving    OT Problem List: Decreased strength;Decreased range  of motion;Impaired  balance (sitting and/or standing);Decreased activity tolerance;Decreased coordination;Decreased cognition;Decreased safety awareness;Decreased knowledge of use of DME or AE;Decreased knowledge of precautions;Impaired sensation;Impaired UE functional use;Pain   OT Treatment/Interventions: Self-care/ADL training;Therapeutic exercise;Neuromuscular education;Energy conservation;DME and/or AE instruction;Therapeutic activities;Patient/family education;Balance training      OT Goals(Current goals can be found in the care plan section)   Acute Rehab OT Goals Patient Stated Goal: to go home OT Goal Formulation: With patient Time For Goal Achievement: 12/20/23 Potential to Achieve Goals: Fair   OT Frequency:  Min 2X/week       AM-PAC OT "6 Clicks" Daily Activity     Outcome Measure Help from another person eating meals?: A Little Help from another person taking care of personal grooming?: A Little Help from another person toileting, which includes using toliet, bedpan, or urinal?: A Lot Help from another person bathing (including washing, rinsing, drying)?: A Lot Help from another person to put on and taking off regular upper body clothing?: A Lot Help from another person to put on and taking off regular lower body clothing?: A Lot 6 Click Score: 14   End of Session Nurse Communication: Mobility status;Patient requests pain meds  Activity Tolerance: Patient limited by pain Patient left: in chair;with call bell/phone within reach  OT Visit Diagnosis: Muscle weakness (generalized) (M62.81);Repeated falls (R29.6);History of falling (Z91.81);Pain;Unsteadiness on feet (R26.81);Other abnormalities of gait and mobility (R26.89) Pain - part of body:  (neck)                Time: 4098-1191 OT Time Calculation (min): 15 min Charges:  OT General Charges $OT Visit: 1 Visit OT Evaluation $OT Eval Moderate Complexity: 1 Mod  Zhaniya Swallows L. Jamina Macbeth, OTR/L  12/06/23, 12:16 PM

## 2023-12-06 NOTE — Plan of Care (Signed)
 progressing

## 2023-12-06 NOTE — Progress Notes (Signed)
   Neurosurgery Progress Note  History: Sabrina Stanley is s/p C3-4 laminectomy for decompression. C3-6 PSF  POD1: Pt experiencing significant neck pain this morning  Physical Exam: Vitals:   12/05/23 2005 12/06/23 0500  BP: 120/65 118/62  Pulse: 75 78  Resp: 16 16  Temp: 97.9 F (36.6 C) 97.8 F (36.6 C)  SpO2: 100% 100%    AA Ox3 CNI   Strength: Side Biceps Triceps Deltoid Interossei Grip Wrist Ext. Wrist Flex.  R 4- 4- 3 4- 4 3 3   L 4+ 4+ 4+ 4+ 4+ 4- 4-   HV output 55 yesterday. No overnight output recorded.   Data:  Other tests/results: NA  Assessment/Plan:  Sabrina Stanley is a 63 y.o with cervical stenosis and myelopathy s/p decompression and fusion   - mobilize - pain control; Added home scheduled Morphine.  - DVT prophylaxis - PTOT  Manning Charity PA-C Department of Neurosurgery

## 2023-12-06 NOTE — Evaluation (Addendum)
 Physical Therapy Evaluation Patient Details Name: Sabrina Stanley MRN: [id number] DOB: 1960/10/19 Today's Date: 12/06/2023  History of Present Illness  Patient is a 63 year old with cervical myelopathy with previous cervical surgery. s/p cervical laminectomy from C3-C4 for decompression of the spinal cord. History of COPD, smoker, HTN, PVD, right wrist fracture  Clinical Impression  Patient is agreeable to PT evaluation. She is tearful and complains of pain. Nurse gave pain medications during the session. The patient is a poor historian. She has a recent right arm fracture following fall and is NWB on the RUE. She reports she has been participating with home physical therapy and was using a cane or hemi walker with ambulation while working with therapy. She lives alone but plans to have a friend stay with her for a week after this hospital stay. She has several steps to enter the house.  Today the patient complains of 10/10 pain and reports she just wants to go home. She required physical assistance with all mobility. Standing balance is poor with external support required from therapist. She is generally unsteady with short distance ambulation with staggering to the right. Up to Mod A is required for ambulation for safety and fall prevention. Anticipate patient will require initial physical assistance with mobility after this hospital stay. Consider rehabilitation < 3 hours/day, however patient wants to return home. PT will continue to follow to maximize independence and to decrease caregiver burden.       If plan is discharge home, recommend the following: A lot of help with walking and/or transfers;A lot of help with bathing/dressing/bathroom;Assistance with cooking/housework;Help with stairs or ramp for entrance   Can travel by private vehicle   No    Equipment Recommendations None recommended by PT  Recommendations for Other Services       Functional Status Assessment Patient has had a  recent decline in their functional status and demonstrates the ability to make significant improvements in function in a reasonable and predictable amount of time.     Precautions / Restrictions Precautions Precautions: Fall;Back Precaution Booklet Issued: No Recall of Precautions/Restrictions: Impaired Required Braces or Orthoses:  (wrist splint RUE; no cervical brace required) Restrictions Weight Bearing Restrictions Per Provider Order: Yes RUE Weight Bearing Per Provider Order: Non weight bearing Other Position/Activity Restrictions: per ortho note "Advise against lifting more than a small cup of water with affected wrist. NWB RUE. Wrist brace & sling."      Mobility  Bed Mobility Overal bed mobility: Needs Assistance Bed Mobility: Supine to Sit     Supine to sit: Min assist, HOB elevated     General bed mobility comments: trunk support provied. encouraged logroll technique, however patient reports she sleeps in a chair at home    Transfers Overall transfer level: Needs assistance Equipment used: 1 person hand held assist Transfers: Sit to/from Stand Sit to Stand: Mod assist           General transfer comment: lifting and lowering assistance provided for safety. cues for task initiation. intermittent cues to maintain NWB of RUE    Ambulation/Gait Ambulation/Gait assistance: Mod assist, Min assist Gait Distance (Feet): 20 Feet Assistive device: 1 person hand held assist Gait Pattern/deviations: Step-through pattern, Staggering right, Narrow base of support Gait velocity: decreased     General Gait Details: patient has generally unsteady gait with occasional staggering to the right. gait distance limited by pain and fatigue with minimal activity  Stairs  Wheelchair Mobility     Tilt Bed    Modified Rankin (Stroke Patients Only)       Balance Overall balance assessment: Needs assistance Sitting-balance support: Feet supported Sitting  balance-Leahy Scale: Fair     Standing balance support: Single extremity supported Standing balance-Leahy Scale: Poor Standing balance comment: external support required to maintain standing balance with general unsteadiness                             Pertinent Vitals/Pain Pain Assessment Pain Assessment: 0-10 Pain Score: 10-Worst pain ever Pain Location: neck Pain Descriptors / Indicators: Discomfort, Grimacing, Guarding, Crying Pain Intervention(s): Monitored during session, Limited activity within patient's tolerance, Repositioned, RN gave pain meds during session    Home Living Family/patient expects to be discharged to:: Private residence Living Arrangements: Alone Available Help at Discharge: Friend(s);Available PRN/intermittently (friend Alvino Chapel is planning to stay for a week or so) Type of Home: House Home Access: Stairs to enter Entrance Stairs-Rails: Left Entrance Stairs-Number of Steps: 3-4   Home Layout: One level Home Equipment: Agricultural consultant (2 wheels);Shower seat      Prior Function Prior Level of Function : History of Falls (last six months);Independent/Modified Independent;Patient poor historian/Family not available             Mobility Comments: Mod I, gets PT at home. was trialing different DME  (cane vs hemiwalker)       Extremity/Trunk Assessment   Upper Extremity Assessment Upper Extremity Assessment: Right hand dominant;Defer to OT evaluation (RUE wrist splint in place)    Lower Extremity Assessment Lower Extremity Assessment: RLE deficits/detail;LLE deficits/detail RLE Deficits / Details: knee extension 5/5, dorsiflexion 5/5, plantarflexion 5/5, hip add/abd 4+/5, hip flexion 4/5 LLE Deficits / Details: knee extension 4+/5, dorsiflexion 4/5, plantarflexion 5/5, hip add/abd 4+/5, hip flexion 4/5 LLE Sensation: history of peripheral neuropathy       Communication   Communication Communication: No apparent difficulties     Cognition Arousal: Alert Behavior During Therapy: WFL for tasks assessed/performed   PT - Cognitive impairments: No family/caregiver present to determine baseline                       PT - Cognition Comments: Patient is tearful and wants to go home. She seems to have decreased awareness of her impairments and perseverates on pain. She is a poor historian Following commands: Impaired Following commands impaired: Follows one step commands with increased time     Cueing Cueing Techniques: Verbal cues, Tactile cues     General Comments General comments (skin integrity, edema, etc.): After assisting patient to sit up in the bed, this therapist noted several white pills in the bed. I put the pills on the computer table out of the patient's reach and alerted the nurse who removed the pills from the room.    Exercises     Assessment/Plan    PT Assessment Patient needs continued PT services  PT Problem List Decreased strength;Decreased range of motion;Decreased activity tolerance;Decreased balance;Decreased mobility;Decreased cognition;Decreased knowledge of use of DME;Decreased safety awareness;Decreased knowledge of precautions;Pain       PT Treatment Interventions DME instruction;Gait training;Stair training;Functional mobility training;Therapeutic activities;Therapeutic exercise;Neuromuscular re-education;Balance training;Patient/family education;Cognitive remediation    PT Goals (Current goals can be found in the Care Plan section)  Acute Rehab PT Goals Patient Stated Goal: to go home today PT Goal Formulation: With patient Time For Goal Achievement: 12/20/23 Potential to Achieve Goals:  Fair    Frequency Min 7x/week     Co-evaluation               AM-PAC PT "6 Clicks" Mobility  Outcome Measure Help needed turning from your back to your side while in a flat bed without using bedrails?: A Little Help needed moving from lying on your back to sitting on the side  of a flat bed without using bedrails?: A Little Help needed moving to and from a bed to a chair (including a wheelchair)?: A Lot Help needed standing up from a chair using your arms (e.g., wheelchair or bedside chair)?: A Lot Help needed to walk in hospital room?: A Lot Help needed climbing 3-5 steps with a railing? : A Lot 6 Click Score: 14    End of Session Equipment Utilized During Treatment:  (R wrist splint) Activity Tolerance: Patient limited by fatigue;Patient limited by pain Patient left: in chair;with call bell/phone within reach Nurse Communication: Mobility status PT Visit Diagnosis: Unsteadiness on feet (R26.81);Difficulty in walking, not elsewhere classified (R26.2)    Time: 1610-9604 PT Time Calculation (min) (ACUTE ONLY): 24 min   Charges:   PT Evaluation $PT Eval Moderate Complexity: 1 Mod PT Treatments $Therapeutic Activity: 8-22 mins PT General Charges $$ ACUTE PT VISIT: 1 Visit         Donna Bernard, PT, MPT   Ina Homes 12/06/2023, 9:35 AM

## 2023-12-07 MED ORDER — METHOCARBAMOL 500 MG PO TABS: 500.0000 mg | ORAL_TABLET | Freq: Four times a day (QID) | ORAL | 0 refills | Status: AC | PRN

## 2023-12-07 MED ORDER — OXYCODONE HCL 15 MG PO TABS: 15.0000 mg | ORAL_TABLET | ORAL | 0 refills | Status: DC | PRN

## 2023-12-07 MED ORDER — ACETAMINOPHEN 325 MG PO TABS: 650.0000 mg | ORAL_TABLET | Freq: Four times a day (QID) | ORAL | 2 refills | Status: DC

## 2023-12-07 MED ORDER — SENNA 8.6 MG PO TABS: 1.0000 | ORAL_TABLET | Freq: Two times a day (BID) | ORAL | 0 refills | Status: AC

## 2023-12-07 MED ORDER — DOCUSATE SODIUM 100 MG PO CAPS: 100.0000 mg | ORAL_CAPSULE | Freq: Two times a day (BID) | ORAL | 0 refills | Status: AC

## 2023-12-07 NOTE — Progress Notes (Signed)
 Physical Therapy Treatment Patient Details Name: Sabrina Stanley MRN: [id number] DOB: [birth year] Today's Date: 12/07/2023   History of Present Illness Patient is a 63 year old with cervical myelopathy with previous cervical surgery, s/p cervical laminectomy from C3-C4 for decompression of the spinal cord. History of COPD, smoker, HTN, PVD, right wrist fracture/R coronoid process fx 3/15, frequent falls, Bipolar I, progressive myelopathy, cognitive impairment, drug-induced tremors, migranes    PT Comments  Pt received up in chair. Discussed importance of maintaining R UE NWB compliance. Pt stated she understood, then went to push off with R UE to assist with standing. Pt encouraged to don sling at home to prevent use. Also educated pt's friend to assist with follow through. Pt completed gait training with hemiwalker in hall with S/light CGA with turns. Cues for wider base of support and sequencing. Pt assisted in bathroom requiring MinA for balance to safely turn and lower to toilet. Pt is currently declining STR, HHPT has been ordered. Awaiting delivery of hemiwalker for home use.    If plan is discharge home, recommend the following: A lot of help with walking and/or transfers;A lot of help with bathing/dressing/bathroom;Assistance with cooking/housework;Help with stairs or ramp for entrance   Can travel by private vehicle     No  Equipment Recommendations  Other (comment) (Hemi-walker)    Recommendations for Other Services       Precautions / Restrictions Precautions Precautions: Fall;Cervical Precaution Booklet Issued: Yes (comment) Recall of Precautions/Restrictions: Impaired Precaution/Restrictions Comments: constantly attempts to use RUE Required Braces or Orthoses: Splint/Cast Splint/Cast: RUE, no cervical brace needed per orders Restrictions Weight Bearing Restrictions Per Provider Order: Yes RUE Weight Bearing Per Provider Order: Non weight bearing Other Position/Activity  Restrictions: per ortho note "Advise against lifting more than a small cup of water with affected wrist. NWB RUE. Wrist brace & sling."     Mobility  Bed Mobility               General bed mobility comments: NT, pt in recliner pre-and-post session    Transfers Overall transfer level: Needs assistance Equipment used: Hemi-walker Transfers: Sit to/from Stand Sit to Stand: Contact guard assist           General transfer comment: Cues needed not to push off with R UE    Ambulation/Gait Ambulation/Gait assistance: Contact guard assist Gait Distance (Feet): 75 Feet Assistive device: Hemi-walker Gait Pattern/deviations: Step-to pattern, Decreased stride length, Narrow base of support Gait velocity: decreased     General Gait Details: Cues for proper sequencing, wider base of support and safety awareness   Stairs             Wheelchair Mobility     Tilt Bed    Modified Rankin (Stroke Patients Only)       Balance Overall balance assessment: Needs assistance Sitting-balance support: Feet supported Sitting balance-Leahy Scale: Good     Standing balance support: Single extremity supported Standing balance-Leahy Scale: Fair Standing balance comment: Slight fall risk while ambulating with hemi-walker if not focused on task                            Communication Communication Communication: No apparent difficulties  Cognition Arousal: Alert Behavior During Therapy: WFL for tasks assessed/performed   PT - Cognitive impairments: No family/caregiver present to determine baseline  PT - Cognition Comments: Explained NWB importance with R UE. Pt states good understanding but endorses she will still need to use it Following commands: Intact Following commands impaired: Only follows one step commands consistently    Cueing Cueing Techniques: Verbal cues, Tactile cues  Exercises Other Exercises Other Exercises:  Educated friend on assisting pt with compliance and fall prevention at home    General Comments General comments (skin integrity, edema, etc.): Pt educated at length on importance of NWB and avoiding use of R UE, fall prevention at home, and benefits of first alert button      Pertinent Vitals/Pain Pain Assessment Pain Assessment: No/denies pain    Home Living                          Prior Function            PT Goals (current goals can now be found in the care plan section) Acute Rehab PT Goals Patient Stated Goal: pain control, to go home    Frequency    7X/week      PT Plan      Co-evaluation              AM-PAC PT "6 Clicks" Mobility   Outcome Measure  Help needed turning from your back to your side while in a flat bed without using bedrails?: A Little Help needed moving from lying on your back to sitting on the side of a flat bed without using bedrails?: A Little Help needed moving to and from a bed to a chair (including a wheelchair)?: A Little Help needed standing up from a chair using your arms (e.g., wheelchair or bedside chair)?: A Little Help needed to walk in hospital room?: A Little Help needed climbing 3-5 steps with a railing? : A Lot 6 Click Score: 17    End of Session Equipment Utilized During Treatment: Gait belt Activity Tolerance: Patient tolerated treatment well Patient left: in chair;with call bell/phone within reach Nurse Communication: Mobility status PT Visit Diagnosis: Unsteadiness on feet (R26.81);Difficulty in walking, not elsewhere classified (R26.2)     Time: 4098-1191 PT Time Calculation (min) (ACUTE ONLY): 26 min  Charges:    $Gait Training: 8-22 mins $Therapeutic Activity: 8-22 mins PT General Charges $$ ACUTE PT VISIT: 1 Visit                    Zadie Cleverly, PTA  Jannet Askew 12/07/2023, 1:24 PM

## 2023-12-07 NOTE — Discharge Summary (Signed)
 Physician Discharge Summary  Patient ID: Sabrina Stanley MRN: [id number] DOB/AGE: [birth year] 64 y.o.  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 Melessa Cowell is a 63 y.o. female with history of a previous anterior cervical discectomy and fusion who presented with progressive cervical myelopathy.  She was found to have adjacent level disease with severe stenosis and T2 signal change noted in her cervical spinal cord given her progressive myelopathy and worsening disability and hand function she was taken to the operating room for posterior cervical decompression from C3-4 and posterior cervical fusion from C3-C6.  Her operative course was as expected.  Postoperative day 1 she was seen and experiencing significant manage neck pain.  Her Hemovac output was approximately 55.  Unfortunately she was found with outside narcotic medications in her bed.  This was removed by staff and she was counseled on the dangers of using outside narcotic medication with the narcotics she is getting inpatient and risk of overdose.  Postoperative day 2 her pain was improved.  Prevena removed, drain pulled and honeycomb dressing was placed.  She was recommended for skilled nursing facility but has refused.  As a result she will be going home with home health.  She is aware of the risks of going against recommendations, but would like to continue with this plan.  She was discharged in stable condition.    Consults: None  Significant Diagnostic Studies: None  Treatments: IV hydration, antibiotics: Ancef, analgesia: Morphine, anticoagulation: LMW heparin, and  therapies: PT, OT, and SW  Discharge Exam: Blood pressure (!) 141/72, pulse 84, temperature 98.1 F (36.7 C), temperature source Oral, resp. rate 15, height 5\' 2"  (1.575 m), weight 65.4 kg, SpO2 96%.   Side Biceps Triceps Deltoid Interossei Grip Wrist Ext. Wrist Flex.  R 4- 4- 3 4- 4 3 3   L 4+ 4+ 4+ 4+ 4+ 4- 4-      Disposition: Home with home health  Discharge Instructions     Incentive spirometry RT   Complete by: As directed          Signed: Joan Flores 12/07/2023, 10:05 AM

## 2023-12-07 NOTE — TOC Progression Note (Addendum)
 Transition of Care [facility]) - Progression Note    Patient Details  Name: Sabrina Stanley MRN: [id number] Date of Birth: 21-Sep-1960  Transition of Care [facility] CM/SW Contact  Marlowe Sax, RN Phone Number: 12/07/2023, 10:34 AM  Clinical Narrative:     Patient has used [employer] in the past and wants to use them again, I sent referral to [facility] with [employer] Hemi walker to be delivered by [employer] to the bedside  Expected Discharge Plan: Home w Home Health Services Barriers to Discharge: Continued Medical Work up  Expected Discharge Plan and Services   Discharge Planning Services: CM Consult   Living arrangements for the past 2 months: Single Family Home Expected Discharge Date: 12/07/23                                     Social Determinants of Health (SDOH) Interventions SDOH Screenings   Food Insecurity: No Food Insecurity (12/05/2023)  Housing: Low Risk  (12/05/2023)  Transportation Needs: No Transportation Needs (12/05/2023)  Utilities: Not At Risk (12/05/2023)  Financial Resource Strain: Low Risk  (11/15/2023)   Received from [facility]  Tobacco Use: High Risk (12/05/2023)    Readmission Risk Interventions     No data to display

## 2023-12-07 NOTE — Progress Notes (Signed)
 DISCHARGE NOTE:  Pt dc with IV removed and dc instructions given. Pt voices no questions or concerns at this time. Pt medications picked up from pharmacy and returned to pt by the nurse. Pt received a hemi walker delivered to hospital room. Pt wheeled down to medical mall entrance by staff and pt's friend provided transportation.

## 2023-12-07 NOTE — Progress Notes (Signed)
 Occupational Therapy Treatment Patient Details Name: Sabrina Stanley MRN: [id number] DOB: [birth year] Today's Date: 12/07/2023   History of present illness Patient is a 63 year old with cervical myelopathy with previous cervical surgery, s/p cervical laminectomy from C3-C4 for decompression of the spinal cord. History of COPD, smoker, HTN, PVD, right wrist fracture/R coronoid process fx 3/15, frequent falls, Bipolar I, progressive myelopathy, cognitive impairment, drug-induced tremors, migranes   OT comments  Pt with improved affect and mobility performance this session. Received on commode after toileting tasks with RN. Pt requires verbal cues for NWB adherence on RUE throughout session. Functional transfers from regular commode with CGA + hemiwalker, stands at sink to wash hands with CGA for dynamic standing balance, and performs UB/LB dressing from sit<>stand in recliner with increased time (see flowsheet below for specific details). Pt educated in precautions with handout provided, self care skills, bed mobility and functional transfer training, AE/DME for bathing, dressing, and toileting needs, and home/routines modifications and falls prevention strategies to maximize safety and functional independence while minimizing falls risk and maintaining precautions. Pt verbalized understanding of all education/training provided. Pt presents with deficits in executive functioning, cognition, strength, ROM, balance and is a high risk for falls. OT recommends pt receive continued inpatient follow up therapy, <3 hours/day but pt insists on returning home. If pt discharges home, will require direct 24/7 supervision and assist for ADL/IADL/mobility performance due to the deficits mentioned above.       If plan is discharge home, recommend the following:  A lot of help with walking and/or transfers;A lot of help with bathing/dressing/bathroom;Direct supervision/assist for medications management;Direct  supervision/assist for financial management;Assist for transportation;Supervision due to cognitive status;Assistance with cooking/housework   Equipment Recommendations  None recommended by OT;BSC/3in1 Psychologist, educational)    Recommendations for Other Services      Precautions / Restrictions Precautions Precautions: Fall;Cervical/Back Precaution Booklet Issued: Yes (comment) Recall of Precautions/Restrictions: Impaired Precaution/Restrictions Comments: constantly attempts to use hemiwalker with RUE Required Braces or Orthoses: Splint/Cast Splint/Cast: RUE, no cervical brace needed per orders Restrictions Weight Bearing Restrictions Per Provider Order: Yes RUE Weight Bearing Per Provider Order: Non weight bearing Other Position/Activity Restrictions: per ortho note "Advise against lifting more than a small cup of water with affected wrist. NWB RUE. Wrist brace & sling."       Mobility Bed Mobility               General bed mobility comments: NT, pt in recliner pre-and-post session    Transfers Overall transfer level: Needs assistance Equipment used: Hemi-walker Transfers: Sit to/from Stand Sit to Stand: Contact guard assist           General transfer comment: 4x standing bouts. cues for NWB RUE     Balance Overall balance assessment: Needs assistance Sitting-balance support: Feet supported Sitting balance-Leahy Scale: Fair     Standing balance support: Single extremity supported Standing balance-Leahy Scale: Fair Standing balance comment: static standing with SBA using hemi walker for UE support                           ADL either performed or assessed with clinical judgement   ADL Overall ADL's : Needs assistance/impaired     Grooming: Wash/dry hands;Standing;Contact guard assist Grooming Details (indicate cue type and reason): CGA for standing balance, cues to adhere to NWB precautions RUE and to not twist to throw away paper towel         Upper  Body Dressing : Supervision/safety;Sitting Upper Body Dressing Details (indicate cue type and reason): increased time. pt attempts multiple times to place UE in hood of shirt, tries to don with shirt inside out. When given time, pt is able to problem-solve without assist or cues, but does require 5+ mins to put on shirt due to making same error over and over Lower Body Dressing: Contact guard assist;Sit to/from stand Lower Body Dressing Details (indicate cue type and reason): CGA for standing balance to pull pants and underwear over hips; dismissive of OT cues for edu on sequencing of task performance to prevent falls. able to put on LB items using seated figure four with increased time. Toilet Transfer: Contact guard assist;Ambulation;Grab bars (with hemiwalker) Toilet Transfer Details (indicate cue type and reason): slow movements, pt generally unsteady and attempts to perform without AD. when cues to use hemiwalker, pt tries to use RUE despite NWB precautions Toileting- Clothing Manipulation and Hygiene: Supervision/safety;Sit to/from stand Toileting - Clothing Manipulation Details (indicate cue type and reason): pt recieved on commode after toileting     Functional mobility during ADLs: Contact guard assist (hemiwalker) General ADL Comments: Requires increased time to perform ADLs. T/f bathroom in room using hemiwalker. Generally unsteady but no overt LOB. Slow step pattern.     Communication Communication Communication: No apparent difficulties   Cognition Arousal: Alert Behavior During Therapy: WFL for tasks assessed/performed Cognition: History of cognitive impairments             OT - Cognition Comments: decreased insight into deficits, decreased problem-solving, organization and reasoning                 Following commands: Intact Following commands impaired: Only follows one step commands consistently      Cueing   Cueing Techniques: Verbal cues, Tactile cues   Exercises Exercises: Other exercises Other Exercises Other Exercises: edu provided to pt for precautions, safe ADL performance            Pertinent Vitals/ Pain       Pain Assessment Pain Assessment: Faces Faces Pain Scale: Hurts little more Pain Location: neck Pain Descriptors / Indicators: Discomfort, Grimacing, Guarding, Crying Pain Intervention(s): Limited activity within patient's tolerance         Frequency  Min 2X/week        Progress Toward Goals  OT Goals(current goals can now be found in the care plan section)  Progress towards OT goals: Progressing toward goals  Acute Rehab OT Goals Patient Stated Goal: to go home OT Goal Formulation: With patient Time For Goal Achievement: 12/20/23 Potential to Achieve Goals: Fair ADL Goals Pt Will Perform Grooming: with contact guard assist;standing;sitting Pt Will Perform Upper Body Dressing: with min assist;sitting Pt Will Perform Lower Body Dressing: sit to/from stand;with contact guard assist Pt Will Transfer to Toilet: with contact guard assist;ambulating Pt Will Perform Toileting - Clothing Manipulation and hygiene: with contact guard assist;sitting/lateral leans;sit to/from stand;with adaptive equipment  Plan         AM-PAC OT "6 Clicks" Daily Activity     Outcome Measure   Help from another person eating meals?: None Help from another person taking care of personal grooming?: None Help from another person toileting, which includes using toliet, bedpan, or urinal?: A Little Help from another person bathing (including washing, rinsing, drying)?: A Little Help from another person to put on and taking off regular upper body clothing?: A Little Help from another person to put on and taking off regular lower  body clothing?: A Little 6 Click Score: 20    End of Session Equipment Utilized During Treatment:  (hemiwalker)  OT Visit Diagnosis: Muscle weakness (generalized) (M62.81);Repeated falls (R29.6);History  of falling (Z91.81);Pain;Unsteadiness on feet (R26.81);Other abnormalities of gait and mobility (R26.89)   Activity Tolerance Patient tolerated treatment well   Patient Left in chair;with call bell/phone within reach   Nurse Communication Mobility status        Time: 1610-9604 OT Time Calculation (min): 23 min  Charges: OT General Charges $OT Visit: 1 Visit OT Treatments $Self Care/Home Management : 23-37 mins  Lyden Redner L. Inette Doubrava, OTR/L  12/07/23, 10:54 AM

## 2023-12-07 NOTE — Progress Notes (Signed)
   Neurosurgery Progress Note  History: KENOSHA DOSTER is s/p C3-4 laminectomy for decompression. C3-6 PSF  POD2 : Pt pain somewhat improved. Eager to go home. POD1: Pt experiencing significant neck pain this morning  Physical Exam: Vitals:   12/07/23 0327 12/07/23 0751  BP: (!) 142/80 (!) 141/72  Pulse: 82 84  Resp: 16 15  Temp: 97.8 F (36.6 C) 98.1 F (36.7 C)  SpO2: 97% 96%    AA Ox3 CNI   Strength: Side Biceps Triceps Deltoid Interossei Grip Wrist Ext. Wrist Flex.  R 4- 4- 3 4- 4 3 3   L 4+ 4+ 4+ 4+ 4+ 4- 4-   Output by Drain (mL) 12/05/23 0701 - 12/05/23 1900 12/05/23 1901 - 12/06/23 0700 12/06/23 0701 - 12/06/23 1900 12/06/23 1901 - 12/07/23 0700 12/07/23 0701 - 12/07/23 1002  Closed System Drain Right;Posterior Neck Accordion (Hemovac) 10 Fr. 55  70 50 5    Incision visualized, well appearing. No active drainage.  Data:  Other tests/results: NA  Assessment/Plan:  ELLISE KOVACK is a 63 y.o with cervical stenosis and myelopathy s/p decompression and fusion   - mobilize - pain control - DVT prophylaxis - PT/OT -Minimal drain output, Drain pulled. Prevena removed. Honeycomb dressing placed -D/C expected today to home with home health-patient refused SNF  Joan Flores  PA-C Department of Neurosurgery

## 2023-12-07 NOTE — Plan of Care (Signed)
  Problem: Education: Goal: Knowledge of General Education information will improve Description: Including pain rating scale, medication(s)/side effects and non-pharmacologic comfort measures Outcome: Progressing   Problem: Education: Goal: Ability to verbalize activity precautions or restrictions will improve Outcome: Progressing   Problem: Clinical Measurements: Goal: Ability to maintain clinical measurements within normal limits will improve Outcome: Progressing   Problem: Skin Integrity: Goal: Will show signs of wound healing Outcome: Progressing

## 2023-12-13 ENCOUNTER — Telehealth: Payer: Self-pay | Admitting: Neurosurgery

## 2023-12-13 NOTE — Telephone Encounter (Signed)
 post op C3-6 posterior spinal fusion, C3-4 laminectomy on 12/05/23   Patient is calling to request a refill of Oxycodone HCL 15mg  [facility] [address]

## 2023-12-14 ENCOUNTER — Other Ambulatory Visit: Payer: Self-pay | Admitting: Physician Assistant

## 2023-12-14 MED ORDER — OXYCODONE HCL 15 MG PO TABS: 15.0000 mg | ORAL_TABLET | ORAL | 0 refills | Status: DC | PRN

## 2023-12-14 NOTE — Telephone Encounter (Signed)
 Patient is calling back to follow up on her medication request from yesterday. She states that she is out of the medication. Please advise.

## 2023-12-14 NOTE — Progress Notes (Signed)
 Refill Oxycodone sent

## 2023-12-18 ENCOUNTER — Telehealth: Payer: Self-pay | Admitting: Neurosurgery

## 2023-12-18 NOTE — Telephone Encounter (Signed)
  Media Information   Document Information     Media Information   Document Information  C3-6 posterior spinal fusion, C3-4 laminectomy on 12/05/23

## 2023-12-21 ENCOUNTER — Ambulatory Visit (INDEPENDENT_AMBULATORY_CARE_PROVIDER_SITE_OTHER): Admitting: Neurosurgery

## 2023-12-21 ENCOUNTER — Encounter: Payer: Self-pay | Admitting: Neurosurgery

## 2023-12-21 VITALS — BP 108/62 | Temp 98.9°F | Ht 62.0 in | Wt 144.0 lb

## 2023-12-21 DIAGNOSIS — R29898 Other symptoms and signs involving the musculoskeletal system: Secondary | ICD-10-CM

## 2023-12-21 DIAGNOSIS — Z981 Arthrodesis status: Secondary | ICD-10-CM

## 2023-12-21 DIAGNOSIS — M4712 Other spondylosis with myelopathy, cervical region: Secondary | ICD-10-CM

## 2023-12-21 NOTE — Progress Notes (Signed)
   REFERRING PHYSICIAN:  Yehuda Helms, Md [address] [facility] [place],  [place] [address]  DOS: 12/05/23 C3-6 PSF. C3-4 laminectomy   HISTORY OF PRESENT ILLNESS: Sabrina Stanley is about 2 weeks status post cervical decompression and fusion. Overall, she is doing well post-operatively despite some incision pain. She continues to have numbness in her hands worse on the left than the right  PHYSICAL EXAMINATION:  NEUROLOGICAL:  General: In no acute distress.   Awake, alert, oriented to person, place, and time.  Pupils equal round and reactive to light.    Strength:  Side Biceps Triceps Deltoid Interossei Grip Wrist Ext. Wrist Flex.  R 4- 4- 4+ 4- 4 - (patient in wrist splint) -  L 4+ 4+ 4+ 4+ 4+ 4- 4-   Incision c/d/I  Imaging:  No interval imaging to review  Assessment / Plan: Sabrina Stanley is doing well after 2 weeks. She is almost out of Oxycodone  but plans to resume her home Morphine  and Percocet when she runs out. We discussed activity escalation and I have advised the patient to lift up to 10 pounds until 6 weeks after surgery, then increase up to 25 pounds until 12 weeks after surgery.  After 12 weeks post-op, the patient advised to increase activity as tolerated. she will return to clinic in approximately 4 weeks to see Dr. Felipe Horton with cervical xrays prior.  Advised to contact the office if any questions or concerns arise.   Anastacio Karvonen PA-C Dept of Neurosurgery

## 2023-12-22 ENCOUNTER — Other Ambulatory Visit: Payer: Self-pay | Admitting: Neurosurgery

## 2023-12-22 MED ORDER — OXYCODONE HCL 15 MG PO TABS: 15.0000 mg | ORAL_TABLET | Freq: Four times a day (QID) | ORAL | 0 refills | Status: AC | PRN

## 2023-12-22 NOTE — Telephone Encounter (Signed)
 Notifed patient. She indicates understanding that her pain medication refills should come from pain clinic after this refill.

## 2023-12-22 NOTE — Telephone Encounter (Signed)
 C3-6 posterior spinal fusion, C3-4 laminectomy on 12/05/23  Patient is calling that she is still having burning sensation at her surgery site. She was seen yesterday and she told Diego Foy she was going to take her percocet at home for the pain. She is calling that her percocet is not touching the pain. Could you call in 1 more refill of oxycodone  sent to [facility] in [place].

## 2023-12-22 NOTE — Telephone Encounter (Signed)
 Patient has burning sensation down her neck down the left side into the L shoulder.   She does not feel her percocet is effective as the Roxicodone . I explained that these medications are similar except the percocet has tylenol . She said she got better relief from the oxycodone  so would like a script if possible.  Can we give her Roxicodone  and or add some gabapentin?

## 2023-12-22 NOTE — Progress Notes (Signed)
 PDMP reviewed and appropriate. Refill of Oxycodone  15mg  at reduced frequency of TID was sent.

## 2024-01-03 ENCOUNTER — Other Ambulatory Visit: Payer: Self-pay | Admitting: Acute Care

## 2024-01-03 DIAGNOSIS — Z87891 Personal history of nicotine dependence: Secondary | ICD-10-CM

## 2024-01-03 DIAGNOSIS — F17210 Nicotine dependence, cigarettes, uncomplicated: Secondary | ICD-10-CM

## 2024-01-03 DIAGNOSIS — Z122 Encounter for screening for malignant neoplasm of respiratory organs: Secondary | ICD-10-CM

## 2024-01-12 ENCOUNTER — Other Ambulatory Visit: Payer: Self-pay | Admitting: Internal Medicine

## 2024-01-12 DIAGNOSIS — Z1231 Encounter for screening mammogram for malignant neoplasm of breast: Secondary | ICD-10-CM

## 2024-01-16 ENCOUNTER — Other Ambulatory Visit: Payer: Self-pay

## 2024-01-16 DIAGNOSIS — R29898 Other symptoms and signs involving the musculoskeletal system: Secondary | ICD-10-CM

## 2024-01-17 ENCOUNTER — Ambulatory Visit
Admission: RE | Admit: 2024-01-17 | Discharge: 2024-01-17 | Disposition: A | Discharge status: Home / Self Care | Attending: Neurosurgery | Admitting: Neurosurgery

## 2024-01-17 ENCOUNTER — Ambulatory Visit (INDEPENDENT_AMBULATORY_CARE_PROVIDER_SITE_OTHER): Admitting: Physician Assistant

## 2024-01-17 ENCOUNTER — Ambulatory Visit
Admission: RE | Admit: 2024-01-17 | Discharge: 2024-01-17 | Disposition: A | Discharge status: Home / Self Care | Source: Ambulatory Visit | Attending: Neurosurgery | Admitting: Neurosurgery

## 2024-01-17 ENCOUNTER — Encounter: Payer: Self-pay | Admitting: Physician Assistant

## 2024-01-17 VITALS — BP 114/74 | Temp 98.4°F | Ht 62.0 in | Wt 144.0 lb

## 2024-01-17 DIAGNOSIS — R29898 Other symptoms and signs involving the musculoskeletal system: Secondary | ICD-10-CM

## 2024-01-17 DIAGNOSIS — M4802 Spinal stenosis, cervical region: Secondary | ICD-10-CM

## 2024-01-17 DIAGNOSIS — Z981 Arthrodesis status: Secondary | ICD-10-CM

## 2024-01-17 DIAGNOSIS — M5031 Other cervical disc degeneration,  high cervical region: Secondary | ICD-10-CM

## 2024-01-17 NOTE — Progress Notes (Unsigned)
   REFERRING PHYSICIAN:  Yehuda Helms, Md [address] [facility] [place],  [place] [address]  DOS: 12/05/23 C3-6 PSF. C3-4 laminectomy   HISTORY OF PRESENT ILLNESS: Sabrina Stanley is about 6 weeks status post cervical decompression and fusion. Overall, she is doing okay.  She states she has had some popping in her neck since surgery.  She states that initially she was having pain as well as numbness and tingling in her thumb pointer finger of her left hand however now she feels as though it is in all 5 of her fingers and encompasses both upper extremities.  She adds that her numbness and tingling is worse since surgery.  She states that she has been smoking a lot since surgery secondary to stress.  She also notices that she is weaker because sometimes she drops the cigarette from her hands.  She feels as though she is more off balance and more unsteady.  She denies any saddle anesthesia or issues with incontinence of bowel or bladder  PHYSICAL EXAMINATION:  NEUROLOGICAL:  General: In no acute distress.   Awake, alert, oriented to person, place, and time.  Pupils equal round and reactive to light.    Strength:  Side Biceps Triceps Deltoid Interossei Grip Wrist Ext. Wrist Flex.  R 4- 4- 4+ 4- 4 4- -  L 4+ 4+ 4+ 4+ 4+ 4- 4-   Incision c/d/I  Imaging:  X-rays completed today reviewed which do not show any hardware failure.  Assessment / Plan: Sabrina Stanley is about 6 weeks status post cervical decompression and fusion. Overall, she is doing okay.  She states she has had some popping in her neck since surgery.  She states that initially she was having pain as well as numbness and tingling in her thumb pointer finger of her left hand however now she feels as though it is in all 5 of her fingers and encompasses both upper extremities.  She adds that her numbness and tingling is worse since surgery.  She states that she has been smoking a lot since surgery secondary to stress.  She  also notices that she is weaker because sometimes she drops the cigarette from her hands.  She feels as though she is more off balance and more unsteady.  She denies any saddle anesthesia or issues with incontinence of bowel or bladder examination is to baseline.  Incision is well-appearing.  X-rays reviewed which did not show any hardware failure, but will await formal radiology read.We discussed activity escalation and I have advised the patient to lift up to 10 pounds until 6 weeks after surgery, then increase up to 25 pounds until 12 weeks after surgery.  After 12 weeks post-op, the patient advised to increase activity as tolerated. she will return to clinic in approximately 6 weeks to see Dr. Felipe Horton with cervical xrays prior.  Unfortunately patient is unable to take neuropathic pain medicine like gabapentin or Lyrica.  Would like patient to undergo a CT myelogram of her cervical spine for further evaluation considering worsening symptoms.  Will review results once completed and contact patient regarding them.  Red flag symptoms reviewed with the patient we will discuss further once complete.  Advised patient to consider smoking cessation.  Advised to contact the office if any questions or concerns arise.   Ludwig Safer PA-C Dept of Neurosurgery

## 2024-01-23 ENCOUNTER — Ambulatory Visit: Admission: RE | Admit: 2024-01-23 | Source: Ambulatory Visit

## 2024-01-25 NOTE — Progress Notes (Signed)
 Patient for [facility] Cervical Myelogram/CT Cervical Myelogram on Friday 01/26/24, I called and spoke with the patient on the phone and gave pre-procedure instructions. Pt was made aware to be here at 9:30a and check in at the new entrance. Pt stated understanding.  Called 01/24/24

## 2024-01-26 ENCOUNTER — Ambulatory Visit
Admission: RE | Admit: 2024-01-26 | Discharge: 2024-01-26 | Disposition: A | Discharge status: Home / Self Care | Source: Ambulatory Visit | Attending: Physician Assistant | Admitting: Physician Assistant

## 2024-01-26 ENCOUNTER — Other Ambulatory Visit

## 2024-01-26 ENCOUNTER — Other Ambulatory Visit: Payer: Self-pay

## 2024-01-26 DIAGNOSIS — M5031 Other cervical disc degeneration,  high cervical region: Secondary | ICD-10-CM | POA: Insufficient documentation

## 2024-01-26 DIAGNOSIS — Z538 Procedure and treatment not carried out for other reasons: Secondary | ICD-10-CM | POA: Diagnosis not present

## 2024-01-26 DIAGNOSIS — R29898 Other symptoms and signs involving the musculoskeletal system: Secondary | ICD-10-CM | POA: Diagnosis present

## 2024-01-26 DIAGNOSIS — M4802 Spinal stenosis, cervical region: Secondary | ICD-10-CM | POA: Diagnosis not present

## 2024-01-26 DIAGNOSIS — Z981 Arthrodesis status: Secondary | ICD-10-CM

## 2024-01-26 MED ORDER — LIDOCAINE HCL (PF) 1 % IJ SOLN
10.0000 mL | Freq: Once | INTRAMUSCULAR | Status: AC
Start: 2024-01-26 — End: 2024-01-26
  Administered 2024-01-26: 10 mL via INTRADERMAL
  Filled 2024-01-26: qty 10

## 2024-01-26 MED ORDER — LIDOCAINE HCL (PF) 1 % IJ SOLN
5.0000 mL | Freq: Once | INTRAMUSCULAR | Status: AC
  Administered 2024-01-26: 5 mL
  Filled 2024-01-26: qty 5

## 2024-01-26 MED ORDER — LIDOCAINE HCL (PF) 1 % IJ SOLN
5.0000 mL | Freq: Once | INTRAMUSCULAR | Status: AC
Start: 2024-01-26 — End: 2024-01-26
  Administered 2024-01-26: 5 mL

## 2024-01-26 MED ORDER — ONDANSETRON HCL 4 MG/2ML IJ SOLN: 4.0000 mg | Freq: Four times a day (QID) | INTRAMUSCULAR | Status: DC | PRN

## 2024-01-26 MED ORDER — IOHEXOL 300 MG/ML  SOLN
10.0000 mL | Freq: Once | INTRAMUSCULAR | Status: AC | PRN
  Administered 2024-01-26: 10 mL

## 2024-01-26 NOTE — OR Nursing (Signed)
 Pt returned from second myelogram attempt. Pain 9/10 despite taking home po pain meds while in radiology. Dressing mid lower back dry and intact.

## 2024-01-26 NOTE — OR Nursing (Signed)
 Darvin England called to pick patient up at 4:30 pm in front of [facility].

## 2024-01-29 ENCOUNTER — Telehealth: Payer: Self-pay

## 2024-01-29 ENCOUNTER — Other Ambulatory Visit: Payer: Self-pay | Admitting: Neurosurgery

## 2024-01-29 DIAGNOSIS — Z981 Arthrodesis status: Secondary | ICD-10-CM

## 2024-01-29 DIAGNOSIS — R29898 Other symptoms and signs involving the musculoskeletal system: Secondary | ICD-10-CM

## 2024-01-29 DIAGNOSIS — M4802 Spinal stenosis, cervical region: Secondary | ICD-10-CM

## 2024-01-29 NOTE — Telephone Encounter (Signed)
 Leary Provencal called stating they attempted twice to do the myelogram in fluoro, but was unsuccessful. Dr Marne Sings is going to bring her back Wednesday to the IR lab to try again at no charge. They are going to send a new order for cosign.

## 2024-01-29 NOTE — Telephone Encounter (Signed)
 Noted. I can sign order since Ruthann Cover is out of the office.

## 2024-01-30 NOTE — Progress Notes (Signed)
 Patient for IR Cervical Myelogram/CT Myelogram on Wed 01/31/24, I called and spoke with the patient on the phone and gave pre-procedure instructions. Pt was made aware to be here at 1:30p and check in at the new entrance. Pt stated understanding.  Called 01/30/24 3rd attempt - no charge to the patient per Dr Fernando Hoyer, MD

## 2024-01-30 NOTE — Telephone Encounter (Signed)
 Patient is having her myelogram tomorrow. This will be the 3rd time they are going to try it.  Could someone call her in a valium  to relax her? [facility]

## 2024-01-30 NOTE — Telephone Encounter (Signed)
 Please let her know I spoke with Dr. Marne Sings in radiology and they will be able to give her something to relax her when she gets there for the procedure.

## 2024-01-31 ENCOUNTER — Ambulatory Visit
Admission: RE | Admit: 2024-01-31 | Discharge: 2024-01-31 | Disposition: A | Discharge status: Home / Self Care | Source: Ambulatory Visit | Attending: Neurosurgery | Admitting: Neurosurgery

## 2024-01-31 ENCOUNTER — Other Ambulatory Visit: Payer: Self-pay | Admitting: Neurosurgery

## 2024-01-31 ENCOUNTER — Encounter: Payer: Self-pay | Admitting: Radiology

## 2024-01-31 ENCOUNTER — Other Ambulatory Visit: Payer: Self-pay | Admitting: Radiology

## 2024-01-31 DIAGNOSIS — Z981 Arthrodesis status: Secondary | ICD-10-CM | POA: Diagnosis not present

## 2024-01-31 DIAGNOSIS — M5031 Other cervical disc degeneration,  high cervical region: Secondary | ICD-10-CM | POA: Diagnosis not present

## 2024-01-31 DIAGNOSIS — R29898 Other symptoms and signs involving the musculoskeletal system: Secondary | ICD-10-CM | POA: Diagnosis not present

## 2024-01-31 DIAGNOSIS — M4802 Spinal stenosis, cervical region: Secondary | ICD-10-CM | POA: Insufficient documentation

## 2024-01-31 MED ORDER — ACETAMINOPHEN 500 MG PO TABS
500.0000 mg | ORAL_TABLET | Freq: Four times a day (QID) | ORAL | Status: DC | PRN
  Administered 2024-01-31: 500 mg via ORAL
  Filled 2024-01-31 (×2): qty 1

## 2024-01-31 MED ORDER — LIDOCAINE HCL 1 % IJ SOLN
20.0000 mL | Freq: Once | INTRAMUSCULAR | Status: AC
  Administered 2024-01-31: 5 mL

## 2024-01-31 MED ORDER — IOHEXOL 300 MG/ML  SOLN
50.0000 mL | Freq: Once | INTRAMUSCULAR | Status: AC | PRN
  Administered 2024-01-31: 10 mL via INTRATHECAL

## 2024-01-31 MED ORDER — LIDOCAINE HCL (PF) 1 % IJ SOLN
INTRAMUSCULAR | Status: AC
  Filled 2024-01-31: qty 30

## 2024-01-31 MED ORDER — ACETAMINOPHEN 500 MG PO TABS
ORAL_TABLET | ORAL | Status: AC
  Filled 2024-01-31: qty 1

## 2024-01-31 MED ORDER — DIAZEPAM 5 MG PO TABS
5.0000 mg | ORAL_TABLET | Freq: Once | ORAL | Status: AC
  Administered 2024-01-31: 5 mg via ORAL
  Filled 2024-01-31: qty 1

## 2024-01-31 MED ORDER — DIAZEPAM 5 MG PO TABS: 5.0000 mg | ORAL_TABLET | Freq: Once | ORAL | Status: DC

## 2024-01-31 NOTE — Progress Notes (Signed)
 Called pt to make sure that she had someone picking her up after her myelogram because she will be receiving PO Valium . She stated that she does have someone to drive her home.

## 2024-01-31 NOTE — Telephone Encounter (Signed)
 Patient has been notified

## 2024-02-02 ENCOUNTER — Ambulatory Visit

## 2024-02-12 ENCOUNTER — Telehealth: Payer: Self-pay | Admitting: Neurosurgery

## 2024-02-12 NOTE — Telephone Encounter (Signed)
 I called reading room to get CT read. She is scheduled for 6/20 for results.

## 2024-02-12 NOTE — Telephone Encounter (Signed)
 Left message on patient's voicemail about appointment on Wednesday.

## 2024-02-12 NOTE — Telephone Encounter (Signed)
 C3-6 posterior spinal fusion, C3-4 laminectomy on 12/05/23   Patient stopped by the office to check on her myelogram results. I advised her that the results were taking anywhere from 2-3 weeks to come back. She states that she feels like her condition is getting worse as she is having trouble gripping thins, not able to pick things up, and does not have feeling in her hands when she is holding something. She also states that in the last few days she has about 10 pounds of fluid primarily in her legs and is feeling short of breath.

## 2024-02-14 ENCOUNTER — Ambulatory Visit (INDEPENDENT_AMBULATORY_CARE_PROVIDER_SITE_OTHER): Admitting: Neurosurgery

## 2024-02-14 ENCOUNTER — Encounter: Payer: Self-pay | Admitting: Neurosurgery

## 2024-02-14 VITALS — BP 126/60 | Ht 62.0 in | Wt 150.6 lb

## 2024-02-14 DIAGNOSIS — M4712 Other spondylosis with myelopathy, cervical region: Secondary | ICD-10-CM

## 2024-02-14 DIAGNOSIS — Z981 Arthrodesis status: Secondary | ICD-10-CM

## 2024-02-14 DIAGNOSIS — G894 Chronic pain syndrome: Secondary | ICD-10-CM

## 2024-02-14 DIAGNOSIS — R29898 Other symptoms and signs involving the musculoskeletal system: Secondary | ICD-10-CM

## 2024-02-14 DIAGNOSIS — M4802 Spinal stenosis, cervical region: Secondary | ICD-10-CM

## 2024-02-14 DIAGNOSIS — M5031 Other cervical disc degeneration,  high cervical region: Secondary | ICD-10-CM

## 2024-02-15 NOTE — Progress Notes (Signed)
 REFERRING PHYSICIAN:  Yehuda Helms, Md [address] [facility] [place],  [place] [address]  DOS: 12/05/23 C3-6 PSF. C3-4 laminectomy   HISTORY OF PRESENT ILLNESS: Sabrina Stanley is about 10 weeks status post cervical decompression and fusion.  She continues to numbness and tingling hands upper extremities.  Unfortunately continues to smoke despite recommendations to stop in the setting of her recent fusion.  She does not have any bowel or bladder incontinence.  She feels like her hands continue to have significant numbness and tingling.  She is here today to follow-up on her imaging.  PHYSICAL EXAMINATION:  NEUROLOGICAL:  General: In no acute distress.   Awake, alert, oriented to person, place, and time.  Pupils equal round and reactive to light.    Strength:  Side Biceps Triceps Deltoid Interossei Grip Wrist Ext. Wrist Flex.  R 4 4 4+ 4 4 4 4   L 4+ 4+ 4+ 4+ 4+ 4 4   Incision c/d/I  Imaging:  Narrative & Impression  CLINICAL DATA:  Bilateral arm weakness.   EXAM: CT CERVICAL SPINE WITH CONTRAST   TECHNIQUE: Multidetector CT imaging of the cervical spine was performed during intravenous contrast administration. Multiplanar CT image reconstructions were also generated.   RADIATION DOSE REDUCTION: This exam was performed according to the departmental dose-optimization program which includes automated exposure control, adjustment of the mA and/or kV according to patient size and/or use of iterative reconstruction technique.   CONTRAST:  10mL OMNIPAQUE  IOHEXOL  300 MG/ML  SOLN   COMPARISON:  Cervical myelogram 01/26/2024.   FINDINGS: Alignment: Straightening of the normal cervical lordosis. Trace retrolisthesis of C3 on C4 likely related to chronic degenerative changes. No facet subluxation or dislocation.   Skull base and vertebrae: Anterior cervical fusion hardware spanning C4-C5. There is mature osseous fusion from C4-C7. Posterior instrumented fusion  from C3-C6 with bilateral lateral mass screws and vertical interlocking rods. Hardware is intact. No significant perihardware lucency. Sequelae of posterior decompression from C3 to C5. No compression fracture or displaced fracture in the cervical spine. No destructive osseous lesion.   Soft tissues and spinal canal: Postsurgical changes of posterior paraspinal soft tissues in the upper and mid cervical spine. Paraspinal musculature otherwise unremarkable.   Disc levels:   C2-3: Disc bulge which indents the ventral thecal sac without contacting the spinal cord. No significant spinal canal stenosis. Bilateral facet arthrosis and uncovertebral hypertrophy. Mild bilateral foraminal stenosis.   C3-4: Trace retrolisthesis. Disc osteophyte complex which indents the ventral thecal sac. Posterior decompression. No significant spinal canal stenosis. Bilateral facet arthrosis and uncovertebral hypertrophy. Moderate bilateral foraminal stenosis slightly greater on the left.   C4-5: Postsurgical changes at this level. No significant spinal canal stenosis. Bilateral facet arthrosis. There is mild foraminal stenosis on the right.   C5-6: Postsurgical changes at this level. No significant spinal canal stenosis. Bilateral facet arthrosis. Mild uncovertebral hypertrophy. Mild foraminal stenosis on the left.   C6-7: Postsurgical changes at this level. No significant spinal canal stenosis. Bilateral facet arthrosis. No significant foraminal stenosis.   C7-T1: Small disc bulge. No significant spinal canal stenosis. Bilateral facet arthrosis. There is mild foraminal stenosis on the left.   Upper chest: No acute abnormality in the visualized lung apices.   Other: None.   IMPRESSION: Postsurgical changes of the cervical spine as above. Hardware is intact.   Degenerative changes throughout the cervical spine. Disc osteophyte complex at C3-4 indents the ventral thecal sac without  contacting the spinal cord. No  high-grade spinal canal stenosis.   Multilevel foraminal stenosis, greatest at C3-4.     Electronically Signed   By: Denny Flack M.D.   On: 02/13/2024 17:33        Assessment / Plan: Sabrina Stanley is about 10 weeks status post cervical decompression and fusion.  She continues to be concerned with her hand tingling.  We discussed again that one of the reasons why we are doing the surgery in the first place is that her tingling and burning sensation in her hands is getting worse secondary to her cervical MRI.  She was also unsteady and developing worsening weakness.  Unfortunately after a fall she had a disc herniation which was likely the reason for her progression.  With all of these factors we  recommended a cervical decompression as she likely had a spinal cord type injury after the fall.  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  I did discuss with her that patients with cord injuries, myelopathy and myelomalacia often have incomplete recoveries even in the setting of an adequate decompression. Fortunately she has had an improvement in her motor examination and motor strength. She'd like to have a second opinion, I offered to make a referral for her, but she'd like to seek this out on her own. I again restated the importance of smoking cessation for fusion promotion.    Carroll Clamp, MD Dept of Neurosurgery

## 2024-02-16 ENCOUNTER — Encounter: Payer: Self-pay | Admitting: Internal Medicine

## 2024-02-16 NOTE — Progress Notes (Deleted)
 REFERRING PHYSICIAN:  Yehuda Helms, Md [address] [facility] [place],  [place] [address]  DOS: 12/05/23 C3-6 PSF. C3-4 laminectomy   HISTORY OF PRESENT ILLNESS:  She continued with numbness and tingling in her hands at her last visit. She was still smoking.   Imaging showed   GREY SCHLAUCH is about 10 weeks status post cervical decompression and fusion.  She continues to numbness and tingling hands upper extremities.  Unfortunately continues to smoke despite recommendations to stop in the setting of her recent fusion.  She does not have any bowel or bladder incontinence.  She feels like her hands continue to have significant numbness and tingling.  She is here today to follow-up on her imaging.  PHYSICAL EXAMINATION:  NEUROLOGICAL:  General: In no acute distress.   Awake, alert, oriented to person, place, and time.  Pupils equal round and reactive to light.    Strength:  Side Biceps Triceps Deltoid Interossei Grip Wrist Ext. Wrist Flex.  R 4 4 4+ 4 4 4 4   L 4+ 4+ 4+ 4+ 4+ 4 4   Incision c/d/I  Imaging:  Narrative & Impression  CLINICAL DATA:  Bilateral arm weakness.   EXAM: CT CERVICAL SPINE WITH CONTRAST   TECHNIQUE: Multidetector CT imaging of the cervical spine was performed during intravenous contrast administration. Multiplanar CT image reconstructions were also generated.   RADIATION DOSE REDUCTION: This exam was performed according to the departmental dose-optimization program which includes automated exposure control, adjustment of the mA and/or kV according to patient size and/or use of iterative reconstruction technique.   CONTRAST:  10mL OMNIPAQUE  IOHEXOL  300 MG/ML  SOLN   COMPARISON:  Cervical myelogram 01/26/2024.   FINDINGS: Alignment: Straightening of the normal cervical lordosis. Trace retrolisthesis of C3 on C4 likely related to chronic degenerative changes. No facet subluxation or dislocation.   Skull base and vertebrae:  Anterior cervical fusion hardware spanning C4-C5. There is mature osseous fusion from C4-C7. Posterior instrumented fusion from C3-C6 with bilateral lateral mass screws and vertical interlocking rods. Hardware is intact. No significant perihardware lucency. Sequelae of posterior decompression from C3 to C5. No compression fracture or displaced fracture in the cervical spine. No destructive osseous lesion.   Soft tissues and spinal canal: Postsurgical changes of posterior paraspinal soft tissues in the upper and mid cervical spine. Paraspinal musculature otherwise unremarkable.   Disc levels:   C2-3: Disc bulge which indents the ventral thecal sac without contacting the spinal cord. No significant spinal canal stenosis. Bilateral facet arthrosis and uncovertebral hypertrophy. Mild bilateral foraminal stenosis.   C3-4: Trace retrolisthesis. Disc osteophyte complex which indents the ventral thecal sac. Posterior decompression. No significant spinal canal stenosis. Bilateral facet arthrosis and uncovertebral hypertrophy. Moderate bilateral foraminal stenosis slightly greater on the left.   C4-5: Postsurgical changes at this level. No significant spinal canal stenosis. Bilateral facet arthrosis. There is mild foraminal stenosis on the right.   C5-6: Postsurgical changes at this level. No significant spinal canal stenosis. Bilateral facet arthrosis. Mild uncovertebral hypertrophy. Mild foraminal stenosis on the left.   C6-7: Postsurgical changes at this level. No significant spinal canal stenosis. Bilateral facet arthrosis. No significant foraminal stenosis.   C7-T1: Small disc bulge. No significant spinal canal stenosis. Bilateral facet arthrosis. There is mild foraminal stenosis on the left.   Upper chest: No acute abnormality in the visualized lung apices.   Other: None.   IMPRESSION: Postsurgical changes of the cervical spine as above. Hardware is intact.  Degenerative  changes throughout the cervical spine. Disc osteophyte complex at C3-4 indents the ventral thecal sac without contacting the spinal cord. No high-grade spinal canal stenosis.   Multilevel foraminal stenosis, greatest at C3-4.     Electronically Signed   By: Denny Flack M.D.   On: 02/13/2024 17:33        Assessment / Plan: Sabrina Stanley is about 10 weeks status post cervical decompression and fusion.  She continues to be concerned with her hand tingling.  We discussed again that one of the reasons why we are doing the surgery in the first place is that her tingling and burning sensation in her hands is getting worse secondary to her cervical MRI.  She was also unsteady and developing worsening weakness.  Unfortunately after a fall she had a disc herniation which was likely the reason for her progression.  With all of these factors we  recommended a cervical decompression as she likely had a spinal cord type injury after the fall.  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  I did discuss with her that patients with cord injuries, myelopathy and myelomalacia often have incomplete recoveries even in the setting of an adequate decompression. Fortunately she has had an improvement in her motor examination and motor strength. She'd like to have a second opinion, I offered to make a referral for her, but she'd like to seek this out on her own. I again restated the importance of smoking cessation for fusion promotion.    Carroll Clamp, MD Dept of Neurosurgery

## 2024-02-20 ENCOUNTER — Telehealth: Payer: Self-pay | Admitting: Neurosurgery

## 2024-02-20 DIAGNOSIS — M4802 Spinal stenosis, cervical region: Secondary | ICD-10-CM

## 2024-02-20 DIAGNOSIS — R29898 Other symptoms and signs involving the musculoskeletal system: Secondary | ICD-10-CM

## 2024-02-20 DIAGNOSIS — M5031 Other cervical disc degeneration,  high cervical region: Secondary | ICD-10-CM

## 2024-02-20 DIAGNOSIS — G894 Chronic pain syndrome: Secondary | ICD-10-CM

## 2024-02-20 NOTE — Telephone Encounter (Signed)
 Referral has been placed.

## 2024-02-20 NOTE — Telephone Encounter (Signed)
 Patient came by the office stating that Dr.Smith was supposed to place a referral to Dr.Kyle Cabbell at [facility] but she did not hear back about that wanting to follow up. Please advise.

## 2024-02-20 NOTE — Telephone Encounter (Signed)
 Dr Theressa last note says She'd like to have a second opinion, I offered to make a referral for her, but she'd like to seek this out on her own.

## 2024-02-21 NOTE — Telephone Encounter (Signed)
 Patient notified

## 2024-02-26 ENCOUNTER — Encounter: Admitting: Orthopedic Surgery

## 2024-03-05 ENCOUNTER — Ambulatory Visit: Admission: RE | Admit: 2024-03-05 | Source: Home / Self Care | Admitting: Internal Medicine

## 2024-03-05 HISTORY — DX: Other intervertebral disc degeneration, lumbar region without mention of lumbar back pain or lower extremity pain: M51.369

## 2024-03-05 HISTORY — DX: Spondylosis without myelopathy or radiculopathy, lumbar region: M47.816

## 2024-03-05 HISTORY — DX: Sacrococcygeal disorders, not elsewhere classified: M53.3

## 2024-03-05 HISTORY — DX: Lymphedema, not elsewhere classified: I89.0

## 2024-03-05 HISTORY — DX: Deficiency of other specified B group vitamins: E53.8

## 2024-03-05 HISTORY — DX: Drug-induced tremor: G25.1

## 2024-03-05 HISTORY — DX: Other hereditary and idiopathic neuropathies: G60.8

## 2024-03-05 HISTORY — DX: Atherosclerosis of native arteries of extremities with intermittent claudication, unspecified extremity: I70.219

## 2024-03-05 HISTORY — DX: Venous insufficiency (chronic) (peripheral): I87.2

## 2024-03-05 SURGERY — EGD (ESOPHAGOGASTRODUODENOSCOPY)
Anesthesia: General

## 2024-04-15 NOTE — Addendum Note (Signed)
 Encounter addended by: Mackovic, Aidric Endicott N on: 04/15/2024 3:52 PM  Actions taken: Imaging Exam ended

## 2024-06-20 ENCOUNTER — Ambulatory Visit
Admission: RE | Admit: 2024-06-20 | Discharge: 2024-06-20 | Disposition: A | Discharge status: Home / Self Care | Source: Ambulatory Visit | Attending: Internal Medicine | Admitting: Internal Medicine

## 2024-06-20 DIAGNOSIS — Z1231 Encounter for screening mammogram for malignant neoplasm of breast: Secondary | ICD-10-CM | POA: Insufficient documentation

## 2024-06-26 ENCOUNTER — Encounter: Payer: Self-pay | Admitting: Internal Medicine

## 2024-07-02 ENCOUNTER — Encounter: Payer: Self-pay | Admitting: Internal Medicine

## 2024-07-02 ENCOUNTER — Other Ambulatory Visit: Payer: Self-pay

## 2024-07-02 ENCOUNTER — Encounter
Admission: RE | Disposition: A | Discharge status: Home / Self Care | Payer: Self-pay | Source: Home / Self Care | Attending: Internal Medicine

## 2024-07-02 ENCOUNTER — Ambulatory Visit: Admitting: Registered Nurse

## 2024-07-02 ENCOUNTER — Ambulatory Visit
Admission: RE | Admit: 2024-07-02 | Discharge: 2024-07-02 | Disposition: A | Discharge status: Home / Self Care | Attending: Internal Medicine | Admitting: Internal Medicine

## 2024-07-02 HISTORY — PX: ESOPHAGEAL DILATION: SHX303

## 2024-07-02 HISTORY — PX: ESOPHAGOGASTRODUODENOSCOPY: SHX5428

## 2024-07-02 SURGERY — EGD (ESOPHAGOGASTRODUODENOSCOPY)
Anesthesia: General

## 2024-07-02 MED ORDER — DEXMEDETOMIDINE HCL IN NACL 80 MCG/20ML IV SOLN
INTRAVENOUS | Status: DC | PRN
  Administered 2024-07-02: 4 ug via INTRAVENOUS
  Administered 2024-07-02: 8 ug via INTRAVENOUS
  Administered 2024-07-02: 4 ug via INTRAVENOUS

## 2024-07-02 MED ORDER — PROPOFOL 10 MG/ML IV BOLUS
INTRAVENOUS | Status: DC | PRN
  Administered 2024-07-02: 30 mg via INTRAVENOUS
  Administered 2024-07-02: 70 mg via INTRAVENOUS

## 2024-07-02 MED ORDER — SODIUM CHLORIDE 0.9 % IV SOLN: INTRAVENOUS | Status: DC

## 2024-07-02 MED ORDER — LIDOCAINE HCL (CARDIAC) PF 100 MG/5ML IV SOSY
PREFILLED_SYRINGE | INTRAVENOUS | Status: DC | PRN
  Administered 2024-07-02: 80 mg via INTRAVENOUS

## 2024-07-02 NOTE — Interval H&P Note (Signed)
 History and Physical Interval Note:  07/02/2024 10:21 AM  Sabrina Stanley  has presented today for surgery, with the diagnosis of Gastroesophageal reflux disease, unspecified whether esophagitis present (K21.9) Dysphagia, unspecified type (R13.10).  The various methods of treatment have been discussed with the patient and family. After consideration of risks, benefits and other options for treatment, the patient has consented to  Procedure(s): EGD (ESOPHAGOGASTRODUODENOSCOPY) (N/A) as a surgical intervention.  The patient's history has been reviewed, patient examined, no change in status, stable for surgery.  I have reviewed the patient's chart and labs.  Questions were answered to the patient's satisfaction.     [place], Sabrina Stanley

## 2024-07-02 NOTE — Anesthesia Procedure Notes (Signed)
 Procedure Name: MAC Date/Time: 07/02/2024 10:35 AM  Performed by: Lorrene Camelia LABOR, CRNAPre-anesthesia Checklist: Patient identified, Emergency Drugs available, Suction available and Patient being monitored Patient Re-evaluated:Patient Re-evaluated prior to induction Oxygen Delivery Method: Simple face mask Preoxygenation: Pre-oxygenation with 100% oxygen Induction Type: IV induction Comments: pom

## 2024-07-02 NOTE — Op Note (Signed)
 [facility] Patient Name: Sabrina Stanley Procedure Date: 07/02/2024 10:24 AM MRN: [id number] Account #: [id number] Date of Birth: February 27, 1961 Admit Type: Outpatient Age: 63 Room: [facility] ENDO ROOM 3 Gender: Female Note Status: Finalized Instrument Name: Barnie GI Scope [id number] Procedure:             Upper GI endoscopy Indications:           Pharyngeal phase dysphagia, Esophageal dysphagia Providers:             Genni Buske K. Aundria MD, MD Referring MD:          Reyes BIRCH. Auston, MD (Referring MD) Medicines:             Propofol  per Anesthesia Complications:         No immediate complications. Estimated blood loss:                         Minimal. Procedure:             Pre-Anesthesia Assessment:                        - The risks and benefits of the procedure and the                         sedation options and risks were discussed with the                         patient. All questions were answered and informed                         consent was obtained.                        - Patient identification and proposed procedure were                         verified prior to the procedure by the nurse. The                         procedure was verified in the procedure room.                        - ASA Grade Assessment: III - A patient with severe                         systemic disease.                        - After reviewing the risks and benefits, the patient                         was deemed in satisfactory condition to undergo the                         procedure.                        After obtaining informed consent, the endoscope was  passed under direct vision. Throughout the procedure,                         the patient's blood pressure, pulse, and oxygen                         saturations were monitored continuously. The Endoscope                         was introduced through the mouth, and advanced to the                          third part of duodenum. The upper GI endoscopy was                         accomplished without difficulty. The patient tolerated                         the procedure well. Findings:      Scattered mild inflammation characterized by linear erosions was found       in the entire esophagus. Biopsies were obtained from the proximal and       distal esophagus with cold forceps for histology of suspected       eosinophilic esophagitis. Estimated blood loss was minimal.      One benign-appearing, intrinsic mild stenosis was found in the distal       esophagus. This stenosis measured 1.4 cm (inner diameter) x less than       one cm (in length). The stenosis was traversed. The scope was withdrawn.       Dilation was performed with a Maloney dilator with moderate resistance       at 54 Fr. Estimated blood loss was minimal.      Scattered mild inflammation characterized by congestion (edema) and       erythema was found in the gastric antrum. Estimated blood loss: none.      A 1 cm hiatal hernia was present.      The examined duodenum was normal. Impression:            - Esophageal mucosal changes were present, including                         linear erosions. Findings are suggestive of reflux                         inflammation.                        - Benign-appearing esophageal stenosis. Dilated.                        - Gastritis.                        - 1 cm hiatal hernia.                        - Normal examined duodenum.                        - Biopsies were taken with a cold forceps for  evaluation of eosinophilic esophagitis. Recommendation:        - Patient has a contact number available for                         emergencies. The signs and symptoms of potential                         delayed complications were discussed with the patient.                         Return to normal activities tomorrow. Written                          discharge instructions were provided to the patient.                        - Resume previous diet.                        - Continue present medications.                        - Await pathology results.                        - Follow up with Romero Antigua, PA-C at [facility]. [phone].                        - Telephone GI office to schedule appointment in 3                         months.                        - The findings and recommendations were discussed with                         the patient. Procedure Code(s):     --- Professional ---                        [phone], Esophagogastroduodenoscopy, flexible,                         transoral; with biopsy, single or multiple                        43450, Dilation of esophagus, by unguided sound or                         bougie, single or multiple passes Diagnosis Code(s):     --- Professional ---                        R13.14, Dysphagia, pharyngoesophageal phase                        R13.13, Dysphagia, pharyngeal phase  K44.9, Diaphragmatic hernia without obstruction or                         gangrene                        K29.70, Gastritis, unspecified, without bleeding                        K22.2, Esophageal obstruction                        K22.10, Ulcer of esophagus without bleeding CPT copyright 2022 American Medical Association. All rights reserved. The codes documented in this report are preliminary and upon coder review may  be revised to meet current compliance requirements. Ladell MARLA Boss MD, MD 07/02/2024 10:51:42 AM This report has been signed electronically. Number of Addenda: 0 Note Initiated On: 07/02/2024 10:24 AM Estimated Blood Loss:  Estimated blood loss was minimal.      [facility]

## 2024-07-02 NOTE — Transfer of Care (Signed)
 Immediate Anesthesia Transfer of Care Note  Patient: Sabrina Stanley  Procedure(s) Performed: EGD (ESOPHAGOGASTRODUODENOSCOPY) DILATION, ESOPHAGUS  Patient Location: Endoscopy Unit  Anesthesia Type:General  Level of Consciousness: drowsy  Airway & Oxygen Therapy: Patient Spontanous Breathing  Post-op Assessment: Report given to RN and Post -op Vital signs reviewed and stable  Post vital signs: Reviewed and stable  Last Vitals:  Vitals Value Taken Time  BP 106/58 07/02/24 10:55  Temp 36 C 07/02/24 10:55  Pulse 65 07/02/24 10:55  Resp 12 07/02/24 10:55  SpO2 100 % 07/02/24 10:55    Last Pain:  Vitals:   07/02/24 1055  TempSrc: Tympanic  PainSc:          Complications: No notable events documented.

## 2024-07-02 NOTE — Anesthesia Postprocedure Evaluation (Signed)
 Anesthesia Post Note  Patient: Sabrina Stanley  Procedure(s) Performed: EGD (ESOPHAGOGASTRODUODENOSCOPY) DILATION, ESOPHAGUS  Patient location during evaluation: PACU Anesthesia Type: General Level of consciousness: awake and alert Pain management: pain level controlled Vital Signs Assessment: post-procedure vital signs reviewed and stable Respiratory status: spontaneous breathing, nonlabored ventilation, respiratory function stable and patient connected to nasal cannula oxygen Cardiovascular status: blood pressure returned to baseline and stable Postop Assessment: no apparent nausea or vomiting Anesthetic complications: no   No notable events documented.   Last Vitals:  Vitals:   07/02/24 1113 07/02/24 1116  BP: 128/65 128/65  Pulse: 71 67  Resp: 15 13  Temp:    SpO2: 100% 100%    Last Pain:  Vitals:   07/02/24 1116  TempSrc:   PainSc: 1                  [place] KANDICE Clause

## 2024-07-02 NOTE — H&P (Signed)
 Outpatient short stay form Pre-procedure 07/02/2024 10:20 AM Sabrina Stanley, M.D.  Primary Physician: Reyes Costa, M.D.  Reason for visit:  GERD, dysphagia  History of present illness:  Sabrina Stanley reports having worse issues with reflux. She also describes episodes of dysphagia. She feels that sometimes capsules as well as food can stick in her mid esophageal area. They can cause significant esophageal discomfort and caused her to have a sensation that she needs to throw up. Sometimes she will have to regurgitate the pills. Occasionally she avoids taking the Medications Due To the Symptoms. Can Also Have Dysphagia to Foods That Are Spicy but Is Less Often. Very Rare Liquids Dysphagia. Does Take Tums As Needed and This Seems to Help Some If Having Esophageal Burning. She Feels That She Takes Nexium Each Morning and Pepcid  at Night. Has Occasional Nausea but Denies Vomiting. She Does Take Zofran  As Needed Which Seems to Help Nausea.  She generally takes senna most days and has a bowel movement most days. She does find she is sensitive to dairy and tries to avoid. She recently had to have a course of roxicodone  and this caused severe constipation. She denies any rectal bleeding or melena. No lower abdominal pain.  Smokes 1 pack/day. Denies alcohol or NSAIDs.     Current Facility-Administered Medications:    0.9 %  sodium chloride  infusion, , Intravenous, Continuous, Phinneas Shakoor K, MD  Medications Prior to Admission  Medication Sig Dispense Refill Last Dose/Taking   albuterol  (PROVENTIL ) (2.5 MG/3ML) 0.083% nebulizer solution Take 2.5 mg by nebulization every 6 (six) hours as needed for wheezing or shortness of breath.   Past Week   albuterol  (VENTOLIN  HFA) 108 (90 Base) MCG/ACT inhaler Inhale 2 puffs into the lungs every 6 (six) hours as needed for shortness of breath or wheezing.   Past Week   atorvastatin  (LIPITOR) 10 MG tablet    07/01/2024   Biotin  10000 MCG TABS Take 1 tablet by  mouth daily.   Past Week   bisoprolol -hydrochlorothiazide  (ZIAC ) 2.5-6.25 MG tablet Take 1 tablet by mouth daily.   07/01/2024   buPROPion  (WELLBUTRIN ) 100 MG tablet Take 100 mg by mouth daily.   07/01/2024   cetirizine (ZYRTEC) 10 MG chewable tablet Chew 10 mg by mouth daily.   07/01/2024   Cholecalciferol  25 MCG (1000 UT) capsule Take 1,000 Units by mouth daily.   Past Week   cyanocobalamin  (,VITAMIN B-12,) 1000 MCG/ML injection Inject 1,000 mcg into the muscle every 30 (thirty) days.   Past Week   divalproex  (DEPAKOTE  ER) 250 MG 24 hr tablet Take 750 mg by mouth at bedtime.   07/01/2024   FLUoxetine  (PROZAC ) 20 MG capsule Take 60 mg by mouth daily.    07/01/2024   furosemide  (LASIX ) 20 MG tablet Take 20 mg by mouth as needed for edema.   Past Week   montelukast  (SINGULAIR ) 10 MG tablet Take 10 mg by mouth daily.   07/01/2024   morphine  (MS CONTIN ) 15 MG 12 hr tablet Limit 2-3 tabs by mouth per day if tolerated 90 tablet 0 07/02/2024 at  3:00 AM   potassium chloride  SA (K-DUR,KLOR-CON ) 20 MEQ tablet Take 20 mEq by mouth daily as needed (when taking furosemide ).   Past Week   predniSONE  (DELTASONE ) 5 MG tablet Take 5 mg by mouth daily.   07/01/2024   primidone  (MYSOLINE ) 50 MG tablet Take 50 mg by mouth at bedtime.   07/01/2024   rOPINIRole  (REQUIP ) 1 MG tablet Take 1 mg by  mouth at bedtime.    07/01/2024   senna (SENOKOT) 8.6 MG TABS tablet Take 1 tablet (8.6 mg total) by mouth 2 (two) times daily. 120 tablet 0 07/01/2024   SUMAtriptan  (IMITREX ) 100 MG tablet Take 100 mg by mouth every 2 (two) hours as needed for migraine.   Past Week   topiramate  (TOPAMAX ) 100 MG tablet Take 100 mg by mouth 2 (two) times daily.   07/01/2024   Vitamin D , Ergocalciferol , (DRISDOL ) 50000 units CAPS capsule Take 50,000 Units by mouth every 7 (seven) days.   Past Week   amphetamine-dextroamphetamine (ADDERALL XR) 30 MG 24 hr capsule Take 30 mg by mouth daily.      clobetasol  cream (TEMOVATE ) 0.05 % Apply twice daily to hands  up to 2 weeks as needed 45 g 2    diclofenac sodium (VOLTAREN) 1 % GEL Apply 2 g topically daily as needed (pain).      docusate sodium  (COLACE) 100 MG capsule Take 1 capsule (100 mg total) by mouth 2 (two) times daily. 10 capsule 0    esomeprazole (NEXIUM) 40 MG capsule Take 40 mg by mouth in the morning.      famotidine  (PEPCID ) 40 MG tablet Take 40 mg by mouth at bedtime.      Fluticasone -Salmeterol (ADVAIR) 100-50 MCG/DOSE AEPB Inhale 1 puff into the lungs in the morning and at bedtime.      naloxone  (NARCAN ) 4 MG/0.1ML LIQD nasal spray kit Place 0.4 mg into the nose once.      ondansetron  (ZOFRAN -ODT) 4 MG disintegrating tablet Take 4 mg by mouth every 8 (eight) hours as needed for nausea or vomiting.      oxyCODONE -acetaminophen  (PERCOCET) 10-325 MG tablet Take 1 tablet by mouth every 4 (four) hours as needed.      triamcinolone  cream (KENALOG ) 0.1 % Apply 1 application topically See admin instructions. Apply nightly to legs Monday - Friday only. Avoid applying to face, groin, and axilla. Use as directed. 453.6 g 2      Allergies  Allergen Reactions   Asa [Aspirin]     Stomach pain   Codeine     Other reaction(s): Diarrhea and vomiting (finding)   Cyclobenzaprine Other (See Comments)    Other reaction(s): Altered mental status (finding), Unknown   Darvocet [Propoxyphene N-Acetaminophen ]     Stomach pain   Effexor [Venlafaxine Hcl] Other (See Comments)    hallucinations   Erythromycin Other (See Comments)    Other reaction(s): Distress (finding)   Meperidine-Promethazine     Other reaction(s): Other (qualifier value) Causes internal itching   Neurontin [Gabapentin] Other (See Comments)    fatigue   Nsaids     Bad stomach ache    Propoxyphene     Other reaction(s): Other (qualifier value) causes constipation   Tolmetin Other (See Comments)    BAN STOMACH ACHE   Mobic [Meloxicam] Palpitations   Penicillins Rash     Past Medical History:  Diagnosis Date   Abnormal  mammogram, unspecified    Alcoholism (HCC)    Allergic state    Anemia    Anxiety    Arthritis    Atherosclerotic peripheral vascular disease with intermittent claudication    B12 deficiency    Bilateral breast cysts    Bipolar 1 disorder (HCC)    Bone marrow disorder    Bronchitis    Cervical stenosis of spine    Cervicalgia    Chronic cough    Chronic diarrhea    Chronic fatigue disorder  Chronic pain    Chronic venous insufficiency    Cigarette smoker    Collapse of right lung    COPD (chronic obstructive pulmonary disease) (HCC)    COVID-19 05/2020   DDD (degenerative disc disease), lumbar    Depression    Drug-induced tremor    Dyspnea    Eczema    Esophageal reflux    Facet syndrome, lumbar    Family history of adverse reaction to anesthesia    Mom was difficult to wake up   Fibromyalgia    Gastroesophageal reflux disease without esophagitis    GERD (gastroesophageal reflux disease)    Headache    Hiatal hernia    History of esophagogastroduodenoscopy (EGD)    Hyperlipidemia    Hyperlipidemia    Hypertension    Hypothyroidism    patient denies   Insomnia    Intractable chronic migraine without aura and without status migrainosus    Intractable chronic migraine without aura and without status migrainosus    Lithium toxicity    Lymphedema of both lower extremities    Menstrual irregularity    Mild cognitive impairment    Multiple falls    Myalgia    Neuropathy    lower legs   Osteopenia    Panic disorder    Pleurisy    Pneumonia    Psoriasis    PVD (peripheral vascular disease)    Sacroiliac joint dysfunction    Sensory polyneuropathy    Sleep apnea    No CPAP   Spondylosis of cervical spine    Stenosis of cervical spine region 2025   Tobacco abuse    UTI (urinary tract infection)    Vitamin D  deficiency    Wears dentures    partial lower    Review of systems:  Otherwise negative.    Physical Exam  Gen: Alert, oriented. Appears  stated age.  HEENT: [place]/AT. PERRLA. Lungs: CTA, no wheezes. CV: RR nl S1, S2. Abd: soft, benign, no masses. BS+ Ext: No edema. Pulses 2+    Planned procedures: Proceed with EGD. The patient understands the nature of the planned procedure, indications, risks, alternatives and potential complications including but not limited to bleeding, infection, perforation, damage to internal organs and possible oversedation/side effects from anesthesia. The patient agrees and gives consent to proceed.  Please refer to procedure notes for findings, recommendations and patient disposition/instructions.     Sabrina Stanley, M.D. Gastroenterology 07/02/2024  10:20 AM

## 2024-07-02 NOTE — Anesthesia Preprocedure Evaluation (Signed)
 Anesthesia Evaluation  Patient identified by MRN, date of birth, ID band Patient awake    Reviewed: Allergy & Precautions, H&P , NPO status , Patient's Chart, lab work & pertinent test results, reviewed documented beta blocker date and time   Airway Mallampati: II   Neck ROM: full    Dental  (+) Poor Dentition   Pulmonary shortness of breath and with exertion, sleep apnea , pneumonia, resolved, COPD,  COPD inhaler, Current Smoker   Pulmonary exam normal        Cardiovascular Exercise Tolerance: Poor hypertension, + Peripheral Vascular Disease  Normal cardiovascular exam Rhythm:regular Rate:Normal     Neuro/Psych  Headaches PSYCHIATRIC DISORDERS Anxiety Depression Bipolar Disorder    Neuromuscular disease    GI/Hepatic Neg liver ROS, hiatal hernia,GERD  Medicated,,  Endo/Other  Hypothyroidism    Renal/GU negative Renal ROS  negative genitourinary   Musculoskeletal   Abdominal   Peds  Hematology  (+) Blood dyscrasia, anemia   Anesthesia Other Findings Past Medical History: No date: Abnormal mammogram, unspecified No date: Alcoholism (HCC) No date: Allergic state No date: Anemia No date: Anxiety No date: Arthritis No date: Atherosclerotic peripheral vascular disease with  intermittent claudication No date: B12 deficiency No date: Bilateral breast cysts No date: Bipolar 1 disorder (HCC) No date: Bone marrow disorder No date: Bronchitis No date: Cervical stenosis of spine No date: Cervicalgia No date: Chronic cough No date: Chronic diarrhea No date: Chronic fatigue disorder No date: Chronic pain No date: Chronic venous insufficiency No date: Cigarette smoker No date: Collapse of right lung No date: COPD (chronic obstructive pulmonary disease) (HCC) 05/2020: COVID-19 No date: DDD (degenerative disc disease), lumbar No date: Depression No date: Drug-induced tremor No date: Dyspnea No date: Eczema No  date: Esophageal reflux No date: Facet syndrome, lumbar No date: Family history of adverse reaction to anesthesia     Comment:  Mom was difficult to wake up No date: Fibromyalgia No date: Gastroesophageal reflux disease without esophagitis No date: GERD (gastroesophageal reflux disease) No date: Headache No date: Hiatal hernia No date: History of esophagogastroduodenoscopy (EGD) No date: Hyperlipidemia No date: Hyperlipidemia No date: Hypertension No date: Hypothyroidism     Comment:  patient denies No date: Insomnia No date: Intractable chronic migraine without aura and without status  migrainosus No date: Intractable chronic migraine without aura and without status  migrainosus No date: Lithium toxicity No date: Lymphedema of both lower extremities No date: Menstrual irregularity No date: Mild cognitive impairment No date: Multiple falls No date: Myalgia No date: Neuropathy     Comment:  lower legs No date: Osteopenia No date: Panic disorder No date: Pleurisy No date: Pneumonia No date: Psoriasis No date: PVD (peripheral vascular disease) No date: Sacroiliac joint dysfunction No date: Sensory polyneuropathy No date: Sleep apnea     Comment:  No CPAP No date: Spondylosis of cervical spine 2025: Stenosis of cervical spine region No date: Tobacco abuse No date: UTI (urinary tract infection) No date: Vitamin D  deficiency No date: Wears dentures     Comment:  partial lower Past Surgical History: 1996: ABDOMINAL HYSTERECTOMY No date: ADENOIDECTOMY No date: BACK SURGERY 08/21/2012: BREAST BIOPSY; Right     Comment:  FIBROADENOMATOUS CHANGE WITH CALCIFICATIONS.  No date: BREAST BIOPSY; Right     Comment:  FIBROUS SCARRING, negative for atypia 11/16/2020: CATARACT EXTRACTION W/PHACO; Right     Comment:  Procedure: CATARACT EXTRACTION PHACO AND INTRAOCULAR  LENS PLACEMENT (IOC) RIGHT;  Surgeon: Myrna Adine Anes,              MD;  Location: [facility];  Service:               Ophthalmology;  Laterality: Right;  0.77 0:12.9 12/14/2020: CATARACT EXTRACTION W/PHACO; Left     Comment:  Procedure: CATARACT EXTRACTION PHACO AND INTRAOCULAR               LENS PLACEMENT (IOC) LEFT;  Surgeon: Myrna Adine Anes,               MD;  Location: [facility];  Service:               Ophthalmology;  Laterality: Left;  1.25 0:12.0 No date: cervical discectomy and fusion No date: COLONOSCOPY 04/27/2022: COLONOSCOPY; N/A     Comment:  Procedure: COLONOSCOPY;  Surgeon: Toledo, Ladell POUR, MD;              Location: [facility] ENDOSCOPY;  Service: Gastroenterology;                Laterality: N/A; 1979, 1980: DILATION AND CURETTAGE OF UTERUS No date: ESOPHAGOGASTRODUODENOSCOPY 09/17/2015: ESOPHAGOGASTRODUODENOSCOPY; N/A     Comment:  Procedure: ESOPHAGOGASTRODUODENOSCOPY (EGD);  Surgeon:               Deward CINDERELLA Piedmont, MD;  Location: [facility] ENDOSCOPY;  Service:               Gastroenterology;  Laterality: N/A; 04/27/2022: ESOPHAGOGASTRODUODENOSCOPY; N/A     Comment:  Procedure: ESOPHAGOGASTRODUODENOSCOPY (EGD);  Surgeon:               Toledo, Ladell POUR, MD;  Location: [facility] ENDOSCOPY;                Service: Gastroenterology;  Laterality: N/A; 2010: EYE SURGERY 1998: KNEE SURGERY; Right     Comment:  meniscus repair 1998, 2000: NASAL SINUS SURGERY No date: NECK SURGERY     Comment:  x 2 No date: OOPHORECTOMY; Right 12/05/2023: POSTERIOR CERVICAL FUSION/FORAMINOTOMY; N/A     Comment:  Procedure: POSTERIOR CERVICAL FUSION/FORAMINOTOMY LEVEL               3;  Surgeon: Claudene Penne ORN, MD;  Location: [facility] ORS;                Service: Neurosurgery;  Laterality: N/A;  C3-6 POSTERIOR               SPINAL FUSION 12/05/2023: POSTERIOR CERVICAL LAMINECTOMY; N/A     Comment:  Procedure: POSTERIOR CERVICAL LAMINECTOMY;  Surgeon:               Claudene Penne ORN, MD;  Location: [facility] ORS;  Service:               Neurosurgery;  Laterality: N/A;  C3-4  LAMINECTOMY 2006, 2008: SPINE SURGERY     Comment:  cervical fusion No date: SUBMUSCULAR ULNAR NERVE TRANSPOSITION; Bilateral No date: TONSILLECTOMY   Reproductive/Obstetrics negative OB ROS                              Anesthesia Physical Anesthesia Plan  ASA: 3  Anesthesia Plan: General   Post-op Pain Management:    Induction:   PONV Risk Score and Plan:   Airway Management Planned:   Additional Equipment:   Intra-op Plan:   Post-operative Plan:  Informed Consent: I have reviewed the patients History and Physical, chart, labs and discussed the procedure including the risks, benefits and alternatives for the proposed anesthesia with the patient or authorized representative who has indicated his/her understanding and acceptance.     Dental Advisory Given  Plan Discussed with: CRNA  Anesthesia Plan Comments:         Anesthesia Quick Evaluation

## 2024-07-04 LAB — SURGICAL PATHOLOGY

## 2024-07-23 ENCOUNTER — Other Ambulatory Visit: Payer: Self-pay | Admitting: Internal Medicine

## 2024-07-23 DIAGNOSIS — R0602 Shortness of breath: Secondary | ICD-10-CM

## 2024-07-23 DIAGNOSIS — R0789 Other chest pain: Secondary | ICD-10-CM

## 2024-07-23 DIAGNOSIS — Z0000 Encounter for general adult medical examination without abnormal findings: Secondary | ICD-10-CM

## 2024-07-29 ENCOUNTER — Other Ambulatory Visit: Payer: Self-pay | Admitting: Internal Medicine

## 2024-07-29 DIAGNOSIS — R519 Headache, unspecified: Secondary | ICD-10-CM

## 2024-07-30 ENCOUNTER — Ambulatory Visit

## 2024-08-01 ENCOUNTER — Ambulatory Visit
Admission: RE | Admit: 2024-08-01 | Discharge: 2024-08-01 | Disposition: A | Source: Ambulatory Visit | Attending: Internal Medicine | Admitting: Internal Medicine

## 2024-08-01 DIAGNOSIS — Z0000 Encounter for general adult medical examination without abnormal findings: Secondary | ICD-10-CM | POA: Insufficient documentation

## 2024-08-01 DIAGNOSIS — R0789 Other chest pain: Secondary | ICD-10-CM | POA: Diagnosis present

## 2024-08-01 DIAGNOSIS — R519 Headache, unspecified: Secondary | ICD-10-CM | POA: Diagnosis present

## 2024-08-01 DIAGNOSIS — R0602 Shortness of breath: Secondary | ICD-10-CM | POA: Diagnosis present

## 2024-10-04 ENCOUNTER — Other Ambulatory Visit: Payer: Self-pay | Admitting: Physician Assistant

## 2024-10-04 ENCOUNTER — Ambulatory Visit: Admission: RE | Admit: 2024-10-04 | Source: Ambulatory Visit

## 2024-10-04 DIAGNOSIS — S32020A Wedge compression fracture of second lumbar vertebra, initial encounter for closed fracture: Secondary | ICD-10-CM
